# Patient Record
Sex: Female | Born: 1955
Health system: Southern US, Community
[De-identification: ages and names within clinical notes are randomized; demographics above are authoritative.]

## PROBLEM LIST (undated history)

## (undated) DIAGNOSIS — M51369 Other intervertebral disc degeneration, lumbar region without mention of lumbar back pain or lower extremity pain: Secondary | ICD-10-CM

## (undated) DIAGNOSIS — E119 Type 2 diabetes mellitus without complications: Secondary | ICD-10-CM

## (undated) DIAGNOSIS — K649 Unspecified hemorrhoids: Secondary | ICD-10-CM

## (undated) DIAGNOSIS — M5136 Other intervertebral disc degeneration, lumbar region: Secondary | ICD-10-CM

## (undated) DIAGNOSIS — R112 Nausea with vomiting, unspecified: Secondary | ICD-10-CM

## (undated) DIAGNOSIS — G8929 Other chronic pain: Secondary | ICD-10-CM

## (undated) DIAGNOSIS — Z860100 Personal history of colon polyps, unspecified: Secondary | ICD-10-CM

## (undated) DIAGNOSIS — Z9889 Other specified postprocedural states: Secondary | ICD-10-CM

## (undated) DIAGNOSIS — J45909 Unspecified asthma, uncomplicated: Secondary | ICD-10-CM

## (undated) DIAGNOSIS — E785 Hyperlipidemia, unspecified: Secondary | ICD-10-CM

## (undated) DIAGNOSIS — S83206A Unspecified tear of unspecified meniscus, current injury, right knee, initial encounter: Secondary | ICD-10-CM

## (undated) DIAGNOSIS — M1990 Unspecified osteoarthritis, unspecified site: Secondary | ICD-10-CM

## (undated) DIAGNOSIS — M549 Dorsalgia, unspecified: Secondary | ICD-10-CM

## (undated) DIAGNOSIS — K219 Gastro-esophageal reflux disease without esophagitis: Secondary | ICD-10-CM

## (undated) DIAGNOSIS — I10 Essential (primary) hypertension: Secondary | ICD-10-CM

## (undated) DIAGNOSIS — M797 Fibromyalgia: Secondary | ICD-10-CM

## (undated) HISTORY — PX: OTHER SURGICAL HISTORY: SHX169

---

## 1977-09-22 HISTORY — PX: TOTAL ABDOMINAL HYSTERECTOMY W/ BILATERAL SALPINGOOPHORECTOMY: SHX83

## 1999-11-12 ENCOUNTER — Other Ambulatory Visit: Admission: RE | Admit: 1999-11-12 | Discharge: 1999-11-12 | Payer: Self-pay | Admitting: *Deleted

## 2000-09-08 ENCOUNTER — Encounter: Admission: RE | Admit: 2000-09-08 | Discharge: 2000-09-08 | Payer: Self-pay | Admitting: *Deleted

## 2000-09-08 ENCOUNTER — Encounter: Payer: Self-pay | Admitting: *Deleted

## 2000-12-03 ENCOUNTER — Other Ambulatory Visit: Admission: RE | Admit: 2000-12-03 | Discharge: 2000-12-03 | Payer: Self-pay | Admitting: *Deleted

## 2001-03-22 ENCOUNTER — Encounter: Payer: Self-pay | Admitting: Cardiology

## 2001-03-22 ENCOUNTER — Encounter: Admission: RE | Admit: 2001-03-22 | Discharge: 2001-03-22 | Payer: Self-pay | Admitting: Cardiology

## 2001-10-05 ENCOUNTER — Encounter: Payer: Self-pay | Admitting: *Deleted

## 2001-10-05 ENCOUNTER — Encounter: Admission: RE | Admit: 2001-10-05 | Discharge: 2001-10-05 | Payer: Self-pay | Admitting: *Deleted

## 2001-10-11 ENCOUNTER — Ambulatory Visit (HOSPITAL_COMMUNITY): Admission: RE | Admit: 2001-10-11 | Discharge: 2001-10-11 | Payer: Self-pay | Admitting: Urology

## 2001-10-11 ENCOUNTER — Encounter: Payer: Self-pay | Admitting: Urology

## 2001-12-22 ENCOUNTER — Other Ambulatory Visit: Admission: RE | Admit: 2001-12-22 | Discharge: 2001-12-22 | Payer: Self-pay | Admitting: *Deleted

## 2002-10-19 ENCOUNTER — Ambulatory Visit (HOSPITAL_COMMUNITY): Admission: RE | Admit: 2002-10-19 | Discharge: 2002-10-19 | Payer: Self-pay | Admitting: Gastroenterology

## 2002-11-08 ENCOUNTER — Encounter: Admission: RE | Admit: 2002-11-08 | Discharge: 2002-11-08 | Payer: Self-pay | Admitting: *Deleted

## 2002-11-08 ENCOUNTER — Encounter: Payer: Self-pay | Admitting: *Deleted

## 2003-02-21 ENCOUNTER — Other Ambulatory Visit: Admission: RE | Admit: 2003-02-21 | Discharge: 2003-02-21 | Payer: Self-pay | Admitting: *Deleted

## 2004-01-15 ENCOUNTER — Encounter: Admission: RE | Admit: 2004-01-15 | Discharge: 2004-01-15 | Payer: Self-pay | Admitting: *Deleted

## 2004-03-28 ENCOUNTER — Other Ambulatory Visit: Admission: RE | Admit: 2004-03-28 | Discharge: 2004-03-28 | Payer: Self-pay | Admitting: *Deleted

## 2005-03-27 ENCOUNTER — Other Ambulatory Visit: Admission: RE | Admit: 2005-03-27 | Discharge: 2005-03-27 | Payer: Self-pay | Admitting: *Deleted

## 2005-06-03 ENCOUNTER — Ambulatory Visit (HOSPITAL_COMMUNITY): Admission: RE | Admit: 2005-06-03 | Discharge: 2005-06-03 | Payer: Self-pay | Admitting: Orthopedic Surgery

## 2005-06-03 ENCOUNTER — Ambulatory Visit (HOSPITAL_BASED_OUTPATIENT_CLINIC_OR_DEPARTMENT_OTHER): Admission: RE | Admit: 2005-06-03 | Discharge: 2005-06-03 | Payer: Self-pay | Admitting: Orthopedic Surgery

## 2006-01-16 ENCOUNTER — Encounter: Admission: RE | Admit: 2006-01-16 | Discharge: 2006-01-16 | Payer: Self-pay | Admitting: Cardiology

## 2006-01-30 ENCOUNTER — Ambulatory Visit (HOSPITAL_BASED_OUTPATIENT_CLINIC_OR_DEPARTMENT_OTHER): Admission: RE | Admit: 2006-01-30 | Discharge: 2006-01-30 | Payer: Self-pay | Admitting: Orthopedic Surgery

## 2006-01-30 HISTORY — PX: OTHER SURGICAL HISTORY: SHX169

## 2006-03-30 ENCOUNTER — Other Ambulatory Visit: Admission: RE | Admit: 2006-03-30 | Discharge: 2006-03-30 | Payer: Self-pay | Admitting: *Deleted

## 2006-10-14 ENCOUNTER — Ambulatory Visit (HOSPITAL_BASED_OUTPATIENT_CLINIC_OR_DEPARTMENT_OTHER): Admission: RE | Admit: 2006-10-14 | Discharge: 2006-10-14 | Payer: Self-pay | Admitting: Orthopedic Surgery

## 2006-10-14 HISTORY — PX: KNEE ARTHROSCOPY: SUR90

## 2006-12-18 ENCOUNTER — Observation Stay (HOSPITAL_COMMUNITY): Admission: EM | Admit: 2006-12-18 | Discharge: 2006-12-19 | Payer: Self-pay | Admitting: Emergency Medicine

## 2006-12-27 ENCOUNTER — Encounter: Admission: RE | Admit: 2006-12-27 | Discharge: 2006-12-27 | Payer: Self-pay | Admitting: Cardiology

## 2007-04-06 ENCOUNTER — Other Ambulatory Visit: Admission: RE | Admit: 2007-04-06 | Discharge: 2007-04-06 | Payer: Self-pay | Admitting: *Deleted

## 2007-07-07 ENCOUNTER — Ambulatory Visit (HOSPITAL_BASED_OUTPATIENT_CLINIC_OR_DEPARTMENT_OTHER): Admission: RE | Admit: 2007-07-07 | Discharge: 2007-07-07 | Payer: Self-pay | Admitting: Orthopedic Surgery

## 2007-07-07 HISTORY — PX: SHOULDER ARTHROSCOPY W/ SUBACROMIAL DECOMPRESSION AND DISTAL CLAVICLE EXCISION: SHX2401

## 2007-09-10 ENCOUNTER — Encounter: Admission: RE | Admit: 2007-09-10 | Discharge: 2007-09-10 | Payer: Self-pay | Admitting: Cardiology

## 2007-12-12 ENCOUNTER — Encounter: Admission: RE | Admit: 2007-12-12 | Discharge: 2007-12-12 | Payer: Self-pay | Admitting: Orthopedic Surgery

## 2008-01-10 HISTORY — PX: COLONOSCOPY W/ POLYPECTOMY: SHX1380

## 2008-04-24 ENCOUNTER — Other Ambulatory Visit: Admission: RE | Admit: 2008-04-24 | Discharge: 2008-04-24 | Payer: Self-pay | Admitting: Gynecology

## 2008-07-23 ENCOUNTER — Encounter: Admission: RE | Admit: 2008-07-23 | Discharge: 2008-07-23 | Payer: Self-pay | Admitting: Orthopedic Surgery

## 2008-07-26 ENCOUNTER — Encounter: Admission: RE | Admit: 2008-07-26 | Discharge: 2008-07-26 | Payer: Self-pay | Admitting: Orthopedic Surgery

## 2008-10-04 ENCOUNTER — Ambulatory Visit (HOSPITAL_COMMUNITY): Admission: RE | Admit: 2008-10-04 | Discharge: 2008-10-04 | Payer: Self-pay | Admitting: Urology

## 2008-10-16 ENCOUNTER — Encounter (INDEPENDENT_AMBULATORY_CARE_PROVIDER_SITE_OTHER): Payer: Self-pay | Admitting: Urology

## 2008-10-16 ENCOUNTER — Inpatient Hospital Stay (HOSPITAL_COMMUNITY): Admission: RE | Admit: 2008-10-16 | Discharge: 2008-10-19 | Payer: Self-pay | Admitting: Urology

## 2008-10-16 HISTORY — PX: ROBOTIC ASSITED PARTIAL NEPHRECTOMY: SHX6087

## 2010-08-07 ENCOUNTER — Encounter: Admission: RE | Admit: 2010-08-07 | Discharge: 2010-08-07 | Payer: Self-pay | Admitting: Cardiology

## 2011-01-06 LAB — BASIC METABOLIC PANEL
BUN: 10 mg/dL (ref 6–23)
CO2: 27 mEq/L (ref 19–32)
CO2: 30 mEq/L (ref 19–32)
Calcium: 8.4 mg/dL (ref 8.4–10.5)
Calcium: 8.8 mg/dL (ref 8.4–10.5)
Chloride: 103 mEq/L (ref 96–112)
Chloride: 104 mEq/L (ref 96–112)
Creatinine, Ser: 0.71 mg/dL (ref 0.4–1.2)
Creatinine, Ser: 0.81 mg/dL (ref 0.4–1.2)
Creatinine, Ser: 0.82 mg/dL (ref 0.4–1.2)
Creatinine, Ser: 0.85 mg/dL (ref 0.4–1.2)
GFR calc Af Amer: >60 mL/min (ref >60)
GFR calc Af Amer: >60 mL/min (ref >60)
GFR calc Af Amer: >60 mL/min (ref >60)
GFR calc non Af Amer: >60 mL/min (ref >60)
Glucose, Bld: 104 mg/dL — ABNORMAL HIGH (ref 70–99)
Glucose, Bld: 138 mg/dL — ABNORMAL HIGH (ref 70–99)
Glucose, Bld: 140 mg/dL — ABNORMAL HIGH (ref 70–99)
Potassium: 3.7 mEq/L (ref 3.5–5.1)
Sodium: 141 mEq/L (ref 135–145)

## 2011-01-06 LAB — GLUCOSE, CAPILLARY
Glucose-Capillary: 114 mg/dL — ABNORMAL HIGH (ref 70–99)
Glucose-Capillary: 131 mg/dL — ABNORMAL HIGH (ref 70–99)
Glucose-Capillary: 143 mg/dL — ABNORMAL HIGH (ref 70–99)
Glucose-Capillary: 149 mg/dL — ABNORMAL HIGH (ref 70–99)
Glucose-Capillary: 155 mg/dL — ABNORMAL HIGH (ref 70–99)
Glucose-Capillary: 166 mg/dL — ABNORMAL HIGH (ref 70–99)
Glucose-Capillary: 97 mg/dL (ref 70–99)

## 2011-01-06 LAB — CBC
Hemoglobin: 12.7 g/dL (ref 12.0–15.0)
MCV: 94.2 fL (ref 78.0–100.0)
RDW: 13.1 % (ref 11.5–15.5)
WBC: 6.3 10*3/uL (ref 4.0–10.5)

## 2011-01-06 LAB — ABO/RH: ABO/RH(D): O POS

## 2011-01-06 LAB — HEMOGLOBIN AND HEMATOCRIT, BLOOD
HCT: 29.7 % — ABNORMAL LOW (ref 36.0–46.0)
HCT: 30.2 % — ABNORMAL LOW (ref 36.0–46.0)
HCT: 34 % — ABNORMAL LOW (ref 36.0–46.0)
Hemoglobin: 10.1 g/dL — ABNORMAL LOW (ref 12.0–15.0)

## 2011-01-06 LAB — TYPE AND SCREEN

## 2011-01-06 LAB — CREATININE, FLUID (PLEURAL, PERITONEAL, JP DRAINAGE): Creat, Fluid: 1 mg/dL

## 2011-02-04 NOTE — Op Note (Signed)
[name], [name]               ACCOUNT NO.:  [id number]   MEDICAL RECORD NO.:  [id number]          PATIENT TYPE:  AMB   LOCATION:  DSC                          FACILITY:  [facility]   PHYSICIAN:  Mila Homer. Sherlean Foot, M.D. DATE OF BIRTH:  01/17/1956   DATE OF PROCEDURE:  07/07/2007  DATE OF DISCHARGE:                               OPERATIVE REPORT   SURGEON:  Mila Homer. Sherlean Foot, M.D.   ASSISTANT:  None.   ANESTHESIA:  General.   PREOPERATIVE DIAGNOSIS:  Left shoulder impingement syndrome.   POSTOPERATIVE DIAGNOSIS:  Right rotator cuff tear and labral tear, plus  acromioclavicular joint arthritis and impingement syndrome.   PROCEDURE:  Left shoulder scope, glenohumeral debridement, subacromial  decompression, distal clavicle resection and rotator cuff repair.   INDICATIONS FOR PROCEDURE:  The patient is a 55 year old black female  with failure of conservative measures, MRI evidence of partial-thickness  tearing of the rotator cuff and chronic impingement syndrome.  Informed  consent was obtained.   DESCRIPTION OF PROCEDURE:  The patient was laid supine and administered  general anesthesia and placed in beach-chair position.  Left shoulder  prepped and draped in usual sterile fashion.  Anterior-posterior direct  lateral portals were created with a #11 blade blunt trocar and cannula.  Diagnostic arthroscopy revealed no chondromalacia in the joint but a  degenerative labral tear.  This was debrided.  There was also tear of at  least one third of the biceps tendon with lots of fraying.  This was  debrided as well.  I then debrided the undersurface of the rotator cuff  and it was a very extensive tear concerning for a full-thickness tear  but I would further evaluate through the bursal side.  I then redirected  scope into the subacromial space from the posterior portal.  I performed  bursectomy and acromioplasty with a 4 mm cylindrical bur.  I removed  distal clavicle with 4 mm bur as  well.  I then debrided the rotator  cuff.  It was obvious there was actually quite large rotator cuff tear  at the interval.  There was wear on the biceps.  It was really an L-  shaped type tear.  I then converted to mini open technique by extending  the direct lateral portal up 3 cm.  Placed Arthrex shoulder retractor in  place.  Then placed two side-to-side sutures with #2 Vicryl sutures.  These were figure-of-eight sutures.  I then placed a 5.5 mm biocorkscrew  from Arthrex and placed a modified ONEOK bringing the leading edge  of the supraspinatus back down.  I then placed one additional stitch  from that edge to the anterior edge closing  the rotator cuff interval.  This afforded excellent repair, I then irrigated and closed the deltoid  interval with two figure-of-eight 0 Vicryl sutures, subcuticular with 2-  0 Vicryl sutures and then Steri-Strips and that incision, 4-0 nylon in  the portals.  I dressed with Xeroform dressing sponges, 2-inch silk tape  and a sling and swath.   COMPLICATIONS:  None.   DRAINS:  None.  ______________________________  Mila Homer Sherlean Foot, M.D.     SDL/MEDQ  D:  07/07/2007  T:  07/08/2007  Job:  [id number]

## 2011-02-04 NOTE — Discharge Summary (Signed)
[name], [name] NO.:  [id number]   MEDICAL RECORD NO.:  [id number]          PATIENT TYPE:  INP   LOCATION:  1436                         FACILITY:  [facility]   PHYSICIAN:  Heloise Purpura, MD      DATE OF BIRTH:  [birth year]   DATE OF ADMISSION:  10/16/2008  DATE OF DISCHARGE:  10/19/2008                               DISCHARGE SUMMARY   ADMISSION DIAGNOSIS:  Left renal mass.   DISCHARGE DIAGNOSIS:  Left renal oncocytoma.   PROCEDURE:  Left robotic-assisted laparoscopic partial nephrectomy.   HISTORY AND PHYSICAL EXAM:  For full details please see admission  history and physical.  Briefly, [name] is a 55 year old female who  has developed back pain and was evaluated for this with an MRI of the  spine.  She was incidentally found to have a small solid left renal mass  concerning  for possible renal malignancy.  She underwent further  evaluation with a definitive renal MRI which did demonstrate enhancement  of this lesion that was concerning  for renal cell carcinoma.  After  discussion regarding management options for treatment of her left renal  mass, she elected to proceed with a minimally invasive, nephron-sparing  surgical approach.   HOSPITAL COURSE:  On October 16, 2008 [name] was taken to the  operating room where she underwent a robotic-assisted laparoscopic left  partial nephrectomy.  This procedure was tolerated well and without  complication.  Postoperatively she was able to be transferred to a  regular hospital room, where she was monitored overnight and remained  hemodynamically stable.  Her hematocrit was 34.  She was maintained on  strict bedrest for 24 hours.  Her kidney function remained normal  postoperatively as evidenced by her creatinine of 0.86.  she began  ambulating after bedrest for 24 hours which she did without difficulty.  She was also transitioned to her oral pain medications.  She maintained  excellent urine output for  the first 24 hours after surgery.  Therefore,  the Foley catheter was removed.  She had minimal output from the  perinephric drain.  Fluid from this drain was sent on postoperative day  #2 to check for creatinine level and found to be consistent with serum  at 1.  Fluid load did increase on postoperative day #2 from the  perinephric drain.  Therefore, another sample as sent to check for  creatinine level and again came back to be consistent with serum at 0.9.  She was able to begin a clear liquid diet on postoperative day #1 which  she tolerated well in the a.m. Afterwards for the next 2 postoperative  days she did have somewhat of a postoperative ileus.  She was treated  for  this with suppositories, Reglan, Milk of Magnesia, and enemas.  She  did eventually have a small emesis and then began passing flatus. At  this time she was able to advance her diet and was able to tolerate the  diet without any further complications.   Postoperatively she was found to be slightly hypokalemic.  Therefore,  potassium replacement was  begun,  and this was ultimately resolved.  She  was placed on sliding scale insulin for her diabetes mellitus, which is  currently diet controlled.   On postoperative day #3 she was tolerating her diet, ambulating without  difficulty, and her pain was well controlled.  Therefore, she was able  to be discharged home in excellent condition.   DISPOSITION:  Home.   DISCHARGE MEDICATIONS:  She was given a prescription of Vicodin to use  as needed for pain and told to use Colace as a stool softener.  She was  instructed to hold all herbal supplements, multivitamins, and aspirin  for 10 days postoperatively.   DISCHARGE INSTRUCTIONS:  She was instructed to be ambulatory but  specifically told to refrain from any heavy lifting, strenuous activity,  or driving.  She was instructed to gradually resume her diet.   FOLLOW UP:  She will return in 10-14 days for further  postoperative  evaluation in the office.   PATHOLOGY:  Her pathology returned as a benign renal oncocytoma.  The  pathology results were discuss with the patient that this was a benign  lesion.      Delia Chimes, NP      Heloise Purpura, MD  Electronically Signed    MA/MEDQ  D:  10/19/2008  T:  10/19/2008  Job:  [id number]

## 2011-02-04 NOTE — Op Note (Signed)
[name], KOFOED NO.:  [id number]   MEDICAL RECORD NO.:  [id number]          PATIENT TYPE:  INP   LOCATION:  0010                         FACILITY:  [facility]   PHYSICIAN:  Heloise Purpura, MD      DATE OF BIRTH:  May 26, 1956   DATE OF PROCEDURE:  10/16/2008  DATE OF DISCHARGE:                               OPERATIVE REPORT   PREOPERATIVE DIAGNOSIS:  Left renal mass.   POSTOPERATIVE DIAGNOSIS:  Left renal mass.   PROCEDURE:  Left robotic-assisted laparoscopic partial nephrectomy.   FIRST ASSISTANT:  Bertram Millard. Dahlstedt, M.D.   SECOND ASSISTANT:  Delia Chimes, nurse practitioner.   ANESTHESIA:  General.   COMPLICATIONS:  None.   ESTIMATED BLOOD LOSS:  450 mL.   INTRAVENOUS FLUIDS:  2600 mL of lactated Ringer's.   WARM ISCHEMIA TIME:  26 minutes.   SPECIMENS:  1. Perinephric fat.  2. Left renal mass.  3. Tumor margin.   INTRAOPERATIVE FINDINGS:  Tumor margin was negative for malignancy.   DISPOSITION OF SPECIMENS:  To pathology.   DRAINS:  1. 16-French Foley catheter.  2. A #15 Blake perinephric drain.   INDICATION:  [name] is a 55 year old female who developed back pain  and was evaluated for this with an MRI of the spine.  She was  incidentally found to have a small solid left renal mass concerning for  possible renal malignancy.  She underwent further evaluation with a  definitive renal MRI which did demonstrate enhancement of this lesion  that was concerning for a renal cell carcinoma.  Her metastatic  evaluation was negative.  After discussion regarding management options  for treatment of her renal mass, she elected to proceed with a minimally  invasive nephron-sparing surgical approach.  The potential risks,  complications, and alternative options associated with the above  procedure were discussed in detail with the patient and informed consent  was obtained.   DESCRIPTION OF PROCEDURE:  The patient was taken to the operating  room  and a general anesthetic was administered.  She was given preoperative  antibiotics, placed in the left modified flank position, and prepped and  draped in the usual sterile fashion.  Care was taken to pad all  potential pressure points.  A preoperative time-out was performed.  A  site was then selected to the left of the umbilicus for placement of the  camera port.  This was placed using a standard open Hassan technique  which allowed entry into the peritoneal cavity under direct vision and  without difficulty.  A 12-mm port was then placed and a pneumoperitoneum  established.  The 0-degree lens was first used to inspect the abdomen  and there was no evidence for any intra-abdominal injuries or other  abnormalities.  The remaining ports were then placed.  Two 8-mm robotic  ports were placed in the left lower quadrant.  An 8-mm robotic port was  placed in the left upper quadrant.  A 12-mm port was placed in the upper  midline for laparoscopic assistance.  All ports were placed under direct  vision and without difficulty.  The surgical cart was then docked.  With  a combination of sharp and cautery dissection, the white line of Toldt  was incised along the length of the descending colon, allowing the colon  to be mobilized medially and the space between the mesocolon and the  anterior layer of Gerota's fascia to be developed.  The ureter was  exposed as dissection proceeded medially and the ureter was able to be  lifted off of the psoas muscle along with the kidney and perinephric  fat.  Dissection then proceeded superiorly and the lower pole renal  artery was encountered.  In addition, 2 large renal veins were  identified.  Between these veins, a single renal artery was identified  consistent with the patient's solitary renal artery on his MRI.  It  therefore appeared on intraoperative dissection that there were 2 renal  arteries with this lower pole branch also being identified.   The kidney  was then mobilized as the perinephric fat was separated off the renal  capsule.  The patient's lower pole lesion was readily identified on the  lateral aspect of the kidney and the kidney was rotated medially.  Once  the kidney had been adequately freed from the surrounding perinephric  attachments, preparations were made for resection.  Intravenous mannitol  12.5 g was administered.  The renal parenchyma was scored with cautery  around the lesion and the kidney was further mobilized so that the  lesion was felt to be in an ideal location for resection.  Once the  mannitol had been administered for over 10 minutes, and appropriate  bolsters and sutures placed into the abdomen, bulldog clamps were used  to clamp each of the aforementioned renal arteries.  Using cold scissor  dissection, the renal tumor was then excised with resection deep into  the renal parenchyma.  A margin from the deep base of the resection was  sent for frozen section analysis and returned negative for malignancy  and was consistent with normal medullary tissue.  There was noted to be  some excessive bleeding during the resection, raising the possibility of  poor vascular control.  This was controlled with 2-0 Vicryl running  sutures which were secured with Lapra-Ty.  This required two 2-0 Vicryl  running sutures to obtain vascular control.  At this point 5 mL of  FloSeal was placed into the renal defect with a Surgicel bolster over  the defect.  A double-armed 2-0 Monocryl suture was then brought through  the renal capsule and used to secure the Surgicel bolster in place with  adequate compression of the renal parenchyma and defect.  These  Monocryls were then secured with Hem-o-lok clips, allowing the Monocryl  to be cinched down in further compression of the renal parenchyma.  Lapra-Tys were then placed over the Hem-o-lok clips to secure the  Monocryl in place.  All needles were removed from the abdomen.   At this  point, vascular control appeared to be adequate.  The laparoscopic  bulldog clamps were then removed.  Total warm ischemia time was 26  minutes.  An additional 12.5 g of intravenous mannitol was then  administered.  The kidney and perinephric space was then reexamined and  hemostasis continued to appear excellent at this point.  A #15 Blake  drain was brought through the far left lateral lower quadrant port and  placed into the perinephric space and secured to the skin with a nylon  suture.  The perinephric fat specimen had been removed  earlier and was  removed off the top of the renal tumor.  The actual renal tumor was now  placed into an Endopouch retrieval bag for removal from the abdomen.  The pneumoperitoneum was let down and hemostasis continued to appear  excellent.  The surgical cart was then undocked.  The 12-mm laparoscopic  assistance port in the upper midline was closed with a 0 Vicryl suture  placed with the suture passer device.  All remaining ports were removed  under direct vision.  The specimen was then removed intact in the  Endopouch retrieval bag via the original camera port site.  This fascial  opening was then closed with a running 0 Vicryl suture.  All port sites  were injected with 0.25% Marcaine and  reapproximated at the skin level with staples.  Sterile dressings were  applied.  The patient appeared to tolerate the procedure well and  without complications.  She was able to be extubated and transferred to  recovery unit in satisfactory condition.      Heloise Purpura, MD  Electronically Signed     LB/MEDQ  D:  10/16/2008  T:  10/16/2008  Job:  [id number]

## 2011-02-07 NOTE — Discharge Summary (Signed)
[name], [name]               ACCOUNT NO.:  [id number]   MEDICAL RECORD NO.:  [id number]          PATIENT TYPE:  INP   LOCATION:  4735                         FACILITY:  [facility]   PHYSICIAN:  Mohan N. Sharyn Lull, M.D. DATE OF BIRTH:  10/22/1955   DATE OF ADMISSION:  12/18/2006  DATE OF DISCHARGE:  12/19/2006                               DISCHARGE SUMMARY   ADMITTING DIAGNOSES:  1. Atypical chest pain, palpitation, rule out coronary insufficiency,      rule out cardiac arrhythmias.  2. Hypertension.  3. Hypercholesterolemia.  4. History of tobacco abuse.  5. Positive family history of coronary artery disease.  6. Gastroesophageal reflux disease.  7. Hiatus hernia.  8. History of mitral valve prolapse.  9. History of irritable bowel syndrome.   DISCHARGE DIAGNOSES:  1. Status post atypical chest pain, negative Persantine Myoview.  2. Hypertension.  3. Hypercholesterolemia.  4. History of tobacco abuse.  5. Positive family history of coronary artery disease.  6. Gastroesophageal reflux disease.  7. Hiatus hernia.  8. History of mitral valve prolapse.  9. History of irritable bowel syndrome.   DISCHARGE MEDICATIONS:  1. Enteric-coated aspirin 81 mg one tablet daily.  2. Norvasc  5 mg tablet daily.  3. Os-Cal 1 tablet daily, as before.  4. Multivitamin 1 tablet daily as before.  5. Estrogen 1 tablet daily as before.  6. Pravachol 40 mg 1 tablet daily as before.  7. Nitrostat 0.4 mg use as directed.   DIET:  Low-salt, low-cholesterol.   FOLLOWUP:  Followup with Dr. Shana Chute in 1 week.   ACTIVITY:  As tolerated.   CONDITION ON DISCHARGE:  Stable.   BRIEF HISTORY AND HOSPITAL COURSE:  [name] is a 55 year old black  female with past medical history significant for hypertension,  hypercholesterolemia, a remote history of tobacco abuse, history of  hiatus hernia, GERD, IBS, history of mitral valve prolapse, positive  family history of coronary artery disease, history  of thyroid goiter.  She came to the ER complaining of retrosternal and left-sided chest pain  lasting a few minutes.  They are 5/10, sharp, without associated  symptoms.  Two days ago and last night, she felt palpitation associated  with dizziness and numbness in the left thumb and left leg pain.  The  patient denies chest pain today.  Denies history of neck trauma or  accident.  Denies external chest pain, but complains of exertional  dyspnea.  The patient states the patient had a stress test many years  ago and was negative.  Denies any cough, fever, or chills.  Denies  abdominal pain.   PAST MEDICAL HISTORY:  As above.   PAST SURGICAL HISTORY:  She had right knee arthroscopic surgery in  January 2008.  Had right elbow surgery in the past.   ALLERGIES:  SHE IS ALLERGIC TO PENICILLIN, FLEXERIL, AND PERCOCET.   MEDICATIONS:  At home she takes:  1. Hyzaar 50/12.5 p.o. daily.  2. Pravachol 20 p.o. daily.  3. Ibuprofen p.r.n.  4. Estrogen.   SOCIAL HISTORY:  She is married and has one child.  Works for  a [employer].  Smoked one pack per day for 6 years, quit 15 years  ago.  Drinks wine socially on the weekend.   FAMILY HISTORY:  Mother died of myocardial infarction at the age of 29.  Father died of pneumonia at the age of 7.  One brother has hypertension  and sister has muscular problems.   PHYSICAL EXAMINATION:  GENERAL:  She was awake, alert, and oriented x3.  In no acute distress.  VITAL SIGNS:  Blood pressure was 143/86, pulse 62 and regular.  HEENT:  Conjunctivae pink.  NECK:  Supple.  No JVD, no bruits.  LUNGS:  Clear to auscultation without rhonchi or rubs.  CARDIAC:  S1, S2 was normal.  There was a soft systolic murmur.  ABDOMEN:  Soft.  Bowel sounds present, nontender.  No mass or  organomegaly.  EXTREMITIES:  No cyanosis, clubbing, or edema.   IMAGING:  EKG showed normal sinus rhythm with poor R-wave progression in  V1 to V3.   LABORATORY DATA:   Cholesterol was 145, LDL 103, HDL 35, triglyceride 37,  sodium 140, potassium 3.6, chloride 105, bicarb 27, glucose 102, BUN 14,  creatinine 0.78.  Repeat fasting blood sugar on December 19, 2006, was 99.  Two sets of cardiac enzymes and troponin I were negative.  Hemoglobin  was 13.1, hematocrit 38.8, white count 7.5.   Persantine Myoview done on March 29, showed no evidence of ischemia or  infarct with an EF of 64%.   BRIEF HISTORY AND HOSPITAL COURSE:  The patient was admitted to  telemetry unit and was ruled out by serial enzymes and EKG.  The patient  subsequently underwent Persantine Myoview which showed no evidence of  ischemia with an EF of 64%.  The patient did not have any episode of  chest pain, but had left arm numbness during  the hospital stay.  The patient was discharged home and the patient will  be scheduled for an MRI of the neck as outpatient by Dr. Shana Chute.  If  she develops further numbness or weakness in the arm, the patient was  advised to go to the ER.           ______________________________  Eduardo Osier. Sharyn Lull, M.D.     MNH/MEDQ  D:  01/29/2007  T:  01/30/2007  Job:  [id number]   cc:   Osvaldo Shipper. Spruill, M.D.

## 2011-02-07 NOTE — Op Note (Signed)
NAMETEODORA, BAUMGARTEN               ACCOUNT NO.:  [id number]   MEDICAL RECORD NO.:  [id number]          PATIENT TYPE:  AMB   LOCATION:  DSC                          FACILITY:  [facility]   PHYSICIAN:  Katy Fitch. Sypher, M.D. DATE OF BIRTH:  05-Aug-1956   DATE OF PROCEDURE:  06/03/2005  DATE OF DISCHARGE:                                 OPERATIVE REPORT   PREOPERATIVE DIAGNOSES:  Chronic right lateral epicondylitis, unresponsive  to 18 months of nonoperative management.  With plain film evidence of  significant reactive bone formation at the right humeral lateral epicondyle,  and MRI evidence of severe chronic tendinopathy of common extensor origin.   POSTOPERATIVE DIAGNOSES:  Chronic right lateral epicondylitis, unresponsive  to 18 months of nonoperative management.  With plain film evidence of  significant reactive bone formation at the right humeral lateral epicondyle,  and MRI evidence of severe chronic tendinopathy of common extensor origin.   OPERATION:  1.  Reconstruction of right elbow common extensor origin, with excision of      reactive bone ridge.  2.  Multiple drilling of lateral epicondyle.  3.  Reattachment of extensor carpi radialis longus and extensor carpi      radialis brevis tendon origins; with through-bone suture technique.   OPERATING SURGEON:  Katy Fitch. Sypher, M.D.   ASSISTANT:  Molly Maduro Dasnoit PA-C.   ANESTHESIA:  General by LMA.   SUPERVISED ANESTHESIOLOGIST:  Bedelia Person, M.D.   INDICATIONS:  Lynden Ang Wiebelhaus is a 55 year old right-hand dominant woman, with  a history of an on-the-job injury in February 2005. She has been evaluated  by the [facility], as well as  Dr. Chaney Malling.  She was diagnosed with chronic right lateral epicondylitis  and radial tunnel syndrome.   She has had a series of steroid injections into the region of the right  lateral epicondyle, with subsequent development of a chronic pain  predicament unresponsive to  nonoperative measures.   She was advised to consider release of her common extensor origin and radial  tunnel decompression.   She was brought for a second opinion regarding her elbow predicament to  orthopedic and hand specialists. On clinical examination, she was noted have  a chronic epicondylitis. I could not distinguish features of classic radial  tunnel syndrome. An MRI of the elbow and radial tunnel region was obtained,  which documented significant tendinopathy of the common extensor origin and  a normal-appearing radial nerve -- with no mass lesions near the superficial  radial sensory branches nor the posterior interosseous nerve branches.   We recommended an alternative treatment scheme consisting of:  debridement  of her reactive bone, debridement of her areas of chronic tendinopathy, and  reconstruction of her common extensor origin.   Ms. Ishii has elected to proceed with tendon origin reconstruction at this  time.   PROCEDURE:  Vicky Fooks was brought to the operating room and placed in the  supine position on the operating room table.   Following an informed consent with myself and Dr. Gypsy Balsam in the holding area,  general anesthesia was induced under the direct  supervision of Dr. Gypsy Balsam.  The right arm was prepped with Betadine soapy solution and sterilely draped.  A pneumatic tourniquet was applied to the proximal brachium. Ancef 1 gram  was administered as an IV prophylactic antibiotic.   Following exsanguination of the right arm with an Esmarch bandage, the  arterial tourniquet was inflated to 220 mmHg on the proximal right brachium.   Procedure commenced with a curvilinear incision posterior to the epicondyle,  paralleling the path of the anterior anconeus. Subcutaneous tissues were  carefully divided, releasing the fascia overlying the common extensor  origin. The extensor carpi radialis longus and brevis muscles were elevated  off the epicondyle, revealing a  3 mm-tall reactive bone formation at the  distal epicondyle. There was considerable tendinosis noted in the extensor  carpi radialis brevis, with a grayish discoloration and loss of the normal  integrity of the tendon, with a mop-like appearance of the tendon origin.   Areas of necrotic tendon were debrided, followed by drilling of the  epicondyle with a 0.025-inch Kirschner wire approximately 30x, followed by  contouring of the epicondyle with removal of the reactive bone spur.   The tendon origin was then reconstructed with mattress sutures of 3-0  FiberWire with through-bone McLaughlin suture technique, reapproximating the  common extensor origin anatomically to the multiple-drilled bed of bone.  An  anatomic repair of the tendon was achieved.   Thereafter, the fascia was repaired with interrupted suture of 4-0 Vicryl  for cosmetic closure, followed by repair of the skin with subdermal sutures  of 4-0 Vicryl and intradermal 3-0 Prolene with Steri-Strips.   Lidocaine 2% was infiltrated for postoperative analgesia, followed by  dressing of the elbow with Tegaderm and a sterile gauze.  A volar plaster  splint was applied at the wrist, maintaining the wrist at 40 degrees  dorsiflexion. This was completed with an Ace wrap compression and a 4-inch  Ace wrap was applied to the elbow.   There were no apparent complications.  Ms. Popson tolerated the surgery and  anesthesia well. She was transferred to the recovery with stable vital  signs.   She will be discharged to the care of her husband, with prescriptions for  Dilaudid 2 mg (30 tablets) 1-2 p.o. q. 4-6 hours p.r.n. pain;  also Motrin  600 mg 1 p.o. q.6 h. p.r.n. pain, and Keflex 500 mg 1 p.o. q.8 h x4 days as  a prophylactic antibiotic.      Katy Fitch Sypher, M.D.  Electronically Signed     RVS/MEDQ  D:  06/03/2005  T:  06/03/2005  Job:  [id number]  cc:   Osvaldo Shipper. Spruill, M.D.  Fax: [phone]

## 2011-02-07 NOTE — Op Note (Signed)
[name], [name]               ACCOUNT NO.:  [id number]   MEDICAL RECORD NO.:  [id number]          PATIENT TYPE:  AMB   LOCATION:  DSC                          FACILITY:  [facility]   PHYSICIAN:  Mila Homer. Sherlean Foot, M.D. DATE OF BIRTH:  08-Nov-1955   DATE OF PROCEDURE:  10/14/2006  DATE OF DISCHARGE:                               OPERATIVE REPORT   SURGEON:  Mila Homer. Sherlean Foot, M.D.   ASSISTANT:  None.   ANESTHESIA:  MAC.   PREOPERATIVE DIAGNOSIS:  Right knee medial meniscus tear, lateral  meniscus tear, plica.   POSTOPERATIVE DIAGNOSIS:  Right knee medial meniscus tear, lateral  meniscus tear, plica.   PROCEDURE:  Right knee arthroscopy with partial medial meniscectomy,  partial lateral meniscectomy, and plicectomy.   INDICATIONS FOR PROCEDURE:  The patient is a 55 year old black female  with failure of conservative measures and mechanical symptoms consistent  with meniscal tearing and MRI evidence of a meniscal tearing.  Informed  consent obtained.   DESCRIPTION OF PROCEDURE:  The patient was taken to the operating room  and administered MAC anesthesia.  The right lower extremity was prepped  and draped in the usual sterile fashion.  Inferolateral and inferomedial  portals were created with a #11 blade, blunt trocar, and cannula.  Diagnostic arthroscopy revealed no chondromalacia in the knee really at  all, a little bit of grade 1 in the medial compartment.  ACL and PCL  were normal.  There was a large synovitic plica in the medial  parapatellar joint impinging in the patellofemoral joint as it tracked.  I performed a plicectomy with the [employer] shaver and used the  [employer] TurboVac to obtain hemostasis of bleeding vessels in the  plica.  I then went into the medial compartment.  There was a complex  tear of the posterior horn of the medial meniscus.  This was debrided  with a [place] shaver and straight basket forceps.  I then went into  the lateral compartment and  there was also several small radial tears.  This was cleaned up with a straight basket forceps and a Conservation officer, nature.  I then lavaged the knee, closed with 4-0 nylon suture, dressed  with Xeroform dressing, sponge, and sterile Webril, and Ace wrap.   COMPLICATIONS:  None.   DRAINS:  None.           ______________________________  Mila Homer. Sherlean Foot, M.D.    SDL/MEDQ  D:  10/14/2006  T:  10/14/2006  Job:  [id number]

## 2011-02-07 NOTE — Op Note (Signed)
NAMEASSATA, JUNCAJ               ACCOUNT NO.:  [id number]   MEDICAL RECORD NO.:  [id number]          PATIENT TYPE:  AMB   LOCATION:  DSC                          FACILITY:  [facility]   PHYSICIAN:  Katy Fitch. Sypher, M.D. DATE OF BIRTH:  [birth year]   DATE OF PROCEDURE:  01/30/2006  DATE OF DISCHARGE:                                 OPERATIVE REPORT   PREOPERATIVE DIAGNOSIS:  Chronic stenosing tenosynovitis, right long and  small fingers; unresponsive to nonoperative measures including steroid  injection.   POSTOPERATIVE DIAGNOSIS:  Chronic stenosing tenosynovitis, right long and  small fingers; unresponsive to nonoperative measures including steroid  injection.   OPERATION:  1.  Release of right long finger A1 pulley.  2.  Release of right small finger A1 pulley.   OPERATING SURGEON:  Josephine Igo, M.D.   ASSISTANT:  Annye Rusk, P.A.-C.   ANESTHESIA:  Is general sedation/0.25% Marcaine and 2% lidocaine metacarpal  head level block of right long and small fingers. Supervising  anesthesiologist is Dr. Jacklynn Bue.   INDICATIONS:  [name] is a 55 year old woman who has been followed,  following reconstruction of her right elbow common extensor origin.  She has  recently developed triggering of her right long and small fingers.  This  have been treated with steroid injection and splinting without relief.   She requests release of her A1 pulleys for relief of her chronic discomfort.   After informed consent, she is brought to the operating room at this time.   PROCEDURE:  Vicky Blackwell is brought to the operating room and placed in  supine position on the operating table.   Following light sedation, the right arm was prepped with Betadine soap  solution and sterilely draped.  A pneumatic tourniquet was applied in the  proximal right brachium.   Quarter percent Marcaine and 2% lidocaine were infiltrated to the path of  the intended incisions and into the flexor sheath of  the right long and  right small fingers.   When anesthesia was satisfactory, the right hand and arm were exsanguinated  with an Esmarch bandage and an arterial tourniquet on the proximal brachium  inflated to 220 mmHg.  Procedure commenced with an oblique incision in the  distal palmar crease, exposing A1 pulley of the long finger.  The  neurovascular bundles were retracted.  The pulley was split longitudinally  and the tendons delivered.  No pathologic synovitis was noted.   A similar oblique incision was fashioned following Bruner incision lines,  exposing the A1 pulley of the small finger.   Subcutaneous tissue was carefully divided revealing the A1 pulley.   There was synovitis noted proximal to the A1 pulley.  The A1 pulley was  split longitudinally and the tendons delivered.   There was an anatomic variant with a normal-appearing superficialis and a  third accessory slip of the superficialis.   The accessory slip was resected.  A synovectomy was performed of  superficialis tendons.  The wounds were then irrigated and repaired with  interrupted suture of 5-0 nylon.   There was dressed with Xeroflo sterile gauze and  an Ace wrap.  There no  apparent complications.   Ms. Coen was awakened from her sedation and transferred to the recovery  room with stable vital signs.      Katy Fitch Sypher, M.D.  Electronically Signed     RVS/MEDQ  D:  01/30/2006  T:  01/31/2006  Job:  [id number]

## 2011-02-07 NOTE — Op Note (Signed)
[name], [name]               ACCOUNT NO.:  [id number]   MEDICAL RECORD NO.:  [id number]          PATIENT TYPE:  AMB   LOCATION:  DSC                          FACILITY:  [facility]   PHYSICIAN:  Mila Homer. Sherlean Foot, M.D. DATE OF BIRTH:  [birth year]   DATE OF PROCEDURE:  10/14/2006  DATE OF DISCHARGE:  10/14/2006                               OPERATIVE REPORT   SURGEON:  Mila Homer. Sherlean Foot, M.D.   ASSISTANT:  None.   ANESTHESIA:  MAC.   PREOPERATIVE DIAGNOSIS:  Right knee medial meniscus tear and  osteoarthritis.   POSTOPERATIVE DIAGNOSIS:  Right knee medial meniscus tear and  osteoarthritis.   PROCEDURE:  Right knee arthroscopy with partial medial meniscectomy and  chondroplasty.   INDICATIONS FOR PROCEDURE:  Patient is a 55 year old with MRI of  interpreting meniscus tear and radiograph evidence of some  osteoarthritis.  Informed consent was obtained.   DESCRIPTION OF PROCEDURE:  The patient was laid supine, administered MAC  anesthesia.  The right leg was prepped and draped in the usual sterile  fashion.  Inferolateral and inferomedial portals were created with #11  blade, blunt trocar and cannula.  Diagnostic arthroscopy revealed some  arthritis in the patellofemoral joint and the medial compartment.  A  posterolateral medial meniscus tear was identified with the probe.  Straight basket forceps and the [employer] shaver were used to perform  partial posterolateral medial meniscectomy and a chondroplasty in that  compartment.  I then went into the lateral compartment which looked  good.  I then went into extension and performed a chondroplasty in the  patellofemoral compartment as well, as well as removing some of the  large peripatellar plica.  I then lavaged and closed with 4-0 nylon  sutures, dressed with a Xeroform dressing, sponge, sterile Webril and an  Ace wrap.   COMPLICATIONS:  None.   DRAINS:  None.           ______________________________  Mila Homer. Sherlean Foot,  M.D.     SDL/MEDQ  D:  11/30/2006  T:  11/30/2006  Job:  [id number]

## 2011-02-07 NOTE — Op Note (Signed)
[name], [name]                         ACCOUNT NO.:  [id number]   MEDICAL RECORD NO.:  [id number]                   PATIENT TYPE:  AMB   LOCATION:  ENDO                                 FACILITY:  [facility]   PHYSICIAN:  Bernette Redbird, M.D.                DATE OF BIRTH:  25-Dec-1955   DATE OF PROCEDURE:  10/19/2002  DATE OF DISCHARGE:                                 OPERATIVE REPORT   PROCEDURE:  Colonoscopy.   INDICATIONS:  This is a 55 year old female with a history of abdominal  cramps and rectal bleeding transiently, with a family history of colon  cancer in a paternal aunt and heme-negative stool when checked in the  office.   FINDINGS:  Moderate sigmoid muscular thickening.   DESCRIPTION OF PROCEDURE:  The nature, purpose, and risks of the procedure  had been discussed with the patient, who provided written consent.  Sedation  was fentanyl 125 mcg and Versed 13 mg IV without arrhythmias or  desaturation.  The Olympus adjustable-tension pediatric video colonoscope  was advanced with moderate difficulty through a very stiff sigmoid region  with a lot of muscular thickening and some degree of angulation, but then  quite easily the remainder of the way around the colon to the cecum as  identified by visualization of the appendiceal orifice.  Pullback was then  performed.  The quality of the prep was excellent, and it is felt that all  areas were well-seen.   This was a normal examination except for some significant muscular  thickening in the sigmoid region, perhaps with some early diverticular  change in that location.  There was no evidence of extensive diverticulosis.  No polyps, cancer, colitis, vascular malformations, or frank diverticular  disease were observed.  Retroflexion could not be accomplished in the rectum  due to a small ampulla, but careful antegrade viewing disclosed no distal  rectal lesions.   Although the patient had had passage of blood and mucus  and abdominal  cramps, there was no evidence of any loss of vascularity or other markers of  chronic colitis.   No biopsies were obtained.   Pull-out through the anal canal showed mild internal hemorrhoids.   The patient tolerated the procedure quite well, and there were no apparent  complications.    IMPRESSION:  Essentially normal colonoscopy.  No source of recent cramps,  diarrhea, and passage of blood and mucus evident.  Perhaps the patient had  an infectious process which has resolved, since her symptoms had resolved  and she was Hemoccult-negative at the time of her office visit two weeks  ago.   PLAN:  Screening colonoscopy in five years in view of the family history of  colon cancer in her aunt.  Bernette Redbird, M.D.    RB/MEDQ  D:  10/19/2002  T:  10/19/2002  Job:  [id number]   cc:   Osvaldo Shipper. Spruill, M.D.  [address] [id number]  [place]  [place] [address]  Fax: [phone]   Almedia Balls. Fore, M.D.  [phone] [address] [place]  [place] [address]  Fax: [phone]

## 2011-03-06 ENCOUNTER — Other Ambulatory Visit: Payer: Self-pay | Admitting: Orthopedic Surgery

## 2011-03-06 DIAGNOSIS — M25562 Pain in left knee: Secondary | ICD-10-CM

## 2011-03-11 ENCOUNTER — Ambulatory Visit
Admission: RE | Admit: 2011-03-11 | Discharge: 2011-03-11 | Disposition: A | Discharge status: Home / Self Care | Payer: BC Managed Care – PPO | Source: Ambulatory Visit | Attending: Orthopedic Surgery | Admitting: Orthopedic Surgery

## 2011-03-11 DIAGNOSIS — M25562 Pain in left knee: Secondary | ICD-10-CM

## 2011-06-09 ENCOUNTER — Encounter (HOSPITAL_BASED_OUTPATIENT_CLINIC_OR_DEPARTMENT_OTHER)
Admission: RE | Admit: 2011-06-09 | Discharge: 2011-06-09 | Disposition: A | Discharge status: Home / Self Care | Payer: BC Managed Care – PPO | Source: Ambulatory Visit | Attending: Orthopedic Surgery | Admitting: Orthopedic Surgery

## 2011-06-09 LAB — BASIC METABOLIC PANEL
CO2: 28 mEq/L (ref 19–32)
Calcium: 9.2 mg/dL (ref 8.4–10.5)
Chloride: 103 mEq/L (ref 96–112)
Sodium: 138 mEq/L (ref 135–145)

## 2011-06-12 ENCOUNTER — Ambulatory Visit (HOSPITAL_BASED_OUTPATIENT_CLINIC_OR_DEPARTMENT_OTHER)
Admission: RE | Admit: 2011-06-12 | Discharge: 2011-06-12 | Disposition: A | Discharge status: Home / Self Care | Payer: BC Managed Care – PPO | Source: Ambulatory Visit | Attending: Orthopedic Surgery | Admitting: Orthopedic Surgery

## 2011-06-12 DIAGNOSIS — M7710 Lateral epicondylitis, unspecified elbow: Secondary | ICD-10-CM | POA: Insufficient documentation

## 2011-06-12 DIAGNOSIS — Z01812 Encounter for preprocedural laboratory examination: Secondary | ICD-10-CM | POA: Insufficient documentation

## 2011-06-12 DIAGNOSIS — M65849 Other synovitis and tenosynovitis, unspecified hand: Secondary | ICD-10-CM | POA: Insufficient documentation

## 2011-06-12 DIAGNOSIS — J4489 Other specified chronic obstructive pulmonary disease: Secondary | ICD-10-CM | POA: Insufficient documentation

## 2011-06-12 DIAGNOSIS — Z87891 Personal history of nicotine dependence: Secondary | ICD-10-CM | POA: Insufficient documentation

## 2011-06-12 DIAGNOSIS — M65839 Other synovitis and tenosynovitis, unspecified forearm: Secondary | ICD-10-CM | POA: Insufficient documentation

## 2011-06-12 DIAGNOSIS — E669 Obesity, unspecified: Secondary | ICD-10-CM | POA: Insufficient documentation

## 2011-06-12 DIAGNOSIS — J449 Chronic obstructive pulmonary disease, unspecified: Secondary | ICD-10-CM | POA: Insufficient documentation

## 2011-06-12 DIAGNOSIS — K219 Gastro-esophageal reflux disease without esophagitis: Secondary | ICD-10-CM | POA: Insufficient documentation

## 2011-06-12 DIAGNOSIS — Z01810 Encounter for preprocedural cardiovascular examination: Secondary | ICD-10-CM | POA: Insufficient documentation

## 2011-06-12 DIAGNOSIS — I10 Essential (primary) hypertension: Secondary | ICD-10-CM | POA: Insufficient documentation

## 2011-06-12 DIAGNOSIS — M6530 Trigger finger, unspecified finger: Secondary | ICD-10-CM | POA: Insufficient documentation

## 2011-06-12 LAB — POCT HEMOGLOBIN-HEMACUE: Hemoglobin: 13.5 g/dL (ref 12.0–15.0)

## 2011-06-13 NOTE — Op Note (Signed)
NAMEMARCAYLA, [name]                ACCOUNT NO.:  [id number]  MEDICAL RECORD NO.:  [id number]  LOCATION:                                 FACILITY:  PHYSICIAN:  Katy Fitch. Vika Buske, M.D.      DATE OF BIRTH:  DATE OF PROCEDURE:  06/12/2011 DATE OF DISCHARGE:                              OPERATIVE REPORT   PREOPERATIVE DIAGNOSES: 1. Right ring finger stenosing tenosynovitis. 2. Chronic left lateral elbow pain consistent with chronic     epicondylitis with a palpable osteophyte at the left humeral     epicondyle.  POSTOPERATIVE DIAGNOSES: 1. Right ring finger stenosing tenosynovitis. 2. Chronic left lateral elbow pain consistent with chronic     epicondylitis with a palpable osteophyte at the left humeral     epicondyle.  OPERATION: 1. Injection of right ring finger flexor sheath with 20 mg of Depo-     Medrol and 1 mL of 2% plain lidocaine. 2. Reconstruction of extensor carpi radialis longus and extensor carpi     radialis brevis origin, left lateral epicondyle with drilling of     lateral epicondyle and debridement of necrotic tendon.  OPERATIONS:  Katy Fitch. Julionna Marczak, MD  ASSISTANT:  Marveen Reeks Dasnoit, PA-C  ANESTHESIA:  General by LMA.  SUPERVISING ANESTHESIOLOGIST:  Germaine Pomfret, MD  INDICATIONS:  [name] is a 55 year old woman well acquainted with our practice.  More than 6 years ago, we reconstructed the right common extensor origin due to chronic right lateral epicondylitis, because she has had a durable long-term result with excellent pain relief.  She presented for evaluation and management of chronic left elbow pain. She stated that her left elbow had lateral pain identical to the right elbow.  She had a palpable osteophyte at the epicondyle and all the positive findings for epicondylitis.  She tried stretching and all the nonoperative treatment measures for more than 6 months without relief. She returned this time requesting reconstruction of the left  common extensor origin.  Clinical examination confirmed marked tenderness at the lateral epicondyle.  She had a bony ridge measuring more than 3 mm in height at the epicondyle evidencing prior tendon rupture and had significant grip impairment.  We recommended proceeding with an identical procedure that she had on the right side, which is debridement of necrotic tendon and true bone suture reconstruction of the common extensor origin to the lateral epicondyle after drilling.  Questions were invited and answered in detail.  Ms. [name] also had a locking right ring trigger finger that she would request that we inject while under anesthesia.  Questions regarding both procedures were invited and answered in detail.  PROCEDURE:  [name] is brought to room 2 of the [facility] and placed in supine position on the operating table.  Following the induction of general anesthesia by LMA technique under Dr. Edison Pace direct supervision, the left arm was prepped with Betadine soap and solution, sterilely draped.  A pneumatic tourniquet was applied to the proximal brachium.  1 gram of Ancef was administered as an IV prophylactic antibiotic.  The right palm was prepped with Betadine and after routine surgical time- out, a mixture of  20 mg Depo-Medrol and 1 mL of 2% plain lidocaine was then directed into the flexor sheath with the finger flexed and then extended during injection to prevent intertendinous injection. Distention of the sheath was achieved.  This was dressed with a gauze dressing.  The left elbow was then addressed with a curvilinear incision posterior to the lateral epicondyle.  Subcutaneous tissues were carefully divided releasing the fascia exposing the anconeus and extensor carpi radialis longus origin.  The extensor carpi radialis longus and brevis were elevated sharply off of the epicondyle with an intact tendon superficially and a cavitary tendinopathy  with release of the tendons from the bone noted distally in deep.  There was about a 3 mL cavity.  The epicondyle osteophyte was removed with a rongeur followed by drilling the lateral epicondyle of the left humerus approximately 30 times with a 0.035-inch Kirschner wire.  A pair of mattress sutures of 3-0 FiberWire was used to gather the extensor carpi radialis longus and brevis through drill holes in anatomic footprint followed by using the suture tails to inset the margin anatomically.  The tendon was repaired with the elbow in full extension and the wrist fully extended.  The fascia was then repaired with a running 3-0 Vicryl suture over the repair of the common extensor origin.  The wound was thoroughly irrigated followed by repair of the skin with subcutaneous 0 Vicryl, intradermal 3-0 Prolene with Steri- Strips.  Ms. Strough was placed in compressive dressing with a volar wrist splint, maintaining the wrist in 40 degrees of dorsiflexion and her elbow was dressed with Tegaderm and gauze.  An Ace wrap was applied for comfort.  The elbow wound was infiltrated with 0.25% Marcaine with epinephrine for postoperative analgesia.  She was provided Toradol 30 mg IV for an additional perioperative analgesic.     Katy Fitch Laylanie Kruczek, M.D.     RVS/MEDQ  D:  06/12/2011  T:  06/12/2011  Job:  [id number]  Electronically Signed by Josephine Igo M.D. on 06/13/2011 11:43:50 AM

## 2011-07-02 LAB — POCT HEMOGLOBIN-HEMACUE: Operator id: 123881

## 2011-07-03 LAB — BASIC METABOLIC PANEL
Calcium: 9.4
Creatinine, Ser: 0.71
GFR calc Af Amer: >60
GFR calc non Af Amer: >60
Glucose, Bld: 102 — ABNORMAL HIGH
Sodium: 137

## 2011-09-04 ENCOUNTER — Ambulatory Visit
Admission: RE | Admit: 2011-09-04 | Discharge: 2011-09-04 | Disposition: A | Discharge status: Home / Self Care | Payer: BC Managed Care – PPO | Source: Ambulatory Visit | Attending: Cardiology | Admitting: Cardiology

## 2011-09-04 ENCOUNTER — Other Ambulatory Visit: Payer: Self-pay | Admitting: Cardiology

## 2011-09-04 DIAGNOSIS — N200 Calculus of kidney: Secondary | ICD-10-CM

## 2011-11-11 ENCOUNTER — Other Ambulatory Visit: Payer: Self-pay | Admitting: Allergy and Immunology

## 2011-11-11 ENCOUNTER — Ambulatory Visit
Admission: RE | Admit: 2011-11-11 | Discharge: 2011-11-11 | Disposition: A | Discharge status: Home / Self Care | Payer: BC Managed Care – PPO | Source: Ambulatory Visit | Attending: Allergy and Immunology | Admitting: Allergy and Immunology

## 2011-11-11 DIAGNOSIS — R059 Cough, unspecified: Secondary | ICD-10-CM

## 2011-11-11 DIAGNOSIS — R05 Cough: Secondary | ICD-10-CM

## 2012-05-17 ENCOUNTER — Ambulatory Visit (HOSPITAL_COMMUNITY)
Admission: RE | Admit: 2012-05-17 | Discharge: 2012-05-17 | Disposition: A | Discharge status: Home / Self Care | Payer: BC Managed Care – PPO | Source: Ambulatory Visit | Attending: Emergency Medicine | Admitting: Emergency Medicine

## 2012-05-17 ENCOUNTER — Encounter (HOSPITAL_COMMUNITY): Payer: Self-pay

## 2012-05-17 ENCOUNTER — Emergency Department (HOSPITAL_COMMUNITY)
Admission: EM | Admit: 2012-05-17 | Discharge: 2012-05-17 | Disposition: A | Discharge status: Home / Self Care | Payer: BC Managed Care – PPO | Source: Home / Self Care | Attending: Emergency Medicine | Admitting: Emergency Medicine

## 2012-05-17 DIAGNOSIS — M79609 Pain in unspecified limb: Secondary | ICD-10-CM | POA: Insufficient documentation

## 2012-05-17 DIAGNOSIS — M7989 Other specified soft tissue disorders: Secondary | ICD-10-CM | POA: Insufficient documentation

## 2012-05-17 DIAGNOSIS — M79606 Pain in leg, unspecified: Secondary | ICD-10-CM

## 2012-05-17 HISTORY — DX: Essential (primary) hypertension: I10

## 2012-05-17 HISTORY — DX: Unspecified osteoarthritis, unspecified site: M19.90

## 2012-05-17 MED ORDER — MELOXICAM 7.5 MG PO TABS: 7.5000 mg | ORAL_TABLET | Freq: Every day | ORAL | Status: DC

## 2012-05-17 NOTE — ED Notes (Signed)
Leg pain since July, seen by Dr Shana Chute; NAD at present

## 2012-05-17 NOTE — ED Provider Notes (Addendum)
History     CSN: [id number]  Arrival date & time 05/17/12  1200   First MD Initiated Contact with Patient 05/17/12 1244      Chief Complaint  Patient presents with  . Leg Pain    (Consider location/radiation/quality/duration/timing/severity/associated sxs/prior treatment) HPI Comments: Patient presents urgent care this afternoon complaining of a recurrent and ongoing right lower extremity pain she describes starts in her mid calf area and shoots up all way to the back of her knee to the mid thigh area. She's been having this discomfort since mid July after they return from a trip to [place]. She denies any obvious swelling of her leg in comparison with the opposite and denies any trauma or injury to it. Patient also denies any respiratory symptoms such as cough shortness of breath or chest pains. No redness or skin changes that she had been able to noticed. She got a bit concerned in the last few days says she's had this pain shooting up to her leg (she points to posterior aspect of her thigh)  Patient is a 56 y.o. female presenting with leg pain. The history is provided by the patient and the spouse.  Leg Pain  The incident occurred more than 1 week ago. The pain is at a severity of 6/10. The pain is moderate. The pain has been constant since onset. Associated symptoms include loss of motion. Pertinent negatives include no numbness, no inability to bear weight, no muscle weakness, no loss of sensation and no tingling.    Past Medical History  Diagnosis Date  . Arthritis   . Hypertension     History reviewed. No pertinent past surgical history.  History reviewed. No pertinent family history.  History  Substance Use Topics  . Smoking status: Never Smoker   . Smokeless tobacco: Not on file  . Alcohol Use: No    OB History    Grav Para Term Preterm Abortions TAB SAB Ect Mult Living                  Review of Systems  Constitutional: Negative for fever, chills, diaphoresis,  activity change and appetite change.  Respiratory: Negative for cough and shortness of breath.   Cardiovascular: Negative for chest pain, palpitations and leg swelling.  Musculoskeletal: Negative for back pain, joint swelling, arthralgias and gait problem.  Skin: Negative for color change, pallor and wound.  Neurological: Negative for tingling, weakness and numbness.    Allergies  Flexeril; Penicillins; Percocet; and Zanaflex  Home Medications   Current Outpatient Rx  Name Route Sig Dispense Refill  . CALCIUM CARBONATE 600 MG PO TABS Oral Take 600 mg by mouth 2 (two) times daily with a meal.    . GABAPENTIN 300 MG PO CAPS Oral Take 300 mg by mouth 3 (three) times daily.    Marland Kitchen HYDROCODONE-ACETAMINOPHEN 2.5-500 MG PO TABS Oral Take 1 tablet by mouth every 6 (six) hours as needed.    . METHOCARBAMOL 750 MG PO TABS Oral Take 750 mg by mouth 4 (four) times daily.    Marland Kitchen OMEPRAZOLE 20 MG PO CPDR Oral Take 20 mg by mouth daily.    Marland Kitchen VALSARTAN-HYDROCHLOROTHIAZIDE 160-12.5 MG PO TABS Oral Take 1 tablet by mouth daily.    Marland Kitchen VITAMIN E 400 UNITS PO CAPS Oral Take 400 Units by mouth daily.    . MELOXICAM 7.5 MG PO TABS Oral Take 1 tablet (7.5 mg total) by mouth daily. 14 tablet 0    BP 148/75  Pulse  67  Temp 98.1 F (36.7 C) (Oral)  Resp 18  Wt 187 lb (84.823 kg)  SpO2 99%  Physical Exam  Nursing note and vitals reviewed. Constitutional: Vital signs are normal. She appears well-developed and well-nourished.  Non-toxic appearance. She does not have a sickly appearance. She does not appear ill. No distress.  Neck: Neck supple.  Cardiovascular: Normal rate.   Pulmonary/Chest: Effort normal.  Skin: No rash (social a with a who was in a is a with a with a 1 cystoscopy and an in a) noted. No erythema.       ED Course  Procedures (including critical care time)  Negative venous Doppler of right lower extremity  1. Leg pain       MDM  Patient with right lower extremity recurrent pain  since mid July. Patient with + risk factors file thromboembolic events. contraceptive hormonal device, and a recent long trip with partial immobility . Patient did have posterior calf pain and trace edema, a Venous Doppler . is being performed to rule out a deep vein thrombosis to her right lower extremity.      The outer  Jimmie Molly, MD 05/17/12 1447  Jimmie Molly, MD 05/17/12 1520

## 2012-05-17 NOTE — Progress Notes (Signed)
VASCULAR LAB PRELIMINARY  PRELIMINARY  PRELIMINARY  PRELIMINARY  Right lower extremity venous Doppler completed.    Preliminary report:  No DVT or SVT noted in the right lower extremity.  Matthew Cina, 05/17/2012, 3:07 PM

## 2012-10-13 ENCOUNTER — Encounter (HOSPITAL_COMMUNITY): Payer: Self-pay

## 2012-10-13 ENCOUNTER — Emergency Department (HOSPITAL_COMMUNITY)
Admission: EM | Admit: 2012-10-13 | Discharge: 2012-10-13 | Disposition: A | Discharge status: Home / Self Care | Payer: BC Managed Care – PPO | Attending: Emergency Medicine | Admitting: Emergency Medicine

## 2012-10-13 ENCOUNTER — Emergency Department (HOSPITAL_COMMUNITY): Payer: BC Managed Care – PPO

## 2012-10-13 DIAGNOSIS — Z87891 Personal history of nicotine dependence: Secondary | ICD-10-CM | POA: Insufficient documentation

## 2012-10-13 DIAGNOSIS — W010XXA Fall on same level from slipping, tripping and stumbling without subsequent striking against object, initial encounter: Secondary | ICD-10-CM | POA: Insufficient documentation

## 2012-10-13 DIAGNOSIS — J45909 Unspecified asthma, uncomplicated: Secondary | ICD-10-CM | POA: Insufficient documentation

## 2012-10-13 DIAGNOSIS — Y939 Activity, unspecified: Secondary | ICD-10-CM | POA: Insufficient documentation

## 2012-10-13 DIAGNOSIS — I10 Essential (primary) hypertension: Secondary | ICD-10-CM | POA: Insufficient documentation

## 2012-10-13 DIAGNOSIS — S79929A Unspecified injury of unspecified thigh, initial encounter: Secondary | ICD-10-CM | POA: Insufficient documentation

## 2012-10-13 DIAGNOSIS — S79919A Unspecified injury of unspecified hip, initial encounter: Secondary | ICD-10-CM | POA: Insufficient documentation

## 2012-10-13 DIAGNOSIS — M129 Arthropathy, unspecified: Secondary | ICD-10-CM | POA: Insufficient documentation

## 2012-10-13 DIAGNOSIS — Z79899 Other long term (current) drug therapy: Secondary | ICD-10-CM | POA: Insufficient documentation

## 2012-10-13 DIAGNOSIS — IMO0002 Reserved for concepts with insufficient information to code with codable children: Secondary | ICD-10-CM | POA: Insufficient documentation

## 2012-10-13 DIAGNOSIS — E7800 Pure hypercholesterolemia, unspecified: Secondary | ICD-10-CM | POA: Insufficient documentation

## 2012-10-13 DIAGNOSIS — S39012A Strain of muscle, fascia and tendon of lower back, initial encounter: Secondary | ICD-10-CM

## 2012-10-13 DIAGNOSIS — Z8739 Personal history of other diseases of the musculoskeletal system and connective tissue: Secondary | ICD-10-CM | POA: Insufficient documentation

## 2012-10-13 DIAGNOSIS — Y929 Unspecified place or not applicable: Secondary | ICD-10-CM | POA: Insufficient documentation

## 2012-10-13 HISTORY — DX: Dorsalgia, unspecified: M54.9

## 2012-10-13 HISTORY — DX: Unspecified asthma, uncomplicated: J45.909

## 2012-10-13 HISTORY — DX: Other chronic pain: G89.29

## 2012-10-13 MED ORDER — HYDROCODONE-ACETAMINOPHEN 5-325 MG PO TABS: 1.0000 | ORAL_TABLET | ORAL | Status: DC | PRN

## 2012-10-13 MED ORDER — METAXALONE 800 MG PO TABS: 800.0000 mg | ORAL_TABLET | Freq: Three times a day (TID) | ORAL | Status: DC

## 2012-10-13 MED ORDER — IBUPROFEN 600 MG PO TABS: 600.0000 mg | ORAL_TABLET | Freq: Four times a day (QID) | ORAL | Status: DC | PRN

## 2012-10-13 MED ORDER — HYDROCODONE-ACETAMINOPHEN 5-325 MG PO TABS
1.0000 | ORAL_TABLET | Freq: Once | ORAL | Status: AC
  Administered 2012-10-13: 1 via ORAL
  Filled 2012-10-13: qty 1

## 2012-10-13 MED ORDER — HYDROMORPHONE HCL PF 1 MG/ML IJ SOLN
1.0000 mg | Freq: Once | INTRAMUSCULAR | Status: AC
  Administered 2012-10-13: 1 mg via INTRAMUSCULAR
  Filled 2012-10-13: qty 1

## 2012-10-13 MED ORDER — IBUPROFEN 200 MG PO TABS: 600.0000 mg | ORAL_TABLET | Freq: Once | ORAL | Status: DC

## 2012-10-13 NOTE — ED Notes (Signed)
Per EMS, Pt slipped on ice and fell down steps.  Pt c/o low back and R hip pain.  Pain score 10/10.  Denies hitting head and LOC.  Vitals are stable.  No deformity noted.  Denies numbness and tingling.  Hx of chronic back pain, bulging discs and spinal stenosis.

## 2012-10-13 NOTE — ED Notes (Signed)
Bed:WA22<BR> Expected date:<BR> Expected time:<BR> Means of arrival:<BR> Comments:<BR> EMS

## 2012-10-13 NOTE — ED Notes (Signed)
Pt escorted to discharge window. Verbalized understanding discharge instructions. In no acute distress.   

## 2012-10-13 NOTE — ED Notes (Signed)
Pt taken off LSB, c-collar remains intact. Pt c/o low back pain with palpation of low back. Denied complaints of pain with palpation of upper back.

## 2012-10-13 NOTE — ED Provider Notes (Signed)
History     CSN: [id number]  Arrival date & time 10/13/12  1359   First MD Initiated Contact with Patient 10/13/12 1515      Chief Complaint  Patient presents with  . Fall  . Back Pain  . Hip Pain    (Consider location/radiation/quality/duration/timing/severity/associated sxs/prior treatment) HPI Pt with history of chronic back pain presents after slipping on ice and falling backward down several steps. Was able to ambulate with assistance when EMS arrived. Placed in C-collar. No head or neck injury. No LOC. Pt c/o R pelvis and low back pain. No focal weakness or numbness. No radiation down leg.  Past Medical History  Diagnosis Date  . Arthritis   . Hypertension   . Asthma   . High cholesterol   . Spinal stenosis   . Chronic back pain     Past Surgical History  Procedure Date  . Knee surgery   . Kidney surgery   . Elbow surgery   . Rotator cuff repair     History reviewed. No pertinent family history.  History  Substance Use Topics  . Smoking status: Former Games developer  . Smokeless tobacco: Not on file  . Alcohol Use: No    OB History    Grav Para Term Preterm Abortions TAB SAB Ect Mult Living                  Review of Systems  Constitutional: Negative for fever and chills.  HENT: Negative for neck pain and neck stiffness.   Eyes: Negative for visual disturbance.  Respiratory: Negative for cough, chest tightness and shortness of breath.   Cardiovascular: Negative for chest pain.  Gastrointestinal: Negative for nausea, vomiting and abdominal pain.  Musculoskeletal: Positive for back pain. Negative for myalgias, joint swelling and arthralgias.  Skin: Negative for rash and wound.  Neurological: Negative for dizziness, syncope, weakness, light-headedness, numbness and headaches.  All other systems reviewed and are negative.    Allergies  Flexeril; Penicillins; Percocet; and Zanaflex  Home Medications   Current Outpatient Rx  Name  Route  Sig  Dispense   Refill  . BECLOMETHASONE DIPROPIONATE 80 MCG/ACT IN AERS   Inhalation   Inhale 1 puff into the lungs as needed. Wheezing         . CALCIUM CARBONATE 600 MG PO TABS   Oral   Take 600 mg by mouth 2 (two) times daily with a meal.         . GABAPENTIN 300 MG PO CAPS   Oral   Take 300 mg by mouth at bedtime.          Marland Kitchen GINGER PO   Oral   Take 1 tablet by mouth daily.         Marland Kitchen HYDROCODONE-ACETAMINOPHEN 5-325 MG PO TABS   Oral   Take 1 tablet by mouth every 6 (six) hours as needed. Pain         . TART CHERRY ADVANCED PO   Oral   Take 1 tablet by mouth daily.         Marland Kitchen OMEPRAZOLE 20 MG PO CPDR   Oral   Take 20 mg by mouth daily.         Marland Kitchen ROSUVASTATIN CALCIUM 5 MG PO TABS   Oral   Take 5 mg by mouth daily.         Marland Kitchen VALSARTAN-HYDROCHLOROTHIAZIDE 160-12.5 MG PO TABS   Oral   Take 1 tablet by mouth daily.         Marland Kitchen  VITAMIN E 400 UNITS PO CAPS   Oral   Take 400 Units by mouth daily.         Marland Kitchen HYDROCODONE-ACETAMINOPHEN 5-325 MG PO TABS   Oral   Take 1 tablet by mouth every 4 (four) hours as needed for pain.   15 tablet   0     BP 144/85  Pulse 74  Temp 98.3 F (36.8 C) (Oral)  Resp 20  SpO2 100%  Physical Exam  Nursing note and vitals reviewed. Constitutional: She is oriented to person, place, and time. She appears well-developed and well-nourished. No distress.  HENT:  Head: Normocephalic and atraumatic.  Mouth/Throat: Oropharynx is clear and moist.  Eyes: EOM are normal. Pupils are equal, round, and reactive to light.  Neck: Normal range of motion. Neck supple.       No posterior midline cervical TTP. FROM. C-collar removed in ED.   Cardiovascular: Normal rate and regular rhythm.   Pulmonary/Chest: Effort normal and breath sounds normal. No respiratory distress. She has no wheezes. She has no rales. She exhibits no tenderness.  Abdominal: Soft. Bowel sounds are normal. She exhibits no distension and no mass. There is no tenderness. There  is no rebound and no guarding.  Musculoskeletal: Normal range of motion. She exhibits tenderness. She exhibits no edema.       Mild low lumbar and R paraspinal tenderness. No deformity or step offs. Full ROM in all joints. No deformity. 2+ distal pulses.   Neurological: She is alert and oriented to person, place, and time.       5/5 motor in all joints. Sensation intact  Skin: Skin is warm and dry. No rash noted. No erythema.  Psychiatric: She has a normal mood and affect. Her behavior is normal.    ED Course  Procedures (including critical care time)  Labs Reviewed - No data to display Dg Lumbar Spine Complete  10/13/2012  *RADIOLOGY REPORT*  Clinical Data: Low back and right hip pain secondary to a fall.  LUMBAR SPINE - COMPLETE 4+ VIEW  Comparison: CT scan abdomen and pelvis dated 03/16/2012  Findings: There is no fracture or other acute abnormality.  There is moderate facet arthritis at L4-5 particularly on the right with a 2 mm spondylolisthesis with minimal narrowing of the disc space. The findings are unchanged since the prior CT scan of 03/16/2012. No other abnormalities.  IMPRESSION: No acute abnormality.  Chronic degenerative disc and joint disease at L4-5.   Original Report Authenticated By: Francene Boyers, M.D.    Dg Pelvis 1-2 Views  10/13/2012  *RADIOLOGY REPORT*  Clinical Data: Low back pain and right hip pain secondary to a fall.  PELVIS - 1-2 VIEW  Comparison: Radiographs dated 09/04/2011  Findings: There is no fracture, joint space narrowing, effusion, or other significant abnormality.  IMPRESSION: Normal exam.   Original Report Authenticated By: Francene Boyers, M.D.      1. Low back strain       MDM  Will shoot plain xray of effected area and treat with pain meds. Low suspicion for fracture or significant injury        Loren Racer, MD 10/13/12 1742

## 2013-01-07 ENCOUNTER — Other Ambulatory Visit: Payer: Self-pay | Admitting: Orthopaedic Surgery

## 2013-01-07 DIAGNOSIS — M5136 Other intervertebral disc degeneration, lumbar region: Secondary | ICD-10-CM

## 2013-01-12 ENCOUNTER — Other Ambulatory Visit: Payer: BC Managed Care – PPO

## 2013-01-12 ENCOUNTER — Ambulatory Visit
Admission: RE | Admit: 2013-01-12 | Discharge: 2013-01-12 | Disposition: A | Discharge status: Home / Self Care | Payer: BC Managed Care – PPO | Source: Ambulatory Visit | Attending: Orthopaedic Surgery | Admitting: Orthopaedic Surgery

## 2013-01-12 ENCOUNTER — Inpatient Hospital Stay
Admission: RE | Admit: 2013-01-12 | Discharge: 2013-01-12 | Disposition: A | Discharge status: Home / Self Care | Payer: Self-pay | Source: Ambulatory Visit | Attending: Orthopaedic Surgery | Admitting: Orthopaedic Surgery

## 2013-01-12 ENCOUNTER — Other Ambulatory Visit: Payer: Self-pay | Admitting: Orthopaedic Surgery

## 2013-01-12 VITALS — BP 142/76 | HR 59

## 2013-01-12 DIAGNOSIS — R52 Pain, unspecified: Secondary | ICD-10-CM

## 2013-01-12 DIAGNOSIS — M5136 Other intervertebral disc degeneration, lumbar region: Secondary | ICD-10-CM

## 2013-01-12 MED ORDER — IOHEXOL 180 MG/ML  SOLN
15.0000 mL | Freq: Once | INTRAMUSCULAR | Status: AC | PRN
  Administered 2013-01-12: 15 mL via INTRATHECAL

## 2013-01-12 MED ORDER — DIAZEPAM 5 MG PO TABS
10.0000 mg | ORAL_TABLET | Freq: Once | ORAL | Status: AC
  Administered 2013-01-12: 10 mg via ORAL

## 2013-01-14 ENCOUNTER — Other Ambulatory Visit: Payer: BC Managed Care – PPO

## 2013-06-28 ENCOUNTER — Emergency Department (HOSPITAL_COMMUNITY): Payer: BC Managed Care – PPO

## 2013-06-28 ENCOUNTER — Emergency Department (HOSPITAL_COMMUNITY)
Admission: EM | Admit: 2013-06-28 | Discharge: 2013-06-28 | Discharge status: Against Medical Advice | Payer: BC Managed Care – PPO | Attending: Emergency Medicine | Admitting: Emergency Medicine

## 2013-06-28 ENCOUNTER — Encounter (HOSPITAL_COMMUNITY): Payer: Self-pay | Admitting: Emergency Medicine

## 2013-06-28 DIAGNOSIS — E669 Obesity, unspecified: Secondary | ICD-10-CM | POA: Insufficient documentation

## 2013-06-28 DIAGNOSIS — R6884 Jaw pain: Secondary | ICD-10-CM | POA: Insufficient documentation

## 2013-06-28 DIAGNOSIS — Z79899 Other long term (current) drug therapy: Secondary | ICD-10-CM | POA: Insufficient documentation

## 2013-06-28 DIAGNOSIS — Z8739 Personal history of other diseases of the musculoskeletal system and connective tissue: Secondary | ICD-10-CM | POA: Insufficient documentation

## 2013-06-28 DIAGNOSIS — J45909 Unspecified asthma, uncomplicated: Secondary | ICD-10-CM | POA: Insufficient documentation

## 2013-06-28 DIAGNOSIS — R079 Chest pain, unspecified: Secondary | ICD-10-CM

## 2013-06-28 DIAGNOSIS — I10 Essential (primary) hypertension: Secondary | ICD-10-CM | POA: Insufficient documentation

## 2013-06-28 DIAGNOSIS — R110 Nausea: Secondary | ICD-10-CM | POA: Insufficient documentation

## 2013-06-28 DIAGNOSIS — Z87891 Personal history of nicotine dependence: Secondary | ICD-10-CM | POA: Insufficient documentation

## 2013-06-28 DIAGNOSIS — R071 Chest pain on breathing: Secondary | ICD-10-CM | POA: Insufficient documentation

## 2013-06-28 DIAGNOSIS — E7800 Pure hypercholesterolemia, unspecified: Secondary | ICD-10-CM | POA: Insufficient documentation

## 2013-06-28 DIAGNOSIS — R209 Unspecified disturbances of skin sensation: Secondary | ICD-10-CM | POA: Insufficient documentation

## 2013-06-28 LAB — COMPREHENSIVE METABOLIC PANEL
AST: 14 U/L (ref 0–37)
BUN: 10 mg/dL (ref 6–23)
CO2: 29 mEq/L (ref 19–32)
Calcium: 9.5 mg/dL (ref 8.4–10.5)
Chloride: 101 mEq/L (ref 96–112)
Creatinine, Ser: 0.89 mg/dL (ref 0.50–1.10)
GFR calc Af Amer: 81 mL/min — ABNORMAL LOW (ref >90)
GFR calc non Af Amer: 70 mL/min — ABNORMAL LOW (ref >90)
Glucose, Bld: 110 mg/dL — ABNORMAL HIGH (ref 70–99)
Total Bilirubin: 0.3 mg/dL (ref 0.3–1.2)

## 2013-06-28 LAB — POCT I-STAT TROPONIN I: Troponin i, poc: 0 ng/mL (ref 0.00–0.08)

## 2013-06-28 LAB — CBC WITH DIFFERENTIAL/PLATELET
Eosinophils Absolute: 0.1 10*3/uL (ref 0.0–0.7)
Eosinophils Relative: 1 % (ref 0–5)
HCT: 38.1 % (ref 36.0–46.0)
Hemoglobin: 13 g/dL (ref 12.0–15.0)
Lymphocytes Relative: 20 % (ref 12–46)
Lymphs Abs: 1.5 10*3/uL (ref 0.7–4.0)
MCH: 30.5 pg (ref 26.0–34.0)
Monocytes Absolute: 0.6 10*3/uL (ref 0.1–1.0)
Monocytes Relative: 8 % (ref 3–12)
Neutro Abs: 5.3 10*3/uL (ref 1.7–7.7)
WBC: 7.5 10*3/uL (ref 4.0–10.5)

## 2013-06-28 LAB — LIPASE, BLOOD: Lipase: 24 U/L (ref 11–59)

## 2013-06-28 MED ORDER — ASPIRIN 81 MG PO CHEW
324.0000 mg | CHEWABLE_TABLET | Freq: Once | ORAL | Status: AC
  Administered 2013-06-28: 324 mg via ORAL
  Filled 2013-06-28: qty 4

## 2013-06-28 MED ORDER — NITROGLYCERIN IN D5W 200-5 MCG/ML-% IV SOLN: 10.0000 ug/min | Freq: Once | INTRAVENOUS | Status: DC

## 2013-06-28 MED ORDER — ONDANSETRON HCL 4 MG/2ML IJ SOLN
INTRAMUSCULAR | Status: AC
  Filled 2013-06-28: qty 2

## 2013-06-28 MED ORDER — ONDANSETRON HCL 4 MG/2ML IJ SOLN: 4.0000 mg | Freq: Once | INTRAMUSCULAR | Status: DC

## 2013-06-28 MED ORDER — LORAZEPAM 2 MG/ML IJ SOLN: 1.0000 mg | Freq: Once | INTRAMUSCULAR | Status: DC

## 2013-06-28 MED ORDER — HEPARIN SODIUM (PORCINE) 5000 UNIT/ML IJ SOLN: 4000.0000 [IU] | Freq: Once | INTRAMUSCULAR | Status: DC

## 2013-06-28 MED ORDER — ONDANSETRON 4 MG PO TBDP
4.0000 mg | ORAL_TABLET | Freq: Once | ORAL | Status: AC
  Administered 2013-06-28: 4 mg via ORAL
  Filled 2013-06-28: qty 1

## 2013-06-28 MED ORDER — HEPARIN (PORCINE) IN NACL 100-0.45 UNIT/ML-% IJ SOLN
1100.0000 [IU]/h | INTRAMUSCULAR | Status: DC
  Filled 2013-06-28: qty 250

## 2013-06-28 NOTE — Consult Note (Signed)
Reason for Consult: Left shoulder arm and jaw pain Referring Physician: Dr. Barbie Banner [place] is an 57 y.o. female.  HPI: Patient is 57 year old female with past medical history significant for hypertension history of bronchial asthma, hypercholesteremia, GERD, fibromyalgia, strong family history of coronary artery disease mother died of massive MI at age of 59, remote tobacco abuse came to the ER complaining of nausea associated with left arm shoulder and jaw pain off and on since last night EKG done in the ER showed no acute ischemic changes patient received 4 baby aspirin with partial relief of arm and jaw pain. Patient denies any chest pain diaphoresis or shortness of breath. Denies any history of exertional angina. Denies any palpitation lightheadedness or syncope. Denies any recent cardiac workup patient states she had stress test approximately 6-7 years ago which was negative. Patient also complains of significant back pain and wishes to go home as she lays down most of the time and recliner due to recent back surgery and refusing to be admitted and wanted to be further worked up as outpatient. Patient denies any neck trauma or surgery in the past but states gets swelling in the left shoulder due to fibromyalgia.  Past Medical History  Diagnosis Date  . Arthritis   . Hypertension   . Asthma   . High cholesterol   . Spinal stenosis   . Chronic back pain     Past Surgical History  Procedure Laterality Date  . Knee surgery    . Kidney surgery    . Elbow surgery    . Rotator cuff repair      History reviewed. No pertinent family history.  Social History:  reports that she has quit smoking. She does not have any smokeless tobacco history on file. She reports that she does not drink alcohol or use illicit drugs.  Allergies:  Allergies  Allergen Reactions  . Flexeril [Cyclobenzaprine] Other (See Comments)    "out of it "  . Penicillins Diarrhea  . Percocet  [Oxycodone-Acetaminophen] Nausea And Vomiting  . Zanaflex [Tizanidine Hcl] Other (See Comments)    hallucinations     Medications: I have reviewed the patient's current medications.  Results for orders placed during the hospital encounter of 06/28/13 (from the past 48 hour(s))  CBC WITH DIFFERENTIAL     Status: None   Collection Time    06/28/13  3:57 PM      Result Value Range   WBC 7.5  4.0 - 10.5 K/uL   RBC 4.26  3.87 - 5.11 MIL/uL   Hemoglobin 13.0  12.0 - 15.0 g/dL   HCT 16.1  09.6 - 04.5 %   MCV 89.4  78.0 - 100.0 fL   MCH 30.5  26.0 - 34.0 pg   MCHC 34.1  30.0 - 36.0 g/dL   RDW 40.9  81.1 - 91.4 %   Platelets 150  150 - 400 K/uL   Neutrophils Relative % 71  43 - 77 %   Neutro Abs 5.3  1.7 - 7.7 K/uL   Lymphocytes Relative 20  12 - 46 %   Lymphs Abs 1.5  0.7 - 4.0 K/uL   Monocytes Relative 8  3 - 12 %   Monocytes Absolute 0.6  0.1 - 1.0 K/uL   Eosinophils Relative 1  0 - 5 %   Eosinophils Absolute 0.1  0.0 - 0.7 K/uL   Basophils Relative 0  0 - 1 %   Basophils Absolute 0.0  0.0 -  0.1 K/uL  COMPREHENSIVE METABOLIC PANEL     Status: Abnormal   Collection Time    06/28/13  3:57 PM      Result Value Range   Sodium 139  135 - 145 mEq/L   Potassium 3.5  3.5 - 5.1 mEq/L   Chloride 101  96 - 112 mEq/L   CO2 29  19 - 32 mEq/L   Glucose, Bld 110 (*) 70 - 99 mg/dL   BUN 10  6 - 23 mg/dL   Creatinine, Ser 4.09  0.50 - 1.10 mg/dL   Calcium 9.5  8.4 - 81.1 mg/dL   Total Protein 7.6  6.0 - 8.3 g/dL   Albumin 3.8  3.5 - 5.2 g/dL   AST 14  0 - 37 U/L   ALT 11  0 - 35 U/L   Alkaline Phosphatase 68  39 - 117 U/L   Total Bilirubin 0.3  0.3 - 1.2 mg/dL   GFR calc non Af Amer 70 (*) >90 mL/min   GFR calc Af Amer 81 (*) >90 mL/min   Comment: (NOTE)     The eGFR has been calculated using the CKD EPI equation.     This calculation has not been validated in all clinical situations.     eGFR's persistently <90 mL/min signify possible Chronic Kidney     Disease.  LIPASE, BLOOD      Status: None   Collection Time    06/28/13  3:57 PM      Result Value Range   Lipase 24  11 - 59 U/L  POCT I-STAT TROPONIN I     Status: None   Collection Time    06/28/13  4:19 PM      Result Value Range   Troponin i, poc 0.00  0.00 - 0.08 ng/mL   Comment 3            Comment: Due to the release kinetics of cTnI,     a negative result within the first hours     of the onset of symptoms does not rule out     myocardial infarction with certainty.     If myocardial infarction is still suspected,     repeat the test at appropriate intervals.    Dg Chest 2 View  06/28/2013   *RADIOLOGY REPORT*  Clinical Data: Chest pressure, radiating into left arm and jaw  CHEST - 2 VIEW  Comparison: 11/11/2011; 10/04/2008  Findings: Grossly unchanged cardiac silhouette and mediastinal contours.  No focal parenchymal opacities.  No pleural effusion or pneumothorax.  No evidence of edema.  There is mild elevation of the right hemidiaphragm. Post lower lumbar paraspinal fusion, incompletely evaluated.  IMPRESSION: No acute cardiopulmonary disease.   Original Report Authenticated By: Tacey Ruiz, MD    Review of Systems  Constitutional: Negative for fever, chills and weight loss.  Eyes: Negative for blurred vision and double vision.  Cardiovascular: Negative for chest pain, palpitations, orthopnea, claudication and leg swelling.  Gastrointestinal: Positive for nausea. Negative for vomiting, abdominal pain and diarrhea.  Genitourinary: Negative for dysuria.  Musculoskeletal: Positive for back pain.  Skin: Negative for rash.  Neurological: Negative for dizziness, tingling and headaches.   Blood pressure 146/78, pulse 66, temperature 98.3 F (36.8 C), temperature source Oral, resp. rate 15, weight 90.674 kg (199 lb 14.4 oz), SpO2 100.00%. Physical Exam  Constitutional: She is oriented to person, place, and time.  HENT:  Head: Normocephalic and atraumatic.  Eyes: Conjunctivae are normal. Pupils  are  equal, round, and reactive to light. Left eye exhibits no discharge. No scleral icterus.  Neck: Normal range of motion. Neck supple. No JVD present. No tracheal deviation present. No thyromegaly present.  Cardiovascular: Normal rate, regular rhythm and intact distal pulses.   No murmur heard. Respiratory: Effort normal and breath sounds normal. No respiratory distress. She has no wheezes. She has no rales.  GI: Soft. Bowel sounds are normal. She exhibits no distension. There is no tenderness. There is no rebound.  Musculoskeletal: She exhibits no edema.  Lymphadenopathy:    She has no cervical adenopathy.  Neurological: She is alert and oriented to person, place, and time. No cranial nerve deficit.    Assessment/Plan: Left shoulder arm and jaw pain worrisome for angina Hypertension Hypercholesteremia History of bronchial asthma GERD Remote tobacco abuse Strong family history of coronary artery disease Plan Discussed with patient regarding admission and rule out MI protocol and nuclear stress test in a.m. but wanted to be worked up as outpatient only. Patient advised to take sublingual nitroglycerin if she gets recurrent chest pain every 5 minutes and chew regular  aspirin and call 911. Will recheck second set of troponin I. if it is negative and willing to sign out AMA and will followup as outpatient  Lamaya Hyneman N 06/28/2013, 9:04 PM

## 2013-06-28 NOTE — Progress Notes (Addendum)
ANTICOAGULATION CONSULT NOTE - Initial Consult  Pharmacy Consult for heparin Indication: chest pain/ACS  Allergies  Allergen Reactions  . Flexeril [Cyclobenzaprine] Other (See Comments)    "out of it "  . Penicillins Diarrhea  . Percocet [Oxycodone-Acetaminophen] Nausea And Vomiting  . Zanaflex [Tizanidine Hcl] Other (See Comments)    hallucinations     Patient Measurements: Weight: 199 lb 14.4 oz (90.674 kg) Heparin Dosing Weight: 90kg  Vital Signs: Temp: 98.3 F (36.8 C) (10/07 1520) Temp src: Oral (10/07 1520) BP: 146/78 mmHg (10/07 1930) Pulse Rate: 66 (10/07 1930)  Labs:  Recent Labs  06/28/13 1557  HGB 13.0  HCT 38.1  PLT 150  CREATININE 0.89    CrCl is unknown because there is no height on file for the current visit.   Medical History: Past Medical History  Diagnosis Date  . Arthritis   . Hypertension   . Asthma   . High cholesterol   . Spinal stenosis   . Chronic back pain   Assessment: 57 year old female presents to [facility] with chest pain. Orders received to start IV heparin infusion. Baseline cbc within normal limits, patient not on anticoagulants pta.  Goal of Therapy:  Heparin level 0.3-0.7 units/ml Monitor platelets by anticoagulation protocol: Yes   Plan:  Give 4000 units bolus x 1 Start heparin infusion at 1100 units/hr Check anti-Xa level in 6 hours and daily while on heparin Continue to monitor H&H and platelets  Sheppard Coil PharmD., BCPS Clinical Pharmacist Pager [phone] 06/28/2013 8:38 PM  Heparin discontinued 06/28/2013 8:56 PM

## 2013-06-28 NOTE — ED Notes (Signed)
MD at bedside. 

## 2013-06-28 NOTE — ED Notes (Signed)
Patient asked that she get her discharge papers soon as she had not had any of her daily or pain medications. I explained to her that we had just gotten a critical call in and it would be a few minutes before the doctor could finish up her discharge papers. Both patient and husband just shook their heads and said OK.

## 2013-06-28 NOTE — ED Provider Notes (Signed)
CSN: [id number]     Arrival date & time 06/28/13  1514 History   First MD Initiated Contact with Patient 06/28/13 1855     Chief Complaint  Patient presents with  . Nausea  . Arm Pain  . Jaw Pain   (Consider location/radiation/quality/duration/timing/severity/associated sxs/prior Treatment) Patient is a 57 y.o. female presenting with chest pain. The history is provided by the patient.  Chest Pain Pain location:  L chest (and shoulder) Pain quality: pressure   Pain quality comment:  And heaviness Pain radiates to:  L jaw and L arm Pain severity:  Moderate Onset quality:  Sudden Duration:  3 hours Timing:  Constant Progression:  Partially resolved Chronicity:  New Context: at rest   Relieved by:  None tried Worsened by:  Nothing tried Ineffective treatments:  Aspirin Associated symptoms: nausea and numbness   Associated symptoms: no abdominal pain, no anorexia, no back pain, no cough, no dysphagia, no fever, no lower extremity edema, no palpitations, no shortness of breath, not vomiting and no weakness   Risk factors: high cholesterol, hypertension and obesity   Risk factors: no smoking   Risk factors comment:  Mom had MI at 24   Past Medical History  Diagnosis Date  . Arthritis   . Hypertension   . Asthma   . High cholesterol   . Spinal stenosis   . Chronic back pain    Past Surgical History  Procedure Laterality Date  . Knee surgery    . Kidney surgery    . Elbow surgery    . Rotator cuff repair     History reviewed. No pertinent family history. History  Substance Use Topics  . Smoking status: Former Games developer  . Smokeless tobacco: Not on file  . Alcohol Use: No   OB History   Grav Para Term Preterm Abortions TAB SAB Ect Mult Living                 Review of Systems  Constitutional: Negative for fever.  HENT: Negative for congestion, rhinorrhea and trouble swallowing.   Respiratory: Negative for cough, chest tightness and shortness of breath.    Cardiovascular: Positive for chest pain. Negative for palpitations.  Gastrointestinal: Positive for nausea. Negative for vomiting, abdominal pain, diarrhea, constipation and anorexia.  Genitourinary: Negative for dysuria and difficulty urinating.  Musculoskeletal: Negative for back pain.  Skin: Negative for color change, pallor, rash and wound.  Neurological: Positive for numbness. Negative for weakness.  All other systems reviewed and are negative.    Allergies  Flexeril; Penicillins; Percocet; and Zanaflex  Home Medications   Current Outpatient Rx  Name  Route  Sig  Dispense  Refill  . albuterol (PROAIR HFA) 108 (90 BASE) MCG/ACT inhaler   Inhalation   Inhale 2 puffs into the lungs 3 (three) times daily as needed for wheezing or shortness of breath.         . beclomethasone (QVAR) 80 MCG/ACT inhaler   Inhalation   Inhale 1 puff into the lungs daily. Wheezing         . dexlansoprazole (DEXILANT) 60 MG capsule   Oral   Take 60 mg by mouth daily.         . Estradiol Acetate (FEMRING) 0.05 MG/24HR RING   Vaginal   Place 1 each vaginally See admin instructions. Insert once every 90 days, last used 1st week of August 2014         . gabapentin (NEURONTIN) 300 MG capsule   Oral  Take 300 mg by mouth 2 (two) times daily. With lunch and at bedtime         . HYDROcodone-acetaminophen (NORCO) 10-325 MG per tablet   Oral   Take 1 tablet by mouth 2 (two) times daily.         Marland Kitchen omeprazole-sodium bicarbonate (ZEGERID) 40-1100 MG per capsule   Oral   Take 1 capsule by mouth at bedtime.         . simvastatin (ZOCOR) 20 MG tablet   Oral   Take 20 mg by mouth daily with lunch.         . valsartan-hydrochlorothiazide (DIOVAN-HCT) 160-12.5 MG per tablet   Oral   Take 1 tablet by mouth daily with lunch.           BP 146/78  Pulse 66  Temp(Src) 98.3 F (36.8 C) (Oral)  Resp 15  Wt 199 lb 14.4 oz (90.674 kg)  SpO2 100% Physical Exam  Nursing note and vitals  reviewed. Constitutional: She is oriented to person, place, and time. She appears well-developed and well-nourished. No distress.  Pleasant female in NAD  HENT:  Head: Normocephalic and atraumatic.  Mouth/Throat: Oropharynx is clear and moist. No oropharyngeal exudate.  Eyes: Conjunctivae and EOM are normal. Pupils are equal, round, and reactive to light.  Neck: Normal range of motion. Neck supple.  Cardiovascular: Normal rate, regular rhythm and normal heart sounds.  Exam reveals no gallop and no friction rub.   No murmur heard. Pulmonary/Chest: Effort normal and breath sounds normal. No respiratory distress. She has no wheezes. She has no rales. She exhibits no tenderness.  Abdominal: Soft. She exhibits no distension. There is no tenderness.  Musculoskeletal: Normal range of motion. She exhibits no edema and no tenderness.  Lymphadenopathy:    She has no cervical adenopathy.  Neurological: She is alert and oriented to person, place, and time.  Skin: Skin is warm and dry. No rash noted. She is not diaphoretic.  Psychiatric: She has a normal mood and affect. Her behavior is normal. Judgment and thought content normal.    ED Course  Procedures (including critical care time) Labs Review Labs Reviewed  COMPREHENSIVE METABOLIC PANEL - Abnormal; Notable for the following:    Glucose, Bld 110 (*)    GFR calc non Af Amer 70 (*)    GFR calc Af Amer 81 (*)    All other components within normal limits  CBC WITH DIFFERENTIAL  LIPASE, BLOOD  POCT I-STAT TROPONIN I  POCT I-STAT TROPONIN I   Imaging Review Dg Chest 2 View  06/28/2013   *RADIOLOGY REPORT*  Clinical Data: Chest pressure, radiating into left arm and jaw  CHEST - 2 VIEW  Comparison: 11/11/2011; 10/04/2008  Findings: Grossly unchanged cardiac silhouette and mediastinal contours.  No focal parenchymal opacities.  No pleural effusion or pneumothorax.  No evidence of edema.  There is mild elevation of the right hemidiaphragm. Post  lower lumbar paraspinal fusion, incompletely evaluated.  IMPRESSION: No acute cardiopulmonary disease.   Original Report Authenticated By: Tacey Ruiz, MD    Date: 06/28/2013  Rate: 72  Rhythm: normal sinus rhythm  QRS Axis: normal  Intervals: normal  ST/T Wave abnormalities: normal  Conduction Disutrbances:none  Narrative Interpretation:   Old EKG Reviewed: unchanged   MDM   1. Chest pain   2. Hypertension   3. Nausea     Patient is a 57 year old female with a history of hypertension, hyperlipidemia who presents with a left upper chest,  jaw, arm pain. She also endorses nausea. The symptoms started today approximately noon when she was getting out of the shower. They were nonexertional and not associated with shortness of breath. Chest shoulder and neck pain was described as heaviness and pressure. She also had associated numbness of the left arm with tingling. She 1 heaviness and pressure resolved after approximately 3 hours, however arm numbness and tingling it has gradually dwindled until it is barely noticeable now.   Risk factor include hypertension, hyperlipidemia, early MI with her mother having an MI at 46. Concern for ACS. EKG shows no signs of ischemia. Chest x-ray without consolidation, effusion, pneumothorax, mediastinal widening. Initial troponin negative and CBC and BMP unremarkable. Presentation not consistent with pneumonia, dissection, PE. Based on risk factors, will consult medicine for ACS rule out. Aspirin 324 given in the ED.  Cardiologist consulted for admission for ACS rule out and requested heparin as well as nitroglycerin. After further discussion with the patient, she refused heparin due to recent back surgery. Delta troponin was sent and was also negative. Based on the discussion I had with the patient, I informed her that her symptoms were typical for ACS and it is entirely possible that her symptoms could have represented a myocardial infarction. Voiced strong  emphasis that discharge would not be in her best interest and I believe that she needs provocative testing. She stated that despite that, she would prefer to go home and have this worked up as an outpatient. I asked her to discuss her reservations with the admitting cardiologist, and she agreed to do so. Following her discussion with Dr. Sharyn Lull, the patient still insisted on leaving. Anticipated repeat discussion with the patient for being discharged AMA, however prior to that discussion, the patient eloped. Patient is to follow up with her cardiologist for further evaluation.  Patient was discussed with my attending, Dr. Hyacinth Meeker.   Dorna Leitz, MD 06/28/13 [phone]

## 2013-06-28 NOTE — ED Notes (Signed)
Pt left room AMA. Pt was informed of the risk and dangers of leaving AMA. Pt refused signing elopement form. Pt and family member both verbalized understanding of the risk. Family member quoted saying "Just send me the bill", and continued to walk away. Pt was alert, steady gait using cane, respirations equal and unlabored.

## 2013-06-28 NOTE — ED Notes (Signed)
Patient left AMA.

## 2013-06-28 NOTE — ED Notes (Signed)
Pt c/o left arm pain and numbness x 2 days with some nausea and pain in left side of jaw; pt sent from [facility] for further eval

## 2013-06-29 NOTE — ED Provider Notes (Signed)
57 year old female with a history of hypertension, hyperlipidemia and whose mother died in her mid 11s because of a large heart attack. She presents with a complaint of left-sided chest pressure radiating to her left jaw and her left arm. This occurred several hours prior to arrival and has improved steadily and consistently though she is still mildly symptomatic. Her EKG was overall unremarkable without significant ST elevation or depression, her clinical exam was unremarkable with normal heart and lung sounds, lab work without any elevation in her troponin. Because of her baseline risk for acute coronary syndrome her primary doctor was consultation for admission to the hospital for further evaluation and provocative testing. Her physician also recommended the use of heparin and nitroglycerin drips as he also agreed that this could be acute coronary syndrome. The patient decided that she did not want to stay in the hospital, even after she was seen by her family doctor she still insisted on leaving, she left AGAINST MEDICAL ADVICE.  I saw and evaluated the patient, reviewed the resident's note and I agree with the findings and plan.  Diagnosis  #1 unstable angina  I personally interpreted the EKG as well as the resident and agree with the interpretation on the resident's chart.   Vida Roller, MD 06/29/13 [phone]

## 2013-07-01 ENCOUNTER — Other Ambulatory Visit (HOSPITAL_COMMUNITY): Payer: Self-pay | Admitting: Cardiology

## 2013-07-01 DIAGNOSIS — R079 Chest pain, unspecified: Secondary | ICD-10-CM

## 2013-07-06 ENCOUNTER — Other Ambulatory Visit: Payer: Self-pay

## 2013-07-06 ENCOUNTER — Encounter (HOSPITAL_COMMUNITY)
Admission: RE | Admit: 2013-07-06 | Discharge: 2013-07-06 | Disposition: A | Discharge status: Home / Self Care | Payer: BC Managed Care – PPO | Source: Ambulatory Visit | Attending: Cardiology | Admitting: Cardiology

## 2013-07-06 DIAGNOSIS — F17200 Nicotine dependence, unspecified, uncomplicated: Secondary | ICD-10-CM | POA: Insufficient documentation

## 2013-07-06 DIAGNOSIS — R079 Chest pain, unspecified: Secondary | ICD-10-CM | POA: Insufficient documentation

## 2013-07-06 DIAGNOSIS — I10 Essential (primary) hypertension: Secondary | ICD-10-CM | POA: Insufficient documentation

## 2013-07-06 HISTORY — PX: CARDIOVASCULAR STRESS TEST: SHX262

## 2013-07-06 MED ORDER — REGADENOSON 0.4 MG/5ML IV SOLN
INTRAVENOUS | Status: AC
  Administered 2013-07-06: 0.4 mg via INTRAVENOUS
  Filled 2013-07-06: qty 5

## 2013-07-06 MED ORDER — REGADENOSON 0.4 MG/5ML IV SOLN
0.4000 mg | Freq: Once | INTRAVENOUS | Status: AC
  Administered 2013-07-06: 0.4 mg via INTRAVENOUS

## 2013-07-06 MED ORDER — TECHNETIUM TC 99M SESTAMIBI GENERIC - CARDIOLITE
10.0000 | Freq: Once | INTRAVENOUS | Status: AC | PRN
  Administered 2013-07-06: 10 via INTRAVENOUS

## 2013-07-06 MED ORDER — TECHNETIUM TC 99M SESTAMIBI GENERIC - CARDIOLITE
30.0000 | Freq: Once | INTRAVENOUS | Status: AC | PRN
  Administered 2013-07-06: 30 via INTRAVENOUS

## 2013-07-28 ENCOUNTER — Other Ambulatory Visit: Payer: Self-pay

## 2014-01-31 ENCOUNTER — Encounter (INDEPENDENT_AMBULATORY_CARE_PROVIDER_SITE_OTHER): Payer: Self-pay | Admitting: General Surgery

## 2014-01-31 ENCOUNTER — Ambulatory Visit (INDEPENDENT_AMBULATORY_CARE_PROVIDER_SITE_OTHER): Payer: BC Managed Care – PPO | Admitting: General Surgery

## 2014-01-31 VITALS — BP 124/80 | HR 76 | Temp 97.6°F | Ht 66.0 in | Wt 191.0 lb

## 2014-01-31 DIAGNOSIS — K625 Hemorrhage of anus and rectum: Secondary | ICD-10-CM | POA: Insufficient documentation

## 2014-01-31 DIAGNOSIS — K648 Other hemorrhoids: Secondary | ICD-10-CM

## 2014-01-31 DIAGNOSIS — K641 Second degree hemorrhoids: Secondary | ICD-10-CM

## 2014-01-31 MED ORDER — HYDROCORTISONE ACETATE 25 MG RE SUPP: 25.0000 mg | Freq: Every day | RECTAL | Status: DC

## 2014-01-31 MED ORDER — HYDROCORTISONE ACETATE 25 MG RE SUPP: 25.0000 mg | Freq: Two times a day (BID) | RECTAL | Status: DC

## 2014-01-31 NOTE — Progress Notes (Signed)
Chief Complaint  Patient presents with  . eval hems    HISTORY: [name] is a 58 y.o. female who presents to the office with rectal bleeding.  Other symptoms include occ swelling, pain, itching.  This had been occurring for years.  She has tried prep H, A&D ointment in the past with some success.  Constipation, straining makes the symptoms worse.   It is intermittent in nature.  Her bowel habits are irregular and her bowel movements are usually soft.  Her fiber intake is minimal.  Her last colonoscopy was 6 years and neg except for hyperplastic polyp.  She is scheduled for sigmoidoscopy on Thu.  She does have prolapsing tissue.  She did have anal rectal surgery in her teens for what sounds like a abscess. This was operated on once again approximately 4 years later for something similar.   Past Medical History  Diagnosis Date  . Arthritis   . Hypertension   . Asthma   . High cholesterol   . Spinal stenosis   . Chronic back pain       Past Surgical History  Procedure Laterality Date  . Knee surgery    . Kidney surgery    . Elbow surgery    . Rotator cuff repair          Current Outpatient Prescriptions  Medication Sig Dispense Refill  . albuterol (PROAIR HFA) 108 (90 BASE) MCG/ACT inhaler Inhale 2 puffs into the lungs 3 (three) times daily as needed for wheezing or shortness of breath.      Marland Kitchen. aspirin 81 MG tablet Take 81 mg by mouth daily.      . beclomethasone (QVAR) 80 MCG/ACT inhaler Inhale 1 puff into the lungs daily. Wheezing      . dexlansoprazole (DEXILANT) 60 MG capsule Take 60 mg by mouth daily.      . Estradiol Acetate (FEMRING) 0.05 MG/24HR RING Place 1 each vaginally See admin instructions. Insert once every 90 days, last used 1st week of August 2014      . gabapentin (NEURONTIN) 300 MG capsule Take 300 mg by mouth 2 (two) times daily. With lunch and at bedtime      . HYDROcodone-acetaminophen (NORCO) 10-325 MG per tablet Take 1 tablet by mouth 2 (two) times daily.       Marland Kitchen. ibuprofen (ADVIL,MOTRIN) 100 MG tablet Take 100 mg by mouth every 6 (six) hours as needed for fever.      . Multiple Vitamin (MULTIVITAMIN) tablet Take 1 tablet by mouth daily.      Marland Kitchen. omeprazole-sodium bicarbonate (ZEGERID) 40-1100 MG per capsule Take 1 capsule by mouth at bedtime.      . pantoprazole (PROTONIX) 40 MG tablet       . simvastatin (ZOCOR) 20 MG tablet Take 20 mg by mouth daily with lunch.      . valsartan-hydrochlorothiazide (DIOVAN-HCT) 160-12.5 MG per tablet Take 1 tablet by mouth daily with lunch.       . hydrocortisone (ANUSOL-HC) 25 MG suppository Place 1 suppository (25 mg total) rectally 2 (two) times daily.  12 suppository  2   No current facility-administered medications for this visit.      Allergies  Allergen Reactions  . Flexeril [Cyclobenzaprine] Other (See Comments)    "out of it "  . Penicillins Diarrhea  . Percocet [Oxycodone-Acetaminophen] Nausea And Vomiting  . Zanaflex [Tizanidine Hcl] Other (See Comments)    hallucinations       Family History  Problem Relation Age  of Onset  . Heart disease Mother     History   Social History  . Marital Status: Married    Spouse Name: N/A    Number of Children: N/A  . Years of Education: N/A   Social History Main Topics  . Smoking status: Former Games developermoker  . Smokeless tobacco: None  . Alcohol Use: No  . Drug Use: No  . Sexual Activity: None   Other Topics Concern  . None   Social History Narrative  . None      REVIEW OF SYSTEMS - PERTINENT POSITIVES ONLY: Review of Systems - General ROS: negative for - chills, fever or weight loss Hematological and Lymphatic ROS: negative for - bleeding problems, blood clots or bruising Respiratory ROS: no cough, shortness of breath, or wheezing Cardiovascular ROS: no chest pain or dyspnea on exertion Gastrointestinal ROS: no abdominal pain, change in bowel habits, or black or bloody stools Genito-Urinary ROS: no dysuria, trouble voiding, or  hematuria  EXAM: Filed Vitals:   01/31/14 0906  BP: 124/80  Pulse: 76  Temp: 97.6 F (36.4 C)   Gen: NAD CV: RRR Lungs: CTA ABD: soft, non-tender to palpation  Procedure: Anoscopy Surgeon: Maisie Fushomas Diagnosis: rectal bleeding  Assistant: Vanessa DurhamGlenda Kendricks After the risks and benefits were explained, verbal consent was obtained for above procedure  Anesthesia: none Findings: grade 2-3 internal hemorrhoids with inflammation, L ant anal wall defect noted from previous surgeries, Minimal external tags, hard stool in rectal vault    ASSESSMENT AND PLAN:  [name] Is a 58 year old female who presents to the office with prolapsing grade 2-3 internal hemorrhoids.  I have recommended that she increase the fiber in her diet to approximately 25-30 g a day. I have also recommended that she continue with the flexible sigmoidoscopy Dr. Clent RidgesBuccinni plans to do on Thursday. I have given her a prescription for Anusol suppositories to help with inflammation and bleeding.  I will see her back in the office in 2 months for reevaluation. If she is still having bleeding, I think she would be a good candidate for hemorrhoid banding.   Virginia PandaAlicia C Jaevin Medearis, MD Colon and Rectal Surgery / General Surgery [facility]      Visit Diagnoses: 1. Rectal bleeding   2. Prolapsed internal hemorrhoids, grade 2     Primary Care Physician: Pola CornSPRUILL,JEROME O, MD

## 2014-01-31 NOTE — Patient Instructions (Signed)
HEMORRHOIDS    Did you know... Hemorrhoids are one of the most common ailments known.  More than half the population will develop hemorrhoids, usually after age 60.  Millions of Americans currently suffer from hemorrhoids.  The average person suffers in silence for a long period before seeking medical care.  Today's treatment methods make some types of hemorrhoid removal much less painful.  What are hemorrhoids? Often described as "varicose veins of the anus and rectum", hemorrhoids are enlarged, bulging blood vessels in and about the anus and lower rectum. There are two types of hemorrhoids: external and internal, which refer to their location.  External (outside) hemorrhoids develop near the anus and are covered by very sensitive skin. These are usually painless. However, if a blood clot (thrombosis) develops in an external hemorrhoid, it becomes a painful, hard lump. The external hemorrhoid may bleed if it ruptures. Internal (inside) hemorrhoids develop within the anus beneath the lining. Painless bleeding and protrusion during bowel movements are the most common symptom. However, an internal hemorrhoid can cause severe pain if it is completely "prolapsed" - protrudes from the anal opening and cannot be pushed back inside.   What causes hemorrhoids? An exact cause is unknown; however, the upright posture of humans alone forces a great deal of pressure on the rectal veins, which sometimes causes them to bulge. Other contributing factors include:  . Aging  . Chronic constipation or diarrhea  . Pregnancy  . Heredity  . Straining during bowel movements  . Faulty bowel function due to overuse of laxatives or enemas . Spending long periods of time (e.g., reading) on the toilet  Whatever the cause, the tissues supporting the vessels stretch. As a result, the vessels dilate; their walls become thin and bleed. If the stretching and pressure continue, the weakened vessels protrude.  What are the  symptoms? If you notice any of the following, you could have hemorrhoids:  . Bleeding during bowel movements  . Protrusion during bowel movements . Itching in the anal area  . Pain  . Sensitive lump(s)  How are hemorrhoids treated? Mild symptoms can be relieved frequently by increasing the amount of fiber (e.g., fruits, vegetables, breads and cereals) and fluids in the diet. Eliminating excessive straining reduces the pressure on hemorrhoids and helps prevent them from protruding. A sitz bath - sitting in plain warm water for about 10 minutes - can also provide some relief . With these measures, the pain and swelling of most symptomatic hemorrhoids will decrease in two to seven days, and the firm lump should recede within four to six weeks. In cases of severe or persistent pain from a thrombosed hemorrhoid, your physician may elect to remove the hemorrhoid containing the clot with a small incision. Performed under local anesthesia as an outpatient, this procedure generally provides relief. Severe hemorrhoids may require special treatment, much of which can be performed on an outpatient basis.  . Ligation - the rubber band treatment - works effectively on internal hemorrhoids that protrude with bowel movements. A small rubber band is placed over the hemorrhoid, cutting off its blood supply. The hemorrhoid and the band fall off in a few days and the wound usually heals in a week or two. This procedure sometimes produces mild discomfort and bleeding and may need to be repeated for a full effect.  There is a more intense version of this procedure that is done in the OR as outpatient surgery called THD.  It involves identifying blood vessels leading to the   hemorrhoids and then tying them off with sutures.  This method is a little more painful than rubber band ligation but less painful than traditional hemorrhoidectomy and usually does not have to be repeated.  It is best for internal hemorrhoids that  bleed.  Rubber Band Ligation of Internal Hemorrhoids:  A.  Bulging, bleeding, internal hemorrhoid B.  Rubber band applied at the base of the hemorrhoid C.  About 7 days later, the banded hemorrhoid has fallen off leaving a small scar (arrow)  . Injection and Coagulation can also be used on bleeding hemorrhoids that do not protrude. Both methods are relatively painless and cause the hemorrhoid to shrivel up. . Hemorrhoidectomy - surgery to remove the hemorrhoids - is the most complete method for removal of internal and external hemorrhoids. It is necessary when (1) clots repeatedly form in external hemorrhoids; (2) ligation fails to treat internal hemorrhoids; (3) the protruding hemorrhoid cannot be reduced; or (4) there is persistent bleeding. A hemorrhoidectomy removes excessive tissue that causes the bleeding and protrusion. It is done under anesthesia using sutures, and may, depending upon circumstances, require hospitalization and a period of inactivity. Laser hemorrhoidectomies do not offer any advantage over standard operative techniques. They are also quite expensive, and contrary to popular belief, are no less painful.  Do hemorrhoids lead to cancer? No. There is no relationship between hemorrhoids and cancer. However, the symptoms of hemorrhoids, particularly bleeding, are similar to those of colorectal cancer and other diseases of the digestive system. Therefore, it is important that all symptoms are investigated by a physician specially trained in treating diseases of the colon and rectum and that everyone 50 years or older undergo screening tests for colorectal cancer. Do not rely on over-the-counter medications or other self-treatments. See a colorectal surgeon first so your symptoms can be properly evaluated and effective treatment prescribed.  2012 American Society of Colon & Rectal Surgeons    Fiber Chart  You should 25-30g of fiber per day and drinking 8 glasses of water to help  your bowels move regularly.  In the chart below you can look up how much fiber you are getting in an average day.  If you are not getting enough fiber, you should add a fiber supplement to your diet.  Examples of this include Metamucil, FiberCon and Citrucel.  These can be purchased at your local grocery store or pharmacy.      http://www.[employer].edu/offices/health/nutritioncoach/AtoZ/handouts/Fiber.pdf    GETTING TO GOOD BOWEL HEALTH. Irregular bowel habits such as constipation can lead to many problems over time.  Having one soft bowel movement a day is the most important way to prevent further problems.  The anorectal canal is designed to handle stretching and feces to safely manage our ability to get rid of solid waste (feces, poop, stool) out of our body.  BUT, hard constipated stools can act like ripping concrete bricks causing inflamed hemorrhoids, anal fissures, abdominal pain and bloating.     The goal: ONE SOFT BOWEL MOVEMENT A DAY!  To have soft, regular bowel movements:    Drink at least 8 tall glasses of water a day.     Take plenty of fiber.  Fiber is the undigested part of plant food that passes into the colon, acting s "natures broom" to encourage bowel motility and movement.  Fiber can absorb and hold large amounts of water. This results in a larger, bulkier stool, which is soft and easier to pass. Work gradually over several weeks up to 6 servings a   day of fiber (25g a day even more if needed) in the form of: o Vegetables -- Root (potatoes, carrots, turnips), leafy green (lettuce, salad greens, celery, spinach), or cooked high residue (cabbage, broccoli, etc) o Fruit -- Fresh (unpeeled skin & pulp), Dried (prunes, apricots, cherries, etc ),  or stewed ( applesauce)  o Whole grain breads, pasta, etc (whole wheat)  o Bran cereals    Bulking Agents -- This type of water-retaining fiber generally is easily obtained each day by one of the following:  o Psyllium bran -- The psyllium  plant is remarkable because its ground seeds can retain so much water. This product is available as Metamucil, Konsyl, Effersyllium, Per Diem Fiber, or the less expensive generic preparation in drug and health food stores. Although labeled a laxative, it really is not a laxative.  o Methylcellulose -- This is another fiber derived from wood which also retains water. It is available as Citrucel. o Polyethylene Glycol - and "artificial" fiber commonly called Miralax or Glycolax.  It is helpful for people with gassy or bloated feelings with regular fiber o Flax Seed - a less gassy fiber than psyllium   No reading or other relaxing activity while on the toilet. If bowel movements take longer than 5 minutes, you are too constipated.   AVOID CONSTIPATION.  High fiber and water intake usually takes care of this.  Sometimes a laxative is needed to stimulate more frequent bowel movements, but    Laxatives are not a good long-term solution as it can wear the colon out. o Osmotics (Milk of Magnesia, Fleets phosphosoda, Magnesium citrate, MiraLax, GoLytely) are safer than  o Stimulants (Senokot, Castor Oil, Dulcolax, Ex Lax)    o Do not take laxatives for more than 7days in a row.    IF SEVERELY CONSTIPATED, try a Bowel Retraining Program: o Do not use laxatives.  o Eat a diet high in roughage, such as bran cereals and leafy vegetables.  o Drink six (6) ounces of prune or apricot juice each morning.  o Eat two (2) large servings of stewed fruit each day.  o Take one (1) heaping tablespoon of a psyllium-based bulking agent twice a day. Use sugar-free sweetener when possible to avoid excessive calories.  o Eat a normal breakfast.  o Set aside 15 minutes after breakfast to sit on the toilet, but do not strain to have a bowel movement.  o If you do not have a bowel movement by the third day, use an enema and repeat the above steps.    

## 2014-02-02 HISTORY — PX: SIGMOIDOSCOPY: SUR1295

## 2014-04-04 ENCOUNTER — Encounter (INDEPENDENT_AMBULATORY_CARE_PROVIDER_SITE_OTHER): Payer: BC Managed Care – PPO | Admitting: General Surgery

## 2014-06-28 ENCOUNTER — Other Ambulatory Visit: Payer: Self-pay | Admitting: Gastroenterology

## 2014-06-28 DIAGNOSIS — R1011 Right upper quadrant pain: Secondary | ICD-10-CM

## 2014-06-28 DIAGNOSIS — R110 Nausea: Secondary | ICD-10-CM

## 2014-07-06 ENCOUNTER — Ambulatory Visit
Admission: RE | Admit: 2014-07-06 | Discharge: 2014-07-06 | Disposition: A | Discharge status: Home / Self Care | Payer: BC Managed Care – PPO | Source: Ambulatory Visit | Attending: Gastroenterology | Admitting: Gastroenterology

## 2014-07-06 DIAGNOSIS — R110 Nausea: Secondary | ICD-10-CM

## 2014-07-06 DIAGNOSIS — R1011 Right upper quadrant pain: Secondary | ICD-10-CM

## 2014-07-07 ENCOUNTER — Other Ambulatory Visit: Payer: Self-pay

## 2014-07-19 ENCOUNTER — Other Ambulatory Visit: Payer: Self-pay | Admitting: Gastroenterology

## 2014-08-03 ENCOUNTER — Other Ambulatory Visit: Payer: Self-pay | Admitting: Gynecology

## 2014-08-04 LAB — CYTOLOGY - PAP

## 2015-05-08 ENCOUNTER — Encounter (HOSPITAL_COMMUNITY): Payer: Self-pay

## 2015-05-08 ENCOUNTER — Other Ambulatory Visit: Payer: Self-pay

## 2015-05-08 ENCOUNTER — Encounter (HOSPITAL_COMMUNITY)
Admission: RE | Admit: 2015-05-08 | Discharge: 2015-05-08 | Disposition: A | Discharge status: Home / Self Care | Payer: 59 | Source: Ambulatory Visit | Attending: Orthopedic Surgery | Admitting: Orthopedic Surgery

## 2015-05-08 DIAGNOSIS — I10 Essential (primary) hypertension: Secondary | ICD-10-CM | POA: Diagnosis not present

## 2015-05-08 DIAGNOSIS — M179 Osteoarthritis of knee, unspecified: Secondary | ICD-10-CM | POA: Diagnosis not present

## 2015-05-08 DIAGNOSIS — Z01810 Encounter for preprocedural cardiovascular examination: Secondary | ICD-10-CM | POA: Diagnosis not present

## 2015-05-08 DIAGNOSIS — Z01812 Encounter for preprocedural laboratory examination: Secondary | ICD-10-CM | POA: Insufficient documentation

## 2015-05-08 HISTORY — DX: Gastro-esophageal reflux disease without esophagitis: K21.9

## 2015-05-08 HISTORY — DX: Nausea with vomiting, unspecified: R11.2

## 2015-05-08 HISTORY — DX: Fibromyalgia: M79.7

## 2015-05-08 HISTORY — DX: Nausea with vomiting, unspecified: Z98.890

## 2015-05-08 LAB — CBC
HCT: 39.7 % (ref 36.0–46.0)
Hemoglobin: 13.2 g/dL (ref 12.0–15.0)
MCH: 30.2 pg (ref 26.0–34.0)
MCHC: 33.2 g/dL (ref 30.0–36.0)
MCV: 90.8 fL (ref 78.0–100.0)
Platelets: 164 10*3/uL (ref 150–400)
RBC: 4.37 MIL/uL (ref 3.87–5.11)
RDW: 12.2 % (ref 11.5–15.5)
WBC: 8.4 10*3/uL (ref 4.0–10.5)

## 2015-05-08 LAB — BASIC METABOLIC PANEL
Anion gap: 9 (ref 5–15)
BUN: 13 mg/dL (ref 6–20)
CALCIUM: 9.5 mg/dL (ref 8.9–10.3)
CO2: 28 mmol/L (ref 22–32)
CREATININE: 1.07 mg/dL — AB (ref 0.44–1.00)
Chloride: 101 mmol/L (ref 101–111)
GFR calc non Af Amer: 55 mL/min — ABNORMAL LOW (ref >60)
Glucose, Bld: 135 mg/dL — ABNORMAL HIGH (ref 65–99)
Potassium: 3.3 mmol/L — ABNORMAL LOW (ref 3.5–5.1)
SODIUM: 138 mmol/L (ref 135–145)

## 2015-05-08 LAB — GLUCOSE, CAPILLARY: Glucose-Capillary: 152 mg/dL — ABNORMAL HIGH (ref 65–99)

## 2015-05-08 NOTE — Pre-Procedure Instructions (Signed)
    Akeylah Hendel Moes  05/08/2015      [facility] [facility] [address] - [place], [place] - [address] [facility] [address] [place] [address] Phone: [phone] Fax: [phone]    Your procedure is scheduled on 05/18/15.  Report to [facility] Admitting at 2 pm  Call this number if you have problems the morning of surgery:  [phone]   Remember:  Do not eat food or drink liquids after midnight.  Take these medicines the morning of surgery with A SIP OF WATER --all inhalers,neurontin,hydrocodone,protonix   Do not wear jewelry, make-up or nail polish.  Do not wear lotions, powders, or perfumes.  You may wear deodorant.  Do not shave 48 hours prior to surgery.  Men may shave face and neck.  Do not bring valuables to the hospital.  [facility] is not responsible for any belongings or valuables.  Contacts, dentures or bridgework may not be worn into surgery.  Leave your suitcase in the car.  After surgery it may be brought to your room.  For patients admitted to the hospital, discharge time will be determined by your treatment team.  Patients discharged the day of surgery will not be allowed to drive home.   Name and phone number of your driver:    Special instructions:   Please read over the following fact sheets that you were given. Pain Booklet, Coughing and Deep Breathing and Surgical Site Infection Prevention

## 2015-05-15 ENCOUNTER — Encounter (HOSPITAL_COMMUNITY): Payer: Self-pay | Admitting: *Deleted

## 2015-05-16 ENCOUNTER — Encounter (HOSPITAL_BASED_OUTPATIENT_CLINIC_OR_DEPARTMENT_OTHER): Payer: Self-pay | Admitting: *Deleted

## 2015-05-16 NOTE — Progress Notes (Signed)
NPO AFTER MN.  ARRIVE AT 1045.  CURRENT LAB RESULTS AND EKG IN CHART AND EPIC.  WILL TAKE PROTONIX AND DO ALVESCO INHALER AM DOS W/ SIPS OF WATER.

## 2015-05-18 ENCOUNTER — Ambulatory Visit (HOSPITAL_COMMUNITY)
Admission: RE | Admit: 2015-05-18 | Discharge: 2015-05-18 | Disposition: A | Discharge status: Home / Self Care | Payer: 59 | Source: Ambulatory Visit | Attending: Orthopedic Surgery | Admitting: Orthopedic Surgery

## 2015-05-18 ENCOUNTER — Encounter (HOSPITAL_BASED_OUTPATIENT_CLINIC_OR_DEPARTMENT_OTHER)
Admission: RE | Disposition: A | Discharge status: Home / Self Care | Payer: Self-pay | Source: Ambulatory Visit | Attending: Orthopedic Surgery

## 2015-05-18 ENCOUNTER — Ambulatory Visit (HOSPITAL_BASED_OUTPATIENT_CLINIC_OR_DEPARTMENT_OTHER): Payer: 59 | Admitting: Anesthesiology

## 2015-05-18 ENCOUNTER — Encounter (HOSPITAL_BASED_OUTPATIENT_CLINIC_OR_DEPARTMENT_OTHER): Payer: Self-pay | Admitting: *Deleted

## 2015-05-18 DIAGNOSIS — K219 Gastro-esophageal reflux disease without esophagitis: Secondary | ICD-10-CM | POA: Insufficient documentation

## 2015-05-18 DIAGNOSIS — J45909 Unspecified asthma, uncomplicated: Secondary | ICD-10-CM | POA: Insufficient documentation

## 2015-05-18 DIAGNOSIS — M23321 Other meniscus derangements, posterior horn of medial meniscus, right knee: Secondary | ICD-10-CM | POA: Diagnosis not present

## 2015-05-18 DIAGNOSIS — M23351 Other meniscus derangements, posterior horn of lateral meniscus, right knee: Secondary | ICD-10-CM | POA: Diagnosis not present

## 2015-05-18 DIAGNOSIS — M23303 Other meniscus derangements, unspecified medial meniscus, right knee: Secondary | ICD-10-CM | POA: Diagnosis present

## 2015-05-18 DIAGNOSIS — I10 Essential (primary) hypertension: Secondary | ICD-10-CM | POA: Insufficient documentation

## 2015-05-18 DIAGNOSIS — E785 Hyperlipidemia, unspecified: Secondary | ICD-10-CM | POA: Diagnosis not present

## 2015-05-18 DIAGNOSIS — E119 Type 2 diabetes mellitus without complications: Secondary | ICD-10-CM | POA: Insufficient documentation

## 2015-05-18 DIAGNOSIS — Z87891 Personal history of nicotine dependence: Secondary | ICD-10-CM | POA: Insufficient documentation

## 2015-05-18 DIAGNOSIS — M797 Fibromyalgia: Secondary | ICD-10-CM | POA: Diagnosis not present

## 2015-05-18 HISTORY — DX: Other intervertebral disc degeneration, lumbar region: M51.36

## 2015-05-18 HISTORY — DX: Type 2 diabetes mellitus without complications: E11.9

## 2015-05-18 HISTORY — DX: Other intervertebral disc degeneration, lumbar region without mention of lumbar back pain or lower extremity pain: M51.369

## 2015-05-18 HISTORY — PX: KNEE ARTHROSCOPY WITH LATERAL MENISECTOMY: SHX6193

## 2015-05-18 HISTORY — DX: Personal history of colonic polyps: Z86.010

## 2015-05-18 HISTORY — DX: Hyperlipidemia, unspecified: E78.5

## 2015-05-18 HISTORY — PX: KNEE ARTHROSCOPY WITH MEDIAL MENISECTOMY: SHX5651

## 2015-05-18 HISTORY — DX: Unspecified hemorrhoids: K64.9

## 2015-05-18 HISTORY — DX: Unspecified tear of unspecified meniscus, current injury, right knee, initial encounter: S83.206A

## 2015-05-18 HISTORY — DX: Personal history of colon polyps, unspecified: Z86.0100

## 2015-05-18 LAB — GLUCOSE, CAPILLARY
GLUCOSE-CAPILLARY: 212 mg/dL — AB (ref 65–99)
Glucose-Capillary: 94 mg/dL (ref 65–99)
Glucose-Capillary: 201 mg/dL — ABNORMAL HIGH (ref 65–99)

## 2015-05-18 SURGERY — ARTHROSCOPY, KNEE, WITH MEDIAL MENISCECTOMY
Anesthesia: General | Site: Knee | Laterality: Right

## 2015-05-18 MED ORDER — LACTATED RINGERS IV SOLN
INTRAVENOUS | Status: DC
  Administered 2015-05-18: 14:00:00 via INTRAVENOUS
  Filled 2015-05-18: qty 1000

## 2015-05-18 MED ORDER — FENTANYL CITRATE (PF) 100 MCG/2ML IJ SOLN
INTRAMUSCULAR | Status: DC | PRN
  Administered 2015-05-18 (×8): 25 ug via INTRAVENOUS

## 2015-05-18 MED ORDER — FENTANYL CITRATE (PF) 100 MCG/2ML IJ SOLN
25.0000 ug | INTRAMUSCULAR | Status: DC | PRN
  Administered 2015-05-18 (×2): 25 ug via INTRAVENOUS
  Filled 2015-05-18: qty 1

## 2015-05-18 MED ORDER — ACETAMINOPHEN 10 MG/ML IV SOLN
INTRAVENOUS | Status: DC | PRN
  Administered 2015-05-18: 1000 mg via INTRAVENOUS

## 2015-05-18 MED ORDER — ONDANSETRON HCL 4 MG PO TABS: 4.0000 mg | ORAL_TABLET | Freq: Three times a day (TID) | ORAL | Status: DC | PRN

## 2015-05-18 MED ORDER — SODIUM CHLORIDE 0.9 % IR SOLN
Status: DC | PRN
  Administered 2015-05-18 (×2): 3000 mL

## 2015-05-18 MED ORDER — LACTATED RINGERS IV SOLN
INTRAVENOUS | Status: DC
  Administered 2015-05-18: 16:00:00 via INTRAVENOUS
  Filled 2015-05-18: qty 1000

## 2015-05-18 MED ORDER — HYDROCODONE-ACETAMINOPHEN 5-325 MG PO TABS: 1.0000 | ORAL_TABLET | Freq: Four times a day (QID) | ORAL | Status: DC | PRN

## 2015-05-18 MED ORDER — LIDOCAINE-EPINEPHRINE (PF) 1 %-1:200000 IJ SOLN
INTRAMUSCULAR | Status: DC | PRN
Start: 2015-05-18 — End: 2015-05-18
  Administered 2015-05-18: 30 mL

## 2015-05-18 MED ORDER — CEFAZOLIN SODIUM-DEXTROSE 2-3 GM-% IV SOLR
INTRAVENOUS | Status: AC
  Filled 2015-05-18: qty 50

## 2015-05-18 MED ORDER — MIDAZOLAM HCL 5 MG/5ML IJ SOLN
INTRAMUSCULAR | Status: DC | PRN
Start: 2015-05-18 — End: 2015-05-18
  Administered 2015-05-18 (×2): 1 mg via INTRAVENOUS

## 2015-05-18 MED ORDER — ONDANSETRON HCL 4 MG/2ML IJ SOLN
INTRAMUSCULAR | Status: DC | PRN
  Administered 2015-05-18: 4 mg via INTRAVENOUS

## 2015-05-18 MED ORDER — HYDROCODONE-ACETAMINOPHEN 5-325 MG PO TABS
1.0000 | ORAL_TABLET | Freq: Four times a day (QID) | ORAL | Status: DC | PRN
  Administered 2015-05-18: 1 via ORAL
  Filled 2015-05-18: qty 1

## 2015-05-18 MED ORDER — CHLORHEXIDINE GLUCONATE 4 % EX LIQD
60.0000 mL | Freq: Once | CUTANEOUS | Status: DC
  Filled 2015-05-18: qty 60

## 2015-05-18 MED ORDER — KETOROLAC TROMETHAMINE 30 MG/ML IJ SOLN
INTRAMUSCULAR | Status: DC | PRN
  Administered 2015-05-18: 30 mg via INTRAVENOUS

## 2015-05-18 MED ORDER — CEFAZOLIN SODIUM-DEXTROSE 2-3 GM-% IV SOLR
2.0000 g | INTRAVENOUS | Status: DC
  Filled 2015-05-18: qty 50

## 2015-05-18 MED ORDER — CLINDAMYCIN PHOSPHATE 600 MG/50ML IV SOLN
INTRAVENOUS | Status: AC
  Filled 2015-05-18: qty 50

## 2015-05-18 MED ORDER — PROMETHAZINE HCL 25 MG/ML IJ SOLN
6.2500 mg | INTRAMUSCULAR | Status: DC | PRN
  Filled 2015-05-18: qty 1

## 2015-05-18 MED ORDER — MEPERIDINE HCL 25 MG/ML IJ SOLN
6.2500 mg | INTRAMUSCULAR | Status: DC | PRN
  Filled 2015-05-18: qty 1

## 2015-05-18 MED ORDER — HYDROCODONE-ACETAMINOPHEN 5-325 MG PO TABS
ORAL_TABLET | ORAL | Status: AC
Start: 2015-05-18 — End: 2015-05-18
  Filled 2015-05-18: qty 1

## 2015-05-18 MED ORDER — MELOXICAM 7.5 MG PO TABS: 15.0000 mg | ORAL_TABLET | Freq: Every day | ORAL | Status: DC

## 2015-05-18 MED ORDER — LIDOCAINE HCL (CARDIAC) 20 MG/ML IV SOLN
INTRAVENOUS | Status: DC | PRN
  Administered 2015-05-18: 100 mg via INTRAVENOUS

## 2015-05-18 MED ORDER — MIDAZOLAM HCL 2 MG/2ML IJ SOLN
INTRAMUSCULAR | Status: AC
  Filled 2015-05-18: qty 2

## 2015-05-18 MED ORDER — CLINDAMYCIN PHOSPHATE 600 MG/50ML IV SOLN
INTRAVENOUS | Status: DC | PRN
  Administered 2015-05-18: 600 mg via INTRAVENOUS

## 2015-05-18 MED ORDER — FENTANYL CITRATE (PF) 100 MCG/2ML IJ SOLN
INTRAMUSCULAR | Status: AC
  Filled 2015-05-18: qty 2

## 2015-05-18 MED ORDER — FENTANYL CITRATE (PF) 100 MCG/2ML IJ SOLN
INTRAMUSCULAR | Status: AC
  Filled 2015-05-18: qty 4

## 2015-05-18 MED ORDER — PROPOFOL 10 MG/ML IV BOLUS
INTRAVENOUS | Status: DC | PRN
  Administered 2015-05-18: 200 mg via INTRAVENOUS

## 2015-05-18 MED ORDER — DEXAMETHASONE SODIUM PHOSPHATE 10 MG/ML IJ SOLN
INTRAMUSCULAR | Status: DC | PRN
  Administered 2015-05-18: 10 mg via INTRAVENOUS

## 2015-05-18 MED ORDER — BUPIVACAINE-EPINEPHRINE 0.25% -1:200000 IJ SOLN
INTRAMUSCULAR | Status: DC | PRN
  Administered 2015-05-18: 30 mL

## 2015-05-18 SURGICAL SUPPLY — 34 items
BANDAGE ELASTIC 6 VELCRO ST LF (GAUZE/BANDAGES/DRESSINGS) ×2 IMPLANT
BLADE 4.2CUDA (BLADE) IMPLANT
BLADE CUDA SHAVER 3.5 (BLADE) ×2 IMPLANT
BNDG GAUZE ELAST 4 BULKY (GAUZE/BANDAGES/DRESSINGS) ×1 IMPLANT
CANISTER SUCT LVC 12 LTR MEDI- (MISCELLANEOUS) ×2 IMPLANT
CANISTER SUCTION 2500CC (MISCELLANEOUS) ×2 IMPLANT
CLOTH BEACON ORANGE TIMEOUT ST (SAFETY) ×2 IMPLANT
DRAPE ARTHROSCOPY W/POUCH 114 (DRAPES) ×2 IMPLANT
DRSG EMULSION OIL 3X3 NADH (GAUZE/BANDAGES/DRESSINGS) ×2 IMPLANT
DURAPREP 26ML APPLICATOR (WOUND CARE) ×2 IMPLANT
ELECT MENISCUS 165MM 90D (ELECTRODE) IMPLANT
ELECT REM PT RETURN 9FT ADLT (ELECTROSURGICAL)
ELECTRODE REM PT RTRN 9FT ADLT (ELECTROSURGICAL) IMPLANT
GLOVE BIO SURGEON STRL SZ7.5 (GLOVE) ×2 IMPLANT
GLOVE BIOGEL PI IND STRL 7.5 (GLOVE) IMPLANT
GLOVE BIOGEL PI INDICATOR 7.5 (GLOVE) ×2
GOWN STRL REUS W/ TWL LRG LVL3 (GOWN DISPOSABLE) ×1 IMPLANT
GOWN STRL REUS W/TWL LRG LVL3 (GOWN DISPOSABLE) ×2
GOWN STRL REUS W/TWL XL LVL3 (GOWN DISPOSABLE) ×2 IMPLANT
KNEE WRAP E Z 3 GEL PACK (MISCELLANEOUS) ×2 IMPLANT
MANIFOLD NEPTUNE II (INSTRUMENTS) IMPLANT
PACK ARTHROSCOPY DSU (CUSTOM PROCEDURE TRAY) ×2 IMPLANT
PACK BASIN DAY SURGERY FS (CUSTOM PROCEDURE TRAY) ×2 IMPLANT
PENCIL BUTTON HOLSTER BLD 10FT (ELECTRODE) IMPLANT
SET ARTHROSCOPY TUBING (MISCELLANEOUS) ×2
SET ARTHROSCOPY TUBING LN (MISCELLANEOUS) ×1 IMPLANT
SPONGE GAUZE 4X4 12PLY (GAUZE/BANDAGES/DRESSINGS) ×2 IMPLANT
SPONGE GAUZE 4X4 12PLY STER LF (GAUZE/BANDAGES/DRESSINGS) ×1 IMPLANT
SUT ETHILON 4 0 PS 2 18 (SUTURE) ×2 IMPLANT
SYR 30ML LL (SYRINGE) ×2 IMPLANT
SYR CONTROL 10ML LL (SYRINGE) ×2 IMPLANT
TOWEL OR 17X24 6PK STRL BLUE (TOWEL DISPOSABLE) ×2 IMPLANT
TUBE CONNECTING 12X1/4 (SUCTIONS) ×2 IMPLANT
WATER STERILE IRR 500ML POUR (IV SOLUTION) ×2 IMPLANT

## 2015-05-18 NOTE — Brief Op Note (Signed)
05/18/2015  2:39 PM  PATIENT:  [name]  58 y.o. female  PRE-OPERATIVE DIAGNOSIS:  RIGHT KNEE MEDIAL MENISCUS TEAR, degenerative joint disease  POST-OPERATIVE DIAGNOSIS:  Right knee medial and lateral meniscal tears  PROCEDURE:  Procedure(s): RIGHT ARTHROSCOPY KNEE WITH DEBRIDEMENT (Right) KNEE ARTHROSCOPY WITH MEDIAL MENISECTOMY (Right) KNEE ARTHROSCOPY WITH LATERAL MENISECTOMY (Right)  SURGEON:  Surgeon(s) and Role:    * Durene Romans, MD - Primary  PHYSICIAN ASSISTANT: None  ANESTHESIA:   general  EBL:  Total I/O In: 400 [I.V.:400] Out: -   BLOOD ADMINISTERED:none  DRAINS: none   LOCAL MEDICATIONS USED:  MARCAINE     SPECIMEN:  No Specimen  DISPOSITION OF SPECIMEN:  N/A  COUNTS:  YES  TOURNIQUET:  * No tourniquets in log *  DICTATION: .Other Dictation: Dictation Number [id number]  PLAN OF CARE: Discharge to home after PACU  PATIENT DISPOSITION:  PACU - hemodynamically stable.   Delay start of Pharmacological VTE agent (>24hrs) due to surgical blood loss or risk of bleeding: no

## 2015-05-18 NOTE — Discharge Instructions (Signed)
Keep surgical wounds dry until sutures out at first visit May shower whenever just keep wounds covered to keep as dry as possible   Post Anesthesia Home Care Instructions  Activity: Get plenty of rest for the remainder of the day. A responsible adult should stay with you for 24 hours following the procedure.  For the next 24 hours, DO NOT: -Drive a car -Advertising copywriter -Drink alcoholic beverages -Take any medication unless instructed by your physician -Make any legal decisions or sign important papers.  Meals: Start with liquid foods such as gelatin or soup. Progress to regular foods as tolerated. Avoid greasy, spicy, heavy foods. If nausea and/or vomiting occur, drink only clear liquids until the nausea and/or vomiting subsides. Call your physician if vomiting continues.  Special Instructions/Symptoms: Your throat may feel dry or sore from the anesthesia or the breathing tube placed in your throat during surgery. If this causes discomfort, gargle with warm salt water. The discomfort should disappear within 24 hours.  If you had a scopolamine patch placed behind your ear for the management of post- operative nausea and/or vomiting:  1. The medication in the patch is effective for 72 hours, after which it should be removed.  Wrap patch in a tissue and discard in the trash. Wash hands thoroughly with soap and water. 2. You may remove the patch earlier than 72 hours if you experience unpleasant side effects which may include dry mouth, dizziness or visual disturbances. 3. Avoid touching the patch. Wash your hands with soap and water after contact with the patch.   ARTHROSCOPIC KNEE SURGERY HOME CARE INSTRUCTIONS   PAIN You will be expected to have a moderate amount of pain in the affected knee for approximately two weeks.  However, the first two to four days will be the most severe in terms of the pain you will experience.  Prescriptions have been provided for you to take as needed for  the pain.  The pain can be markedly reduced by using the ice/compressive bandage given.  Exchange the ice packs whenever they thaw.  During the night, keep the bandage on because it will still provide some compression for the swelling.  Also, keep the leg elevated on pillows above your heart, and this will help alleviate the pain and swelling.  MEDICATION Prescriptions have been provided to take as needed for pain. To prevent blood clots, take Aspirin 325mg  daily with a meal if not on a blood thinner and if no history of stomach ulcers.  ACTIVITY It is preferred that you stay on bedrest for approximately 24 hours.  However, you may go to the bathroom with help.  After this, you can start to be up and about progressively more.  Remember that the swelling may still increase after three to four days if you are up and doing too much.  You may put as much weight on the affected leg as pain will allow.  Use your crutches for comfort and safety.  However, as soon as you are able, you may discard the crutches and go without them.   DRESSING Keep the current dressing as dry as possible.  Two days after your surgery, you may remove the ice/compressive wrap, and surgical dressing.  You may now take a shower, but do not scrub the sounds directly with soap.  Let water rinse over these and gently wipe with your hand.  Reapply band-aids over the puncture wounds and more gauze if needed.  A slight amount of thin drainage can  be normal at this time, and do not let it frighten you.  Reapply the ice/compressive wrap.  You may now repeat this every day each time you shower.  SYMPTOMS TO REPORT TO YOUR DOCTOR  -Extreme pain.  -Extreme swelling.  -Temperature above 101 degrees that does not come down with acetaminophen     (Tylenol).  -Any changes in the feeling, color or movement of your toes.  -Extreme redness, heat, swelling or drainage at your incision  EXERCISE It is preferred that you begin to exercise on the day  of your surgery.  Straight leg raises and short arc quads should be begun the afternoon or evening of surgery and continued until you come back for your follow-up appointment.   Attached is an instruction sheet on how to perform these two simple exercises.  Do these at least three times per day if not more.  You may bend your knee as much as is comfortable.  The puncture wounds may occasionally be slightly uncomfortable with bending of the knee.  Do not let this frighten you.  It is important to keep your knee motion, but do not overdo it.  If you have significant pain, simply do not bend the knee as far.   You will be given more exercises to perform at your first return visit.    RETURN APPOINTMENT Please make an appointment to be seen by your doctor in 10  days from your surgery.  Patient Signature:  ________________________________________________________  Nurse's Signature:  ________________________________________________________

## 2015-05-18 NOTE — H&P (Signed)
CC- [name] is a 59 y.o. female who presents with right knee pain.  HPI- . Knee Pain: Patient presents for follow up on a knee problem involving the  right knee. Onset of the symptoms was several months ago. Inciting event: this is a longstanding problem which has been getting worse. Current symptoms include giving out, pain located medially along knee, stiffness and swelling. Pain is aggravated by lateral movements, pivoting and walking.  Patient has had no prior knee problems. Evaluation to date: plain films: abnormal with some medial and patellofemoral joint space narrowing and MRI: abnormal with medial meniscal degenerative tearing. Treatment to date: avoidance of offending activity, corticosteroid injection which was not very effective, OTC analgesics which are ineffective, prescription NSAIDS which are not very effective and PT which was ineffective.  Past Medical History  Diagnosis Date  . Hypertension   . Asthma   . Chronic back pain   . PONV (postoperative nausea and vomiting)   . GERD (gastroesophageal reflux disease)   . Fibromyalgia   . Hyperlipidemia   . Diabetes mellitus type 2, diet-controlled   . History of colon polyps     2009-- BENIGN  . Hemorrhoids     INTERNAL AND EXTERNAL  . Right knee meniscal tear   . Arthritis     RIGHT KNEE  . DDD (degenerative disc disease), lumbar     Past Surgical History  Procedure Laterality Date  . Shoulder arthroscopy w/ subacromial decompression and distal clavicle excision Left 07-07-2007    and ROTATOR CUFF REPAIR  . Robotic assited partial nephrectomy Left 10-16-2008    oncocytoma ( negative neoplasm)  . Reconstruction of elbow Bilateral right 06-03-2005  &  left  06-12-2011  . Pulley release right long and small fingers  01-30-2006  . Knee arthroscopy Right 10-14-2006  . Total abdominal hysterectomy w/ bilateral salpingoophorectomy  1979  . Sigmoidoscopy  02-02-2014  . Colonoscopy w/ polypectomy  01-10-2008  .  Cardiovascular stress test  07-06-2013    no ischemia or infarct/  normal LV function and wall motion , ef 63%    Prior to Admission medications   Medication Sig Start Date End Date Taking? Authorizing Provider  albuterol (PROAIR HFA) 108 (90 BASE) MCG/ACT inhaler Inhale 2 puffs into the lungs 3 (three) times daily as needed for wheezing or shortness of breath.   Yes Historical Provider, MD  ALVESCO 80 MCG/ACT inhaler Inhale 1 puff into the lungs 2 (two) times daily.  05/03/15  Yes Historical Provider, MD  atorvastatin (LIPITOR) 40 MG tablet Take 40 mg by mouth every evening.    Yes Historical Provider, MD  baclofen (LIORESAL) 10 MG tablet Take 10 mg by mouth 3 (three) times daily as needed for muscle spasms.   Yes Historical Provider, MD  gabapentin (NEURONTIN) 300 MG capsule Take 300 mg by mouth 2 (two) times daily. With lunch and at bedtime   Yes Historical Provider, MD  HYDROcodone-acetaminophen (NORCO/VICODIN) 5-325 MG per tablet Take 1 tablet by mouth every 6 (six) hours as needed for moderate pain.   Yes Historical Provider, MD  ketotifen (ZADITOR) 0.025 % ophthalmic solution Place 1 drop into both eyes as needed (allergies).   Yes Historical Provider, MD  pantoprazole (PROTONIX) 40 MG tablet Take 40 mg by mouth 2 (two) times daily.  01/05/14  Yes Historical Provider, MD  valsartan-hydrochlorothiazide (DIOVAN-HCT) 320-25 MG per tablet Take 1 tablet by mouth every evening.    Yes Historical Provider, MD  Vitamin D, Ergocalciferol, (DRISDOL)  50000 UNITS CAPS capsule Take 50,000 Units by mouth every 7 (seven) days.   Yes Historical Provider, MD    antalgic gait, soft tissue tenderness over medial joint line, reduced range of motion, collateral ligaments intact, normal ipsilateral hip exam, normal contralateral knee exam  Physical Examination: General appearance - alert, well appearing, and in no distress Mental status - alert, oriented to person, place, and time Neck - supple, no significant  adenopathy Chest - clear to auscultation, no wheezes, rales or rhonchi, symmetric air entry Heart - normal rate and regular rhythm Abdomen - soft, nontender, nondistended, no masses or organomegaly Musculoskeletal -  See above Extremities - peripheral pulses normal, no pedal edema, no clubbing or cyanosis Skin - normal coloration and turgor, no rashes, no suspicious skin lesions noted   Asessment/Plan--- Right knee medial meniscal tear and degenerative joint changes, chondromalacia   Plan:  Right knee arthroscopy with meniscal debridement. Procedure risks and potential comps discussed with patient who elects to proceed. Goals are decreased pain and increased function withhigh likelihood of achieving both   Clindamycin due to PC allergy, Ancef sensitivity

## 2015-05-18 NOTE — Transfer of Care (Signed)
Immediate Anesthesia Transfer of Care Note  Patient: [name]  Procedure(s) Performed: Procedure(s) (LRB): KNEE ARTHROSCOPY WITH PARTIAL  MEDIAL MENISECTOMY (Right) KNEE ARTHROSCOPY WITH PARTIAL  LATERAL MENISECTOMY (Right)  Patient Location: PACU  Anesthesia Type: General  Level of Consciousness: awake, sedated, patient cooperative and responds to stimulation  Airway & Oxygen Therapy: Patient Spontanous Breathing and Patient connected to face mask oxygen  Post-op Assessment: Report given to PACU RN, Post -op Vital signs reviewed and stable and Patient moving all extremities  Post vital signs: Reviewed and stable  Complications: No apparent anesthesia complications

## 2015-05-18 NOTE — Anesthesia Preprocedure Evaluation (Addendum)
Anesthesia Evaluation  Patient identified by MRN, date of birth, ID band Patient awake    Reviewed: Allergy & Precautions, NPO status , Patient's Chart, lab work & pertinent test results  History of Anesthesia Complications (+) PONV  Airway Mallampati: II  TM Distance: >3 FB Neck ROM: Full    Dental no notable dental hx.    Pulmonary asthma , former smoker,  breath sounds clear to auscultation  Pulmonary exam normal       Cardiovascular hypertension, Pt. on medications Normal cardiovascular examRhythm:Regular Rate:Normal     Neuro/Psych negative neurological ROS  negative psych ROS   GI/Hepatic Neg liver ROS, GERD-  Medicated and Controlled,  Endo/Other  diabetes, Well Controlled, Type 2  Renal/GU negative Renal ROS  negative genitourinary   Musculoskeletal  (+) Fibromyalgia -  Abdominal   Peds negative pediatric ROS (+)  Hematology negative hematology ROS (+)   Anesthesia Other Findings   Reproductive/Obstetrics negative OB ROS                            Anesthesia Physical Anesthesia Plan  ASA: III  Anesthesia Plan: General   Post-op Pain Management:    Induction: Intravenous  Airway Management Planned: LMA  Additional Equipment:   Intra-op Plan:   Post-operative Plan: Extubation in OR  Informed Consent: I have reviewed the patients History and Physical, chart, labs and discussed the procedure including the risks, benefits and alternatives for the proposed anesthesia with the patient or authorized representative who has indicated his/her understanding and acceptance.   Dental advisory given  Plan Discussed with: CRNA  Anesthesia Plan Comments:         Anesthesia Quick Evaluation

## 2015-05-18 NOTE — Anesthesia Procedure Notes (Signed)
Procedure Name: LMA Insertion Date/Time: 05/18/2015 2:05 PM Performed by: Jessica Priest Pre-anesthesia Checklist: Patient identified, Emergency Drugs available, Suction available and Patient being monitored Patient Re-evaluated:Patient Re-evaluated prior to inductionOxygen Delivery Method: Circle System Utilized Preoxygenation: Pre-oxygenation with 100% oxygen Intubation Type: IV induction Ventilation: Mask ventilation without difficulty LMA: LMA inserted LMA Size: 4.0 Number of attempts: 1 Airway Equipment and Method: Bite block Placement Confirmation: positive ETCO2 Tube secured with: Tape Dental Injury: Teeth and Oropharynx as per pre-operative assessment

## 2015-05-19 NOTE — Op Note (Signed)
[name], [name] NO.:  [id number]  MEDICAL RECORD NO.:  [id number]  LOCATION:  PERIO                        FACILITY:  [facility]  PHYSICIAN:  Madlyn Frankel. Charlann Boxer, M.D.  DATE OF BIRTH:  08-14-1956  DATE OF PROCEDURE:  05/18/2015 DATE OF DISCHARGE:                              OPERATIVE REPORT   PREOPERATIVE DIAGNOSIS:  Right knee medial meniscal tear associated with degenerative joint changes.  POSTOPERATIVE DIAGNOSES: 1. Posterior horn midbody degenerative cleavage-type tearing to the     midportion of the medial meniscus. 2. Central tearing to the lateral meniscus midbody, posterior horn to     anterior horn extension.  PROCEDURE:  Right knee diagnostic and operative arthroscopy with medial and lateral partial meniscectomies.  FINDINGS:  Findings included the fact that the degenerative changes were diffuse, very mild, no significant chondral flaps or defects requiring any specific procedures.  SURGEON:  Madlyn Frankel. Charlann Boxer, M.D.  ASSISTANT:  Surgical team.  ANESTHESIA:  Preoperative local block at the portal sites with general LMA.  SPECIMEN:  None.  COMPLICATIONS:  None.  TOURNIQUET:  Not utilized.  BLOOD LOSS:  Minimal.  INDICATION FOR PROCEDURE:  [name] is a pleasant 59 year old female with recurring right knee symptoms that have failed to respond to conservative measures, including activity modification, medications, injections.  Given the persistence and recurrence of her symptoms, we discussed surgical intervention.  She wished at this point to proceed with arthroscopic surgery, was not prepared both clinically as well as personally to go through a knee replacement surgery.  Arthroscopic risks, limitations were discussed and reviewed as she had been through this before.  Standard risks of infection, DVT were discussed, risks of potential recurrent meniscal pathology or progressive arthritis were reviewed.  Consent was obtained.  PROCEDURE  IN DETAIL:  The patient was brought to operative theater. Once adequate anesthesia, preoperative antibiotics, clindamycin due to Ancef and penicillin allergies or sensitivities, she was positioned supine with the right leg in a leg holder.  The right lower extremity was then prepped and draped in a sterile fashion.  Time-out was performed identifying the patient, planned procedure, and extremity.  The standard inferior medial superior and medial inferior lateral portals were utilized.  Diagnostic evaluation of the knee revealed the above findings.  The inferior medial portal was utilized as a sole working portal. Initially, I used a combination of 3.5 Cuda shaver and biting basket to debride the meniscus at the midbody posterior horn junction and noted a superior leaflet tear.  There was no significant defects to the cartilage on the distal femoral or medial tibia on this surfaces.  Once I had completed the stabilization of the medial meniscus, we attended the lateral meniscus.  There was noted to be degenerative type fray tearing to the central portion of meniscus in the midbody portion, posterior to the anterior, for which, I used a straight-biting basket and debrided initially and then used a 3.5 Cuda shaver to remove meniscal fragments and contour and blend to the remaining stable level.  The knee was at this point re-examined.  I was satisfied with the overall cartilage appearance in all 3 compartments that relates to the articular cartilage surfaces.  The instrumentation was removed from the knee portal sites, reapproximated using a 4-0 nylon.  The knee was injected in this case with 0.25% Marcaine with epinephrine.  She was then brought to the recovery room in stable condition tolerating the procedure well.  Findings were reviewed with her family.     Madlyn Frankel Charlann Boxer, M.D.     MDO/MEDQ  D:  05/18/2015  T:  05/19/2015  Job:  [id number]

## 2015-05-21 ENCOUNTER — Encounter (HOSPITAL_BASED_OUTPATIENT_CLINIC_OR_DEPARTMENT_OTHER): Payer: Self-pay | Admitting: Orthopedic Surgery

## 2015-05-22 NOTE — Anesthesia Postprocedure Evaluation (Signed)
  Anesthesia Post-op Note  Patient: [name]  Procedure(s) Performed: Procedure(s): KNEE ARTHROSCOPY WITH PARTIAL  MEDIAL MENISECTOMY (Right) KNEE ARTHROSCOPY WITH PARTIAL  LATERAL MENISECTOMY (Right)  Patient Location: PACU  Anesthesia Type:General  Level of Consciousness: awake, alert  and oriented  Airway and Oxygen Therapy: Patient Spontanous Breathing  Post-op Pain: mild  Post-op Assessment: Post-op Vital signs reviewed and Patient's Cardiovascular Status Stable     RLE Motor Response: Purposeful movement, Responds to commands RLE Sensation: Full sensation      Post-op Vital Signs: Reviewed and stable  Last Vitals:  Filed Vitals:   05/18/15 1642  BP: 135/67  Pulse: 60  Temp: 36.6 C  Resp: 14    Complications: No apparent anesthesia complications

## 2015-05-25 ENCOUNTER — Encounter (HOSPITAL_BASED_OUTPATIENT_CLINIC_OR_DEPARTMENT_OTHER): Payer: Self-pay | Admitting: Orthopedic Surgery

## 2016-05-27 ENCOUNTER — Ambulatory Visit (INDEPENDENT_AMBULATORY_CARE_PROVIDER_SITE_OTHER): Payer: BLUE CROSS/BLUE SHIELD | Admitting: Podiatry

## 2016-05-27 ENCOUNTER — Encounter: Payer: Self-pay | Admitting: Podiatry

## 2016-05-27 ENCOUNTER — Ambulatory Visit (INDEPENDENT_AMBULATORY_CARE_PROVIDER_SITE_OTHER): Payer: BLUE CROSS/BLUE SHIELD

## 2016-05-27 VITALS — BP 121/76 | HR 65 | Temp 98.6°F | Resp 14 | Ht <= 58 in | Wt 188.0 lb

## 2016-05-27 DIAGNOSIS — R52 Pain, unspecified: Secondary | ICD-10-CM

## 2016-05-27 DIAGNOSIS — M722 Plantar fascial fibromatosis: Secondary | ICD-10-CM

## 2016-05-27 NOTE — Patient Instructions (Signed)
Leave bandages on right foot if possible 3-5 days and keep dry Only wear existing orthotics if you're comfortable If the heel pain/fasciitis is not improving in next 4-6 weeks return for follow-up  Plantar Fasciitis Plantar fasciitis is a painful foot condition that affects the heel. It occurs when the band of tissue that connects the toes to the heel bone (plantar fascia) becomes irritated. This can happen after exercising too much or doing other repetitive activities (overuse injury). The pain from plantar fasciitis can range from mild irritation to severe pain that makes it difficult for you to walk or move. The pain is usually worse in the morning or after you have been sitting or lying down for a while. CAUSES This condition may be caused by:  Standing for long periods of time.  Wearing shoes that do not fit.  Doing high-impact activities, including running, aerobics, and ballet.  Being overweight.  Having an abnormal way of walking (gait).  Having tight calf muscles.  Having high arches in your feet.  Starting a new athletic activity. SYMPTOMS The main symptom of this condition is heel pain. Other symptoms include:  Pain that gets worse after activity or exercise.  Pain that is worse in the morning or after resting.  Pain that goes away after you walk for a few minutes. DIAGNOSIS This condition may be diagnosed based on your signs and symptoms. Your health care provider will also do a physical exam to check for:  A tender area on the bottom of your foot.  A high arch in your foot.  Pain when you move your foot.  Difficulty moving your foot. You may also need to have imaging studies to confirm the diagnosis. These can include:  X-rays.  Ultrasound.  MRI. TREATMENT  Treatment for plantar fasciitis depends on the severity of the condition. Your treatment may include:  Rest, ice, and over-the-counter pain medicines to manage your pain.  Exercises to stretch  your calves and your plantar fascia.  A splint that holds your foot in a stretched, upward position while you sleep (night splint).  Physical therapy to relieve symptoms and prevent problems in the future.  Cortisone injections to relieve severe pain.  Extracorporeal shock wave therapy (ESWT) to stimulate damaged plantar fascia with electrical impulses. It is often used as a last resort before surgery.  Surgery, if other treatments have not worked after 12 months. HOME CARE INSTRUCTIONS  Take medicines only as directed by your health care provider.  Avoid activities that cause pain.  Roll the bottom of your foot over a bag of ice or a bottle of cold water. Do this for 20 minutes, 3-4 times a day.  Perform simple stretches as directed by your health care provider.  Try wearing athletic shoes with air-sole or gel-sole cushions or soft shoe inserts.  Wear a night splint while sleeping, if directed by your health care provider.  Keep all follow-up appointments with your health care provider. PREVENTION   Do not perform exercises or activities that cause heel pain.  Consider finding low-impact activities if you continue to have problems.  Lose weight if you need to. The best way to prevent plantar fasciitis is to avoid the activities that aggravate your plantar fascia. SEEK MEDICAL CARE IF:  Your symptoms do not go away after treatment with home care measures.  Your pain gets worse.  Your pain affects your ability to move or do your daily activities.   This information is not intended to replace  advice given to you by your health care provider. Make sure you discuss any questions you have with your health care provider.   Document Released: 06/03/2001 Document Revised: 05/30/2015 Document Reviewed: 07/19/2014 Elsevier Interactive Patient Education [employer].

## 2016-05-27 NOTE — Progress Notes (Signed)
   Subjective:    Patient ID: [name], female    DOB: 21-Apr-1956, 60 y.o.   MRN: [id number]  HPI .  This patient presents today complaining of a very painful right heel and arch for the past 3 months that has worsened gradually over time. The heel and arch pain is so intense that she is now walking and limping painful manner. The symptoms are most noticeable when she changed from a seated to a standing position. Patient has had some similar problems approximately 2006 and has a pair of existing rigid orthotics which she began wearing recently, however, the heel pain has persisted. She said the heel pain felt better with the orthotics and without. Also patient has used ice.  Review of Systems  Constitutional: Positive for fatigue.  HENT: Positive for sinus pressure.        Ringing in ears  Gastrointestinal: Positive for constipation.  Musculoskeletal: Positive for gait problem.       Joint pain  Allergic/Immunologic: Positive for environmental allergies.  All other systems reviewed and are negative.      Objective:   Physical Exam  Orientated 3  Vascular: No peripheral edema bilaterally No calf tenderness bilaterally DP and PT pulses 2/4 bilaterally Capillary reflex immediate bilaterally  Neurological: Sensation to 10 g monofilament wire intact 4/5 right 5/5 left Vibratory sensation reactive bilaterally Ankle reflex equal and reactive bilaterally  Dermatological: No open skin lesions bilaterally Texture and turgor within normal limits bilaterally  Musculoskeletal: Exquisite palpable tenderness medial central right heel in the fascial area without any palpable lesions. The fat pad is adequate Palpable tenderness mid section medial fascial band right without a palpable lesions Patient has a painful gait favoring the right foot  X-ray examination of the right foot dated 05/27/2016 weightbearing  Intact bony structure of fracture and/or dislocation Posterior and  inferior calcaneal spurs Bone density appears adequate in all views Joint spaces adequate in all views  Radiographic impression: No acute bony abnormality noted in the x-ray of the right foot dated 05/27/2016       Assessment & Plan:   Assessment: Satisfactory neurovascular status Plantar fasciitis right  Plan: I reviewed the results exam an x-ray with patient today and informed her that she is plantar fasciitis. I offered her Kenalog injection she verbally consents.  Skin is prepped with alcohol and Betadine and 10 mg of Kenalog mixed with 10 mg of plain Xylocaine and 2.5 mg of plain Sensorcaine were injected inferior heel right for Kenalog injection #1. Patient tolerated procedure without any difficulty.  Fascial taping applied right with instructions to leave on 3-5 days Shoeing and stretching discussed Will return to wearing existing orthotic if tolerated  Reappoint if symptoms are not improving 4-6 weeks

## 2016-07-16 ENCOUNTER — Ambulatory Visit (INDEPENDENT_AMBULATORY_CARE_PROVIDER_SITE_OTHER): Payer: BLUE CROSS/BLUE SHIELD | Admitting: Podiatry

## 2016-07-16 ENCOUNTER — Encounter: Payer: Self-pay | Admitting: Podiatry

## 2016-07-16 VITALS — BP 115/69 | HR 86 | Resp 18

## 2016-07-16 DIAGNOSIS — M722 Plantar fascial fibromatosis: Secondary | ICD-10-CM

## 2016-07-16 NOTE — Progress Notes (Signed)
Patient ID: Virginia BihariVickie W Sumida, female   DOB: February 23, 1956, 60 y.o.   MRN: [id number]  Subjective: This patient presents again complaining of a very painful inferior right heel especially FirstStep in the morning which change from seated to standing position and with prolonged walking and standing. On the visit of 05/27/2016 patient was given Kenalog injection which he said reduces symptoms significantly for approximately 2 days with gradual increasing in the painful right heel almost to the pretreatment level. Patient has existing heart orthotic which he is not able to tolerate at this time. When she had the angina which was applied on that visit. Also provide some additional relief  Objective: Orientated 3  Vascular: No peripheral edema bilaterally No calf tenderness bilaterally DP and PT pulses 2/4 bilaterally Capillary reflex immediate bilaterally  Neurological: Sensation to 10 g monofilament wire intact 4/5 right 5/5 left Vibratory sensation reactive bilaterally Ankle reflex equal and reactive bilaterally  Dermatological: No open skin lesions bilaterally Texture and turgor within normal limits bilaterally  Musculoskeletal: Exquisite palpable tenderness medial central right heel in the fascial area without any palpable lesions. The fat pad is adequate Palpable tenderness mid section medial fascial band right without a palpable lesions Patient has a painful gait favoring the right foot  X-ray examination of the right foot dated 05/27/2016 weightbearing  Intact bony structure of fracture and/or dislocation Posterior and inferior calcaneal spurs Bone density appears adequate in all views Joint spaces adequate in all views  Radiographic impression: No acute bony abnormality noted in the x-ray of the right foot dated 05/27/2016  Assessment: Satisfactory neurovascular status Plantar fasciitis right  Plan: I reviewed the results exam an x-ray with patient today and informed  her that she is plantar fasciitis. I offered her Kenalog injection she verbally consents.  Skin is prepped with alcohol and Betadine and 10 mg of Kenalog mixed with 10 mg of plain Xylocaine and 2.5 mg of plain Sensorcaine were injected inferior heel right for Kenalog injection #2. Patient tolerated procedure without any difficulty.  Dispensed fasciitis strap to wear and right foot and approximately week attempt to wear heart orthotic as well as fasciitis strap  Reappoint at patient's request   Discussed stretching in detail

## 2016-07-16 NOTE — Patient Instructions (Addendum)
Wear the fasciitis strap on the right foot an ongoing continuous daily basis In about a week try to resume wearing your original heart orthotic and wear the hard orthotic and wear if comfortable At the same time when wearing the heart orthotic continue wearing the fasciitis strap Remember before standing toes toward your nose Lean against the wall with your knee straight and shoes on stretch the right calf muscle daily Okay to try additional stretching exercise described below   Plantar Fasciitis With Rehab The plantar fascia is a fibrous, ligament-like, soft-tissue structure that spans the bottom of the foot. Plantar fasciitis, also called heel spur syndrome, is a condition that causes pain in the foot due to inflammation of the tissue. SYMPTOMS   Pain and tenderness on the underneath side of the foot.  Pain that worsens with standing or walking. CAUSES  Plantar fasciitis is caused by irritation and injury to the plantar fascia on the underneath side of the foot. Common mechanisms of injury include:  Direct trauma to bottom of the foot.  Damage to a small nerve that runs under the foot where the main fascia attaches to the heel bone.  Stress placed on the plantar fascia due to bone spurs. RISK INCREASES WITH:   Activities that place stress on the plantar fascia (running, jumping, pivoting, or cutting).  Poor strength and flexibility.  Improperly fitted shoes.  Tight calf muscles.  Flat feet.  Failure to warm-up properly before activity.  Obesity. PREVENTION  Warm up and stretch properly before activity.  Allow for adequate recovery between workouts.  Maintain physical fitness:  Strength, flexibility, and endurance.  Cardiovascular fitness.  Maintain a health body weight.  Avoid stress on the plantar fascia.  Wear properly fitted shoes, including arch supports for individuals who have flat feet. PROGNOSIS  If treated properly, then the symptoms of plantar  fasciitis usually resolve without surgery. However, occasionally surgery is necessary. RELATED COMPLICATIONS   Recurrent symptoms that may result in a chronic condition.  Problems of the lower back that are caused by compensating for the injury, such as limping.  Pain or weakness of the foot during push-off following surgery.  Chronic inflammation, scarring, and partial or complete fascia tear, occurring more often from repeated injections. TREATMENT  Treatment initially involves the use of ice and medication to help reduce pain and inflammation. The use of strengthening and stretching exercises may help reduce pain with activity, especially stretches of the Achilles tendon. These exercises may be performed at home or with a therapist. Your caregiver may recommend that you use heel cups of arch supports to help reduce stress on the plantar fascia. Occasionally, corticosteroid injections are given to reduce inflammation. If symptoms persist for greater than 6 months despite non-surgical (conservative), then surgery may be recommended.  MEDICATION   If pain medication is necessary, then nonsteroidal anti-inflammatory medications, such as aspirin and ibuprofen, or other minor pain relievers, such as acetaminophen, are often recommended.  Do not take pain medication within 7 days before surgery.  Prescription pain relievers may be given if deemed necessary by your caregiver. Use only as directed and only as much as you need.  Corticosteroid injections may be given by your caregiver. These injections should be reserved for the most serious cases, because they may only be given a certain number of times. HEAT AND COLD  Cold treatment (icing) relieves pain and reduces inflammation. Cold treatment should be applied for 10 to 15 minutes every 2 to 3 hours for inflammation  and pain and immediately after any activity that aggravates your symptoms. Use ice packs or massage the area with a piece of ice  (ice massage).  Heat treatment may be used prior to performing the stretching and strengthening activities prescribed by your caregiver, physical therapist, or athletic trainer. Use a heat pack or soak the injury in warm water. SEEK IMMEDIATE MEDICAL CARE IF:  Treatment seems to offer no benefit, or the condition worsens.  Any medications produce adverse side effects. EXERCISES RANGE OF MOTION (ROM) AND STRETCHING EXERCISES - Plantar Fasciitis (Heel Spur Syndrome) These exercises may help you when beginning to rehabilitate your injury. Your symptoms may resolve with or without further involvement from your physician, physical therapist or athletic trainer. While completing these exercises, remember:   Restoring tissue flexibility helps normal motion to return to the joints. This allows healthier, less painful movement and activity.  An effective stretch should be held for at least 30 seconds.  A stretch should never be painful. You should only feel a gentle lengthening or release in the stretched tissue. RANGE OF MOTION - Toe Extension, Flexion  Sit with your right / left leg crossed over your opposite knee.  Grasp your toes and gently pull them back toward the top of your foot. You should feel a stretch on the bottom of your toes and/or foot.  Hold this stretch for __________ seconds.  Now, gently pull your toes toward the bottom of your foot. You should feel a stretch on the top of your toes and or foot.  Hold this stretch for __________ seconds. Repeat __________ times. Complete this stretch __________ times per day.  RANGE OF MOTION - Ankle Dorsiflexion, Active Assisted  Remove shoes and sit on a chair that is preferably not on a carpeted surface.  Place right / left foot under knee. Extend your opposite leg for support.  Keeping your heel down, slide your right / left foot back toward the chair until you feel a stretch at your ankle or calf. If you do not feel a stretch, slide  your bottom forward to the edge of the chair, while still keeping your heel down.  Hold this stretch for __________ seconds. Repeat __________ times. Complete this stretch __________ times per day.  STRETCH - Gastroc, Standing  Place hands on wall.  Extend right / left leg, keeping the front knee somewhat bent.  Slightly point your toes inward on your back foot.  Keeping your right / left heel on the floor and your knee straight, shift your weight toward the wall, not allowing your back to arch.  You should feel a gentle stretch in the right / left calf. Hold this position for _with shoes on _________ seconds. Repeat __________ times. Complete this stretch __________ times per day. STRETCH - Soleus, Standing  Place hands on wall. With your shoes on  Extend right / left leg, keeping the other knee somewhat bent.  Slightly point your toes inward on your back foot.  Keep your right / left heel on the floor, bend your back knee, and slightly shift your weight over the back leg so that you feel a gentle stretch deep in your back calf.  Hold this position for __________ seconds. Repeat __________ times. Complete this stretch __________ times per day. STRETCH - Gastrocsoleus, Standing  Note: This exercise can place a lot of stress on your foot and ankle. Please complete this exercise only if specifically instructed by your caregiver.   Place the ball of your  right / left foot on a step, keeping your other foot firmly on the same step.  Hold on to the wall or a rail for balance.  Slowly lift your other foot, allowing your body weight to press your heel down over the edge of the step.  You should feel a stretch in your right / left calf.  Hold this position for __________ seconds.  Repeat this exercise with a slight bend in your right / left knee. Repeat __________ times. Complete this stretch __________ times per day.  STRENGTHENING EXERCISES - Plantar Fasciitis (Heel Spur  Syndrome)  These exercises may help you when beginning to rehabilitate your injury. They may resolve your symptoms with or without further involvement from your physician, physical therapist or athletic trainer. While completing these exercises, remember:   Muscles can gain both the endurance and the strength needed for everyday activities through controlled exercises.  Complete these exercises as instructed by your physician, physical therapist or athletic trainer. Progress the resistance and repetitions only as guided. STRENGTH - Towel Curls  Sit in a chair positioned on a non-carpeted surface.  Place your foot on a towel, keeping your heel on the floor.  Pull the towel toward your heel by only curling your toes. Keep your heel on the floor.  If instructed by your physician, physical therapist or athletic trainer, add ____________________ at the end of the towel. Repeat __________ times. Complete this exercise __________ times per day. STRENGTH - Ankle Inversion  Secure one end of a rubber exercise band/tubing to a fixed object (table, pole). Loop the other end around your foot just before your toes.  Place your fists between your knees. This will focus your strengthening at your ankle.  Slowly, pull your big toe up and in, making sure the band/tubing is positioned to resist the entire motion.  Hold this position for __________ seconds.  Have your muscles resist the band/tubing as it slowly pulls your foot back to the starting position. Repeat __________ times. Complete this exercises __________ times per day.    This information is not intended to replace advice given to you by your health care provider. Make sure you discuss any questions you have with your health care provider.   Document Released: 09/08/2005 Document Revised: 01/23/2015 Document Reviewed: 12/21/2008 Elsevier Interactive Patient Education [employer]2016 [employer] Inc.

## 2016-07-26 DIAGNOSIS — M7062 Trochanteric bursitis, left hip: Secondary | ICD-10-CM

## 2016-07-26 DIAGNOSIS — M7061 Trochanteric bursitis, right hip: Secondary | ICD-10-CM | POA: Insufficient documentation

## 2016-07-26 DIAGNOSIS — R5382 Chronic fatigue, unspecified: Secondary | ICD-10-CM | POA: Insufficient documentation

## 2016-07-26 DIAGNOSIS — M797 Fibromyalgia: Secondary | ICD-10-CM | POA: Insufficient documentation

## 2016-07-26 DIAGNOSIS — G4709 Other insomnia: Secondary | ICD-10-CM | POA: Insufficient documentation

## 2016-07-29 ENCOUNTER — Encounter: Payer: Self-pay | Admitting: Rheumatology

## 2016-07-29 ENCOUNTER — Ambulatory Visit (INDEPENDENT_AMBULATORY_CARE_PROVIDER_SITE_OTHER): Payer: BLUE CROSS/BLUE SHIELD | Admitting: Rheumatology

## 2016-07-29 ENCOUNTER — Ambulatory Visit: Payer: BLUE CROSS/BLUE SHIELD | Admitting: Rheumatology

## 2016-07-29 VITALS — BP 137/79 | HR 63 | Resp 13 | Ht 66.0 in | Wt 194.0 lb

## 2016-07-29 DIAGNOSIS — M170 Bilateral primary osteoarthritis of knee: Secondary | ICD-10-CM

## 2016-07-29 DIAGNOSIS — R5383 Other fatigue: Secondary | ICD-10-CM

## 2016-07-29 DIAGNOSIS — G4700 Insomnia, unspecified: Secondary | ICD-10-CM | POA: Diagnosis not present

## 2016-07-29 DIAGNOSIS — M797 Fibromyalgia: Secondary | ICD-10-CM

## 2016-07-29 DIAGNOSIS — M47816 Spondylosis without myelopathy or radiculopathy, lumbar region: Secondary | ICD-10-CM

## 2016-07-29 DIAGNOSIS — M25552 Pain in left hip: Secondary | ICD-10-CM | POA: Diagnosis not present

## 2016-07-29 DIAGNOSIS — M542 Cervicalgia: Secondary | ICD-10-CM | POA: Diagnosis not present

## 2016-07-29 DIAGNOSIS — M461 Sacroiliitis, not elsewhere classified: Secondary | ICD-10-CM

## 2016-07-29 DIAGNOSIS — M62838 Other muscle spasm: Secondary | ICD-10-CM

## 2016-07-29 MED ORDER — TRAZODONE HCL 50 MG PO TABS: 50.0000 mg | ORAL_TABLET | Freq: Every evening | ORAL | 1 refills | Status: DC | PRN

## 2016-07-29 NOTE — Progress Notes (Deleted)
*IMAGE* Office Visit Note  Patient: [name]             Date of Birth: 21-Apr-1956           MRN: [id number]             PCP: Maryelizabeth RowanEWEY,ELIZABETH, MD Referring: Lewis Moccasinewey, Elizabeth R, MD Visit Date: 07/29/2016 Occupation:@GUAROCC @    Subjective:  Pain of the Neck and Follow-up   History of Present Illness: [name] is a 60 y.o. female ***   Activities of Daily Living:  Patient reports morning stiffness for *** {minute/hour:19697}.   Patient {ACTIONS;DENIES/REPORTS:21021675::"Denies"} nocturnal pain.  Difficulty dressing/grooming: {ACTIONS;DENIES/REPORTS:21021675::"Denies"} Difficulty climbing stairs: {ACTIONS;DENIES/REPORTS:21021675::"Denies"} Difficulty getting out of chair: {ACTIONS;DENIES/REPORTS:21021675::"Denies"} Difficulty using hands for taps, buttons, cutlery, and/or writing: {ACTIONS;DENIES/REPORTS:21021675::"Denies"}   No Rheumatology ROS completed.   PMFS History:  Patient Active Problem List   Diagnosis Date Noted  . Fibromyalgia 07/26/2016  . Greater trochanteric bursitis of both hips 07/26/2016  . Chronic fatigue 07/26/2016  . Other insomnia 07/26/2016  . Rectal bleeding 01/31/2014    Past Medical History:  Diagnosis Date  . Arthritis    RIGHT KNEE  . Asthma   . Chronic back pain   . DDD (degenerative disc disease), lumbar   . Diabetes mellitus type 2, diet-controlled (HCC)   . Fibromyalgia   . GERD (gastroesophageal reflux disease)   . Hemorrhoids    INTERNAL AND EXTERNAL  . History of colon polyps    2009-- BENIGN  . Hyperlipidemia   . Hypertension   . PONV (postoperative nausea and vomiting)   . Right knee meniscal tear     Family History  Problem Relation Age of Onset  . Heart disease Mother    Past Surgical History:  Procedure Laterality Date  . CARDIOVASCULAR STRESS TEST  07-06-2013   no ischemia or infarct/  normal LV function and wall motion , ef 63%  . COLONOSCOPY W/ POLYPECTOMY  01-10-2008  . KNEE ARTHROSCOPY Right  10-14-2006  . KNEE ARTHROSCOPY WITH LATERAL MENISECTOMY Right 05/18/2015   Procedure: KNEE ARTHROSCOPY WITH PARTIAL  LATERAL MENISECTOMY;  Surgeon: Durene RomansMatthew Olin, MD;  [facility];  Service: Orthopedics;  Laterality: Right;  . KNEE ARTHROSCOPY WITH MEDIAL MENISECTOMY Right 05/18/2015   Procedure: KNEE ARTHROSCOPY WITH PARTIAL  MEDIAL MENISECTOMY;  Surgeon: Durene RomansMatthew Olin, MD;  [facility] [place];  Service: Orthopedics;  Laterality: Right;  . PULLEY RELEASE RIGHT LONG AND SMALL FINGERS  01-30-2006  . RECONSTRUCTION OF ELBOW Bilateral right 06-03-2005  &  left  06-12-2011  . ROBOTIC ASSITED PARTIAL NEPHRECTOMY Left 10-16-2008   oncocytoma ( negative neoplasm)  . SHOULDER ARTHROSCOPY W/ SUBACROMIAL DECOMPRESSION AND DISTAL CLAVICLE EXCISION Left 07-07-2007   and ROTATOR CUFF REPAIR  . SIGMOIDOSCOPY  02-02-2014  . TOTAL ABDOMINAL HYSTERECTOMY W/ BILATERAL SALPINGOOPHORECTOMY  1979   Social History   Social History Narrative  . No narrative on file     Objective: Vital Signs: BP 137/79 (BP Location: Right Arm, Patient Position: Sitting, Cuff Size: Small)   Pulse 63   Resp 13   Ht 5\' 6"  (1.676 m)   Wt 194 lb (88 kg)   BMI 31.31 kg/m    Physical Exam   Musculoskeletal Exam: ***  CDAI Exam: No CDAI exam completed.    Investigation: No additional findings.   Imaging: No results found.  Speciality Comments: No specialty comments available.    Procedures:  No procedures performed Allergies: Seasonal ic [cholestatin]; Flexeril [cyclobenzaprine]; Penicillins; Percocet [oxycodone-acetaminophen];  and Zanaflex [tizanidine hcl]   Assessment / Plan: Visit Diagnoses: No diagnosis found.    Orders: No orders of the defined types were placed in this encounter.  No orders of the defined types were placed in this encounter.   Face-to-face time spent with patient was *** minutes. 50% of time was spent in counseling and coordination of  care.  Follow-Up Instructions: No Follow-up on file.

## 2016-07-29 NOTE — Progress Notes (Signed)
*IMAGE* Office Visit Note  Patient: Virginia BihariVickie W Hosein             Date of Birth: January 05, 1956           MRN: [id number]             PCP: Maryelizabeth RowanEWEY,ELIZABETH, MD Referring: Lewis Moccasinewey, Elizabeth R, MD Visit Date: 07/29/2016 Occupation:@GUAROCC @    Subjective:  Pain of the Neck and Follow-up Fibromyalgia.  History of Present Illness: [name] is a 60 y.o. female was last seen in 12/07/2015 for fibromyalgia.   Today she states that her fibromyalgia discomfort is rated 3 on a scale of 0-10. Fatigue is rated 8 on a scale of 0-10 Insomnia is doing well with the help of trazodone but she still struggles with insomnia. She is complaining of bilateral trapezius muscle spasm sounds again. She received an injection of cortisone with lidocaine the last time she was here which helped tremendously. However she was gardening in her garden the other day and she flared up once again and is she is requesting a repeat cortisone injection to the bilateral trapezius muscle area. I am agreeable.  She is also having a left SI joint pain and is requesting an injection here possible.   Activities of Daily Living:  Patient reports morning stiffness for 15 minutes.   Patient Reports nocturnal pain.  Difficulty dressing/grooming: Reports Difficulty climbing stairs: Reports Difficulty getting out of chair: Reports Difficulty using hands for taps, buttons, cutlery, and/or writing: Reports   Review of Systems  Constitutional: Positive for fatigue.  HENT: Negative for mouth sores and mouth dryness.   Eyes: Negative for dryness.  Respiratory: Negative for shortness of breath.   Gastrointestinal: Negative for constipation and diarrhea.  Musculoskeletal: Positive for myalgias and myalgias.  Skin: Negative for sensitivity to sunlight.  Psychiatric/Behavioral: Positive for sleep disturbance. Negative for decreased concentration.    PMFS History:  Patient Active Problem List   Diagnosis Date Noted  . Fibromyalgia  07/26/2016  . Greater trochanteric bursitis of both hips 07/26/2016  . Chronic fatigue 07/26/2016  . Other insomnia 07/26/2016  . Rectal bleeding 01/31/2014    Past Medical History:  Diagnosis Date  . Arthritis    RIGHT KNEE  . Asthma   . Chronic back pain   . DDD (degenerative disc disease), lumbar   . Diabetes mellitus type 2, diet-controlled (HCC)   . Fibromyalgia   . GERD (gastroesophageal reflux disease)   . Hemorrhoids    INTERNAL AND EXTERNAL  . History of colon polyps    2009-- BENIGN  . Hyperlipidemia   . Hypertension   . PONV (postoperative nausea and vomiting)   . Right knee meniscal tear     Family History  Problem Relation Age of Onset  . Heart disease Mother    Past Surgical History:  Procedure Laterality Date  . CARDIOVASCULAR STRESS TEST  07-06-2013   no ischemia or infarct/  normal LV function and wall motion , ef 63%  . COLONOSCOPY W/ POLYPECTOMY  01-10-2008  . KNEE ARTHROSCOPY Right 10-14-2006  . KNEE ARTHROSCOPY WITH LATERAL MENISECTOMY Right 05/18/2015   Procedure: KNEE ARTHROSCOPY WITH PARTIAL  LATERAL MENISECTOMY;  Surgeon: Durene RomansMatthew Olin, MD;  [facility] [place];  Service: Orthopedics;  Laterality: Right;  . KNEE ARTHROSCOPY WITH MEDIAL MENISECTOMY Right 05/18/2015   Procedure: KNEE ARTHROSCOPY WITH PARTIAL  MEDIAL MENISECTOMY;  Surgeon: Durene RomansMatthew Olin, MD;  [facility] [place];  Service: Orthopedics;  Laterality: Right;  . PULLEY RELEASE  RIGHT LONG AND SMALL FINGERS  01-30-2006  . RECONSTRUCTION OF ELBOW Bilateral right 06-03-2005  &  left  06-12-2011  . ROBOTIC ASSITED PARTIAL NEPHRECTOMY Left 10-16-2008   oncocytoma ( negative neoplasm)  . SHOULDER ARTHROSCOPY W/ SUBACROMIAL DECOMPRESSION AND DISTAL CLAVICLE EXCISION Left 07-07-2007   and ROTATOR CUFF REPAIR  . SIGMOIDOSCOPY  02-02-2014  . TOTAL ABDOMINAL HYSTERECTOMY W/ BILATERAL SALPINGOOPHORECTOMY  1979   Social History   Social History Narrative  . No  narrative on file     Objective: Vital Signs: BP 137/79 (BP Location: Right Arm, Patient Position: Sitting, Cuff Size: Small)   Pulse 63   Resp 13   Ht 5\' 6"  (1.676 m)   Wt 194 lb (88 kg)   BMI 31.31 kg/m    Physical Exam  Constitutional: She is oriented to person, place, and time. She appears well-developed and well-nourished.  HENT:  Head: Normocephalic and atraumatic.  Eyes: EOM are normal. Pupils are equal, round, and reactive to light.  Cardiovascular: Normal rate, regular rhythm and normal heart sounds.  Exam reveals no gallop and no friction rub.   No murmur heard. Pulmonary/Chest: Effort normal and breath sounds normal. She has no wheezes. She has no rales.  Abdominal: Soft. Bowel sounds are normal. She exhibits no distension. There is no tenderness. There is no guarding. No hernia.  Musculoskeletal: Normal range of motion. She exhibits no edema, tenderness or deformity.  Lymphadenopathy:    She has no cervical adenopathy.  Neurological: She is alert and oriented to person, place, and time. Coordination normal.  Skin: Skin is warm and dry. Capillary refill takes less than 2 seconds. No rash noted.  Psychiatric: She has a normal mood and affect. Her behavior is normal.  Nursing note and vitals reviewed.    Musculoskeletal Exam:  Full range of motion of all joints Grip strength is equal and strong bilaterally Fibromyalgia tender points are 18 out of 18 positive  CDAI Exam: No CDAI exam completed.   No synovitis  Investigation: No additional findings.   Imaging: No results found.  Speciality Comments: No specialty comments available.    Procedures:  Large Joint Inj Date/Time: 07/29/2016 9:35 AM Performed by: Tawni PummelPANWALA, Liat Mayol Authorized by: Tawni PummelPANWALA, Mykal Batiz   Consent Given by:  Patient Site marked: the procedure site was marked   Timeout: prior to procedure the correct patient, procedure, and site was verified   Indications:  Pain Location:  Hip Site:   L hip joint Prep: patient was prepped and draped in usual sterile fashion   Needle Size:  27 G Needle Length:  1.5 inches Approach:  Superior Ultrasound Guidance: No   Fluoroscopic Guidance: No   Arthrogram: No Medications:  40 mg triamcinolone acetonide 40 MG/ML; 1.5 mL lidocaine 1 % Aspiration Attempted: Yes   Aspirate amount (mL):  0 Patient tolerance:  Patient tolerated the procedure well with no immediate complications Comments: PATIENT TOLERATED PROCEDURE WELL. THERE WERE NO COMPLICATIONS.    Trigger Point Inj Date/Time: 07/29/2016 9:37 AM Performed by: Tawni PummelPANWALA, Danique Hartsough Authorized by: Tawni PummelPANWALA, Tristen Pennino   Consent Given by:  Patient Indications:  Muscle spasm and pain Total # of Trigger Points:  2 Location: neck   Needle Size:  27 G Medications #1:  0.5 mL lidocaine 1 %; 10 mg triamcinolone acetonide 40 MG/ML Medications #2:  0.5 mL lidocaine 1 %; 10 mg triamcinolone acetonide 40 MG/ML Patient tolerance:  Patient tolerated the procedure well with no immediate complications Comments: Patient tolerated procedure well. There are no  complications to the bilateral trapezius muscle injections.   Allergies: Seasonal ic [cholestatin]; Flexeril [cyclobenzaprine]; Penicillins; Percocet [oxycodone-acetaminophen]; and Zanaflex [tizanidine hcl]   Assessment / Plan: Visit Diagnoses: Fibromyalgia  Fatigue, unspecified type  Insomnia, unspecified type  Primary osteoarthritis of both knees  Osteoarthritis of lumbar spine, unspecified spinal osteoarthritis complication status - withSpinal stenosis  Sacroiliitis (HCC) - left SI JOINT pain - Plan: Large Joint Injection/Arthrocentesis  Trapezius muscle spasm - Plan: Trigger Point Injection   We offered the patient water aerobics as a good exercise option but patient declines water aerobics. She exercises by doing walking, most her lawn, going to the gym soon. She uses over-the-counter tiger balm which gives her good relief. Patient  combines over-the-counter melatonin with trazodone and gets her good relief. She has plenty of baclofen at home right now and it works well for her. She does not need a refill. We've given her Cymbalta in the past at 30 mg but patient could not tolerate 30 mg. Her PCP gave her 20 mg and she could not tolerate that either. If patient has a flare of trapezius muscle spasm, I offered her lidocaine only injections 3 months from now she needs it. She will call our office and arrange an appointment. Return to clinic in 6 months.    Orders: Orders Placed This Encounter  Procedures  . Large Joint Injection/Arthrocentesis  . Trigger Point Injection   Meds ordered this encounter  Medications  . DISCONTD: traZODone (DESYREL) 50 MG tablet    Sig: take 1 tablet by mouth at bedtime if needed    Refill:  0  . QVAR 80 MCG/ACT inhaler    Refill:  2  . traZODone (DESYREL) 50 MG tablet    Sig: Take 1 tablet (50 mg total) by mouth at bedtime as needed for sleep.    Dispense:  90 tablet    Refill:  1    Face-to-face time spent with patient was 30 minutes. 50% of time was spent in counseling and coordination of care.  Follow-Up Instructions: Return in about 6 months (around 01/26/2017) for Fibromyalga, Fatigue, Insomnia, Left SI Joint Pain, Bilateral Trapezius Muslce Spasm.   I examined and evaluated the patient with Tawni Pummel PA. The plan of care was discussed as noted above.  Pollyann Savoy, MD

## 2016-07-30 MED ORDER — LIDOCAINE HCL 1 % IJ SOLN
0.5000 mL | INTRAMUSCULAR | Status: AC | PRN
  Administered 2016-07-29: .5 mL

## 2016-07-30 MED ORDER — TRIAMCINOLONE ACETONIDE 40 MG/ML IJ SUSP
10.0000 mg | INTRAMUSCULAR | Status: AC | PRN
  Administered 2016-07-29: 10 mg via INTRAMUSCULAR

## 2016-07-30 MED ORDER — LIDOCAINE HCL 1 % IJ SOLN
1.5000 mL | INTRAMUSCULAR | Status: AC | PRN
  Administered 2016-07-29: 1.5 mL

## 2016-07-30 MED ORDER — TRIAMCINOLONE ACETONIDE 40 MG/ML IJ SUSP
40.0000 mg | INTRAMUSCULAR | Status: AC | PRN
  Administered 2016-07-29: 40 mg via INTRA_ARTICULAR

## 2016-09-02 ENCOUNTER — Encounter: Payer: Self-pay | Admitting: Podiatry

## 2016-09-02 ENCOUNTER — Other Ambulatory Visit: Payer: Self-pay | Admitting: Podiatry

## 2016-09-02 ENCOUNTER — Ambulatory Visit (INDEPENDENT_AMBULATORY_CARE_PROVIDER_SITE_OTHER): Payer: BLUE CROSS/BLUE SHIELD | Admitting: Podiatry

## 2016-09-02 ENCOUNTER — Ambulatory Visit (INDEPENDENT_AMBULATORY_CARE_PROVIDER_SITE_OTHER): Payer: BLUE CROSS/BLUE SHIELD

## 2016-09-02 VITALS — BP 144/82 | HR 81 | Resp 14

## 2016-09-02 DIAGNOSIS — R52 Pain, unspecified: Secondary | ICD-10-CM

## 2016-09-02 DIAGNOSIS — M8430XA Stress fracture, unspecified site, initial encounter for fracture: Secondary | ICD-10-CM | POA: Diagnosis not present

## 2016-09-02 DIAGNOSIS — M79673 Pain in unspecified foot: Secondary | ICD-10-CM | POA: Diagnosis not present

## 2016-09-02 NOTE — Patient Instructions (Signed)
Today your examination demonstrated a low-grade swelling on the top outside of the right foot. The x-ray of your foot and ankle today was inconclusive, however, there was a area on the cuboid bone which may be suggestive of possible stress reaction? Wear dispensing a boot for you to wear on the right foot except when driving and charring. Otherwise please wear the boot on the right foot on a continuous basis when standing and walking at all times

## 2016-09-02 NOTE — Progress Notes (Signed)
   Subjective:    Patient ID: [name], female    DOB: 05-29-1956, 60 y.o.   MRN: [id number]  HPI    This patient presents today complaining of extremely painful right foot for the past 3 weeks. She first became aware of the pain when she was sleeping at night. She describes swelling, warmth and tenderness on the dorsal aspect of the right foot. Patient has difficulty walking because of the pain. She has been using ice. He says the symptoms will reduce if she elevates her foot. She denies direct injury to the right foot or any other joint pain other than ongoing back pain. She does take a hydrocodone daily for back pain. She does have a history of plantar fasciitis which she says has significantly improved and he has not complaining about today.  Review of Systems  All other systems reviewed and are negative.      Objective:   Physical Exam  Orientated 3  Vascular: Low-grade nonpitting edema dorsal lateral aspect of right foot DP and PT pulses 2/4 bilaterally Reflex immediate bilaterally Sensation to 10 g monofilament wire intact 5/5 bilaterally Vibratory sensation reactive bilaterally Ankle reflex equal and reactive bilaterally No open skin lesions bilaterally Manual motor testing dorsi flexion, plantar flexion, inversion, eversion 5/5 bilaterally Exquisite tenderness dorsal lateral aspect of right foot, lateral anterior ankle. Also palpable tenderness plantar right insertional area and tendo Achilles right without any palpable lesions. In a seated position nurse no pain or crepitus on range of motion of ankle, subtalar, midtarsal joints bilaterally  X-ray examination of the right foot today dated 09/02/2016 There is a sclerotic area noted on the cuboid. Do not observe an obvious fracture in the cuboid. Posterior and inferior calcaneal spurs Radiographic impression: Cannot rule out stress fracture cuboid  X-ray examination right ankle dated December Intact bony  structure of fracture or dislocation  Ankle mortise within normal limits Radiographic impression: No acute bony abnormality noted in the right ankle       Assessment & Plan:   Assessment: Today I reviewed the results of the exam and x-ray with patient today. I informed her that I was suspicious of a possible stress fracture in the cuboid versus a bone stress reaction. Also, because patient is history of plantar fasciitis recently cannot rule out stress of the lateral column associated with altered gait associate with plantar fasciitis  Plan: Dispensed pneumatic Cam Walker boot to wear in the right foot except when driving and showering and sleeping  Reevaluate 3 weeks

## 2016-09-23 ENCOUNTER — Ambulatory Visit (INDEPENDENT_AMBULATORY_CARE_PROVIDER_SITE_OTHER): Payer: BLUE CROSS/BLUE SHIELD | Admitting: Podiatry

## 2016-09-23 VITALS — BP 125/77 | HR 68 | Resp 14

## 2016-09-23 DIAGNOSIS — M8430XA Stress fracture, unspecified site, initial encounter for fracture: Secondary | ICD-10-CM | POA: Diagnosis not present

## 2016-09-23 NOTE — Patient Instructions (Signed)
Continue wearing Cam Walker boot on right foot

## 2016-09-23 NOTE — Progress Notes (Signed)
   Subjective:    Patient ID: [name], female    DOB: 28-Dec-1955, 61 y.o.   MRN: [id number]  HPI  Patient presents today for a follow-up visit from 09/02/2016. Currently patient is wearing a Cam Walker boot on the right foot and states that if she wears the boot her right foot is comfortable, however, without the boot in the lateral right foot is quite uncomfortable.  Review of Systems  All other systems reviewed and are negative.      Objective:   Physical Exam   Orientated 3  No edema dorsal lateral aspect of right foot DP and PT pulses 2/4 bilaterally Reflex immediate bilaterally Sensation to 10 g monofilament wire intact 5/5 bilaterally Vibratory sensation reactive bilaterally Ankle reflex equal and reactive bilaterally No open skin lesions bilaterally Manual motor testing dorsi flexion, plantar flexion, inversion, eversion 5/5 bilaterally Exquisite tenderness dorsal lateral aspect of right foot on the basis of the fourth fifth metatarsal and cuboid without any palpable lesions lateral anterior ankle. Also palpable tenderness plantar right insertional area and tendo Achilles right without any palpable lesions. In a seated position nurse no pain or crepitus on range of motion of ankle, subtalar, midtarsal joints bilaterally      Assessment & Plan:   Assessment: Bone stress reaction lateral right foot  Plan: This patient is symptomatically still symptomatic to direct palpation I will have patient continue limit her standing walking continue to wear Cam Walker boot. Patient also states that because of high deductible she cannot afford any imaging such as MRI or CT scanning  Reappoint 3 weeks for progress x-ray

## 2016-10-14 ENCOUNTER — Ambulatory Visit (INDEPENDENT_AMBULATORY_CARE_PROVIDER_SITE_OTHER): Payer: BLUE CROSS/BLUE SHIELD

## 2016-10-14 ENCOUNTER — Ambulatory Visit (INDEPENDENT_AMBULATORY_CARE_PROVIDER_SITE_OTHER): Payer: BLUE CROSS/BLUE SHIELD | Admitting: Podiatry

## 2016-10-14 ENCOUNTER — Encounter: Payer: Self-pay | Admitting: Podiatry

## 2016-10-14 VITALS — BP 117/73 | HR 81 | Resp 14

## 2016-10-14 DIAGNOSIS — R52 Pain, unspecified: Secondary | ICD-10-CM

## 2016-10-14 DIAGNOSIS — M8430XA Stress fracture, unspecified site, initial encounter for fracture: Secondary | ICD-10-CM

## 2016-10-14 DIAGNOSIS — M79606 Pain in leg, unspecified: Secondary | ICD-10-CM

## 2016-10-14 MED ORDER — PREDNISONE 10 MG (21) PO TBPK: ORAL_TABLET | ORAL | 0 refills | Status: DC

## 2016-10-14 NOTE — Progress Notes (Signed)
   Subjective:    Patient ID: [name], female    DOB: [birth year], 61 y.o.   MRN: [id number]  HPI Patient presents today for a follow-up visit from 09/02/2016. Currently patient is wearing a Cam Walker boot on the right foot and states that if she wears the boot her right foot is comfortable, however, without the boot in the lateral right foot is quite uncomfortable.Today patient states that her start pain was 10 /10 and now with 7/10   Review of Systems  All other systems reviewed and are negative.      Objective:   Physical Exam   Orientated 3  No edema dorsal lateral aspect of right foot DP and PT pulses 2/4 bilaterally Reflex immediate bilaterally Sensation to 10 g monofilament wire intact 5/5 bilaterally Vibratory sensation reactive bilaterally Ankle reflex equal and reactive bilaterally No open skin lesions bilaterally Manual motor testing dorsi flexion, plantar flexion, inversion, eversion 5/5 bilaterally Exquisite tenderness dorsal lateral aspect of right foot on the basis of the fourth fifth metatarsal and cuboid without any palpable lesions lateral anterior ankle. Also palpable tenderness plantar right insertional area and tendo Achilles right without any palpable lesions. In a seated position nurse no pain or crepitus on range of motion of ankle, subtalar, midtarsal joints bilaterally  Serial x-ray of right foot dated 10/14/2016 weightbearing Intact bony structures without fracture and/or dislocation No increased soft tissue density noted Radiographic impression: No acute bony abnormality noted weightbearing x-ray of the right foot dated 10/14/2016       Assessment & Plan:   Assessment: Bone stress reaction lateral right foot versus lateral column tarsalgia  Plan: I reviewed the results of exam and x-ray with patient today. Informed her there were no x-ray changes suggesting bone involvement. Patient instructed to continue wearing Cam Walker boot on  right foot except when showering and driving Rx 10 mg prednisone six-day tapering Dosepak  Reappoint 4 weeks

## 2016-10-14 NOTE — Patient Instructions (Signed)
Continue wearing ear Cam Walker boot on the right foot on an ongoing continuous basis except when driving and showering Take care prednisone 10 mg Dosepak as prescribed

## 2016-10-27 ENCOUNTER — Other Ambulatory Visit: Payer: Self-pay | Admitting: Podiatry

## 2016-10-27 DIAGNOSIS — R52 Pain, unspecified: Secondary | ICD-10-CM

## 2016-11-11 ENCOUNTER — Encounter: Payer: Self-pay | Admitting: Podiatry

## 2016-11-11 ENCOUNTER — Ambulatory Visit (INDEPENDENT_AMBULATORY_CARE_PROVIDER_SITE_OTHER): Payer: BLUE CROSS/BLUE SHIELD

## 2016-11-11 ENCOUNTER — Ambulatory Visit (INDEPENDENT_AMBULATORY_CARE_PROVIDER_SITE_OTHER): Payer: BLUE CROSS/BLUE SHIELD | Admitting: Podiatry

## 2016-11-11 VITALS — BP 134/80 | HR 76

## 2016-11-11 DIAGNOSIS — M79671 Pain in right foot: Secondary | ICD-10-CM

## 2016-11-11 DIAGNOSIS — M8430XA Stress fracture, unspecified site, initial encounter for fracture: Secondary | ICD-10-CM | POA: Diagnosis not present

## 2016-11-11 DIAGNOSIS — R52 Pain, unspecified: Secondary | ICD-10-CM | POA: Diagnosis not present

## 2016-11-11 DIAGNOSIS — M79606 Pain in leg, unspecified: Secondary | ICD-10-CM | POA: Diagnosis not present

## 2016-11-11 NOTE — Patient Instructions (Signed)
Maintain the boot on the right foot/leg Patient will contact [facility] to discuss pavement options for possible MRI of the right foot

## 2016-11-11 NOTE — Progress Notes (Signed)
Patient ID: [name], female   DOB: 1956/04/07, 61 y.o.   MRN: [id number]   Subjective:  HPI Patient presents today for a follow-up visi ongoing t from 09/02/2016. Currently patient is wearing a Cam Walker boot on the right foot and states that if she wears the boot her right foot is comfortable, however, without the boot in the lateral right foot is quite uncomfortable.Today patient states that her start pain was 10 /10 and now with 5/10. Patient still complains of dorsal right midfoot pain in spite of immobilization. Also, patient has completed prednisone prescribed in the visit of 10/14/2016  Objective:  Orientated 3  Noedema dorsal lateral aspect of right foot DP and PT pulses 2/4 bilaterally Reflex immediate bilaterally Sensation to 10 g monofilament wire intact 5/5 bilaterally Vibratory sensation reactive bilaterally Ankle reflex equal and reactive bilaterally No open skin lesions bilaterally There is no erythema, edema, warmth, right Texture and turgor within normal limits right Manual motor testing dorsi flexion, plantar flexion, inversion, eversion 5/5 bilaterally Exquisite tenderness dorsal lateral aspect of right footbases of the second and third right metatarsals without any palpable lesionslateral anterior ankle. Also palpable tenderness plantar right insertional area In a seated position nurse no pain or crepitus on range of motion of ankle, subtalar, midtarsal joints bilaterally  Serial x-ray of right foot dated every 11/11/2016 weightbearing Intact bony structures without fracture and/or dislocation No increased soft tissue density noted Bone density appears adequate Joint spaces appear adequate Posterior and inferior calcaneal spurs X-ray appears consistent with previous serial x-rays  Radiographic impression: No acute bony abnormality noted weightbearing x-ray of the right foot dated 11/11/2016 Radiographic impression: No acute bony abnormality noted  weightbearing x-ray of the right foot dated 10/14/2016  Assessment: Probable bone stress reaction Possible pain syndrome  Plan: At this time I reviewed the results of x-ray again with patient today and informed her there no x-ray changes or any acute bony abnormalities noted. Again, I wanted to order MRI image of the right foot. Patient has declined this because of the expense of the MRI. Today patient states that she would consider having MRI and she will contact the [facility] to see if she can work out a Insurance claims handlerpayment plan. If she can will order MRI image of the right foot noncontrast Patient will continue wearing Cam Walker boot on the right foot an additional 4 weeks  Reappoint 4 weeks

## 2016-12-09 ENCOUNTER — Ambulatory Visit: Payer: BLUE CROSS/BLUE SHIELD | Admitting: Podiatry

## 2016-12-10 ENCOUNTER — Encounter: Payer: Self-pay | Admitting: Podiatry

## 2016-12-10 ENCOUNTER — Ambulatory Visit (INDEPENDENT_AMBULATORY_CARE_PROVIDER_SITE_OTHER): Payer: BLUE CROSS/BLUE SHIELD | Admitting: Podiatry

## 2016-12-10 VITALS — BP 126/68 | HR 67 | Resp 18

## 2016-12-10 DIAGNOSIS — M722 Plantar fascial fibromatosis: Secondary | ICD-10-CM

## 2016-12-10 DIAGNOSIS — M779 Enthesopathy, unspecified: Secondary | ICD-10-CM

## 2016-12-10 MED ORDER — MELOXICAM 15 MG PO TABS: 15.0000 mg | ORAL_TABLET | Freq: Every day | ORAL | 0 refills | Status: DC

## 2016-12-13 NOTE — Progress Notes (Signed)
Subjective: 61 year old female presents the office they for follow-up evaluation of pain and possible stress fracture to the right foot. She states that she has been in the cam boot for 12 weeks and she needs to get out of the boot. She states that overall her pain has improved although it does continue some degree. She is also notices: Back into a regular shoe she has started to notice it is tight and there is been some mild swelling to the top of her right foot. She denies any recent injury or trauma. She is also states that her heel hurts and this is been ongoing for some time and she is asking for a plantar fascial taping today as this helps quite a bit.  Denies any systemic complaints such as fevers, chills, nausea, vomiting. No acute changes since last appointment, and no other complaints at this time.   Objective: AAO x3, NAD DP/PT pulses palpable bilaterally, CRT less than 3 seconds There is mild tenderness in the dorsal aspect of the patient's right foot along the tarsal metatarsal joint approximate the second TMT. There is no area of significant pinpoint bony tenderness there is no pain to vibratory sensation. There does appear to be some mild edema to the dorsal aspect of the foot however there is no erythema or increase in warmth. There is no pain with lateral compression of the calcaneus. There is mild discomfort upon palpation to the plantar medial tubercle of the calcaneus near the insertion of plantar fascia. There is no pain along the course of the plantar fascial within the arch of the foot. There is no other areas of tenderness of them for this time. No open lesions or pre-ulcerative lesions.  No pain with calf compression, swelling, warmth, erythema  Assessment: 61 year old female right foot pain likely capsulitis/osteoarthritis; Plantar fasciitis  Plan: -All treatment options discussed with the patient including all alternatives, risks, complications.  -she declined x-rays  today. -I discussed a steroid injection into the area of tenderness. She wishes to proceed with this. Under sterile conditions a mixture of Kenalog and local anesthetic was infiltrated without complications. Post injection care was discussed. -She decided to transition to regular shoe however discussed our support as well. -Plantar fascial taking was applied today. Her request. -Stretching, icing daily as well -Mobic prn -RTC as scheduled or sooner if needed.   Ovid CurdMatthew Artur Winningham, DPM -Patient encouraged to call the office with any questions, concerns, change in symptoms.

## 2017-01-05 ENCOUNTER — Ambulatory Visit: Payer: BLUE CROSS/BLUE SHIELD | Admitting: Podiatry

## 2017-02-09 ENCOUNTER — Telehealth: Payer: Self-pay | Admitting: *Deleted

## 2017-02-09 DIAGNOSIS — F430 Acute stress reaction: Secondary | ICD-10-CM

## 2017-02-09 DIAGNOSIS — R52 Pain, unspecified: Secondary | ICD-10-CM

## 2017-02-09 DIAGNOSIS — M79606 Pain in leg, unspecified: Secondary | ICD-10-CM

## 2017-02-09 NOTE — Telephone Encounter (Addendum)
Pt states Dr. Leeanne Deeduchman said to let him know when she wanted a MRI. Dr. Theotis Burrowuchman's LOV 11/11/2016 states pt will [facility] if she decides to have MRI, if so order MRI of right foot without contrast. [facility]. I informed pt she could call for MRI schedule.02/19/2017-DrLeeanne Deed. Tuchman ordered Exogen Bone Growth Stimulator due to review of MRI results. I informed Thurston Poundsrey - [employer] and he will pick up paperwork today.

## 2017-02-10 ENCOUNTER — Telehealth: Payer: Self-pay | Admitting: *Deleted

## 2017-02-10 NOTE — Telephone Encounter (Signed)
"  We have the patient on the schedule for Friday.  Scheduled for MRI of right foot without contrast.  We need an [place] from [employer].  If you have any questions give me a call."

## 2017-02-11 NOTE — Telephone Encounter (Signed)
I called and informed Altamease Oileroelle that I got Hermina's MRI authorized.  Authorization number is [id number] and is valid from 02/11/2017 to 03/12/2017.

## 2017-02-13 ENCOUNTER — Ambulatory Visit
Admission: RE | Admit: 2017-02-13 | Discharge: 2017-02-13 | Disposition: A | Discharge status: Home / Self Care | Payer: BLUE CROSS/BLUE SHIELD | Source: Ambulatory Visit | Attending: Podiatry | Admitting: Podiatry

## 2017-02-13 ENCOUNTER — Other Ambulatory Visit: Payer: Self-pay

## 2017-02-18 ENCOUNTER — Encounter: Payer: Self-pay | Admitting: Podiatry

## 2017-02-18 ENCOUNTER — Ambulatory Visit (INDEPENDENT_AMBULATORY_CARE_PROVIDER_SITE_OTHER): Payer: BLUE CROSS/BLUE SHIELD | Admitting: Podiatry

## 2017-02-18 VITALS — BP 130/74 | HR 68 | Resp 18

## 2017-02-18 DIAGNOSIS — F430 Acute stress reaction: Secondary | ICD-10-CM | POA: Diagnosis not present

## 2017-02-18 NOTE — Patient Instructions (Signed)
Continue to wear the boot on right foot on a regular basis 6 weeks Okay to apply tape dressing as we discussed over right midfoot Compounding pharmacy [facility] you medication for general pain in nerve-like discomfort to rule out anterior foot as needed 3-4 times a day Reevaluate 6-a week

## 2017-02-18 NOTE — Progress Notes (Signed)
   Subjective:    Patient ID: [name], female    DOB: December 12, 1955, 61 y.o.   MRN: [id number]  HPI   I am here to get my results of my MRI  This patient presents today to review the results of MRI image dated 02/13/2017 at this time patient has ongoing persistent right foot pain aggravated with standing walking reduced somewhat with rest and elevation. The symptoms have been ongoing since December 2017. Patient has worn a Cam Walker boot has had oral prednisone as well as a local steroid injection without any relief of symptoms. Patient describes a generalized pain on the dorsal aspect somewhat laterally located and since they local steroid injection she also complains of some numbness and tingling patient takes ongoing hydrocodone 5 mg twice a day for ongoing back pain which is not relieved patient's foot pain. 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  All other systems reviewed and are negative.      Objective:   Physical Exam   Noedema dorsal lateral aspect of right foot DP and PT pulses 2/4 bilaterally Reflex immediate bilaterally Sensation to 10 g monofilament wire intact 5/5 bilaterally Vibratory sensation reactive bilaterally Ankle reflex equal and reactive bilaterally No open skin lesions bilaterally There is no erythema, edema, warmth, right Texture and turgor within normal limits right Manual motor testing dorsi flexion, plantar flexion, inversion, eversion 5/5 bilaterally Palpable tenderness medial navicular, dorsal midfoot and lateral foot right  MRI image dated 02/13/2017 Impressions: No evidence of stress reaction or other acute findings in the forefoot There is marrow edema within the navicular with linear subchondral low T2 signal which could indicate  a stress fracture Mild midfoot degenerative changes without subluxation in the Lisfranc's joint. The Lisfranc's ligament is intact Mild nonspecific dorsal lateral subcutaneous edema           Assessment & Plan:     Assessment: Bone stress reaction with possible stress fracture navicular right foot  Plan: Today review the results of the exam and MRI with patient. At this time I instructed patient to resume wearing the Cam Walker boot Demonstrated 2 inch sports tape over prewrap to support the right midfoot Patient takes ongoing hydrocodone for back pain  Rx compounding formula combination pain cream Baclofen 2%/doxepin 5%/gabapentin 6%/Topiramate 2%/pentoxifylline 3% Dispense 120 g Apply 1-2 g to the affected area 3-4 times daily  Reevaluate 8 weeks

## 2017-04-15 ENCOUNTER — Encounter: Payer: Self-pay | Admitting: Podiatry

## 2017-04-15 ENCOUNTER — Ambulatory Visit (INDEPENDENT_AMBULATORY_CARE_PROVIDER_SITE_OTHER): Payer: BLUE CROSS/BLUE SHIELD | Admitting: Podiatry

## 2017-04-15 VITALS — BP 130/82

## 2017-04-15 DIAGNOSIS — M8430XA Stress fracture, unspecified site, initial encounter for fracture: Secondary | ICD-10-CM | POA: Diagnosis not present

## 2017-04-15 NOTE — Patient Instructions (Signed)
Wear the surgical shoe or boot as needed to control right foot pain Like to reschedule with Dr. Logan BoresEvans for a second opinion

## 2017-04-15 NOTE — Progress Notes (Signed)
   Subjective:    Patient ID: [name], female    DOB: 29-Nov-1955, 61 y.o.   MRN: [id number]  HPI This patient presents today again complaining of persistent pain in the right foot. The pain occurs and increases with standing walking and is relieved with rest and elevation. Also, patient had significant increase of swelling in the right foot in the past several days which has reduced. Patient states that if she wears a Cam Walker boot when standing walking the pain is relieved, however, when she stands or walks for any period of time or initial change from seated to standing position the right foot pain reoccurs and less she wears a protective boot.   The symptoms have been ongoing since December 2017. Patient has worn a Cam Walker boot has had oral prednisone as well as a local steroid injection ([name]) without any relief of symptoms. Patient describes a generalized pain on the dorsal aspect somewhat laterally located and since they local steroid injection she also complains of some numbness and tingling patient takes ongoing hydrocodone 5 mg twice a day for ongoing back pain which is not relieved patient's foot pain. Also, patient says that the compounding pain cream prescribed in the visit of 02/18/2017 was of no help. Also, had try to obtain a charge precertification for bone stimulator which was declined by the insurance company  The patient's husband is present in the treatment room       Review of Systems  Skin:       I have some swelling on my right foot        Objective:   Physical Exam  BP 1:30/82  Orientated 3  Vascular: Bilateral peripheral pitting edema bilaterally DP PT pulses 2/4 bilaterally Capillary reflex immediate bilaterally No calf pain or calf edema or calf tenderness bilaterally  Neurological: Sensation to 10 g monofilament wire intact bilaterally Vibratory sensation reactive bilaterally Ankle reflex reactive  bilaterally  Dermatological: No open skin lesions bilaterally Texture and turgor within normal limits bilaterally  Musculoskeletal: Palpable tenderness medial right midfoot without any palpable lesions Palpable tenderness lateral midfoot without any palpable lesions    MRI image dated 02/13/2017 Impressions: No evidence of stress reaction or other acute findings in the forefoot There is marrow edema within the navicular with linear subchondral low T2 signal which could indicate a stress fracture Mild midfoot degenerative changes without subluxation in the Lisfranc's joint. The Lisfranc's ligament is intact Mild nonspecific dorsal lateral subcutaneous edema      Assessment & Plan:    Assessment: Non-resolve chronic bone stress reaction or chronic pain cycle. The symptoms have not responded to prolonged protection with Cam Walker boot, oral and injectable corticosteroid as well as topical pain relief cream  Plan: Dispensed surgical shoe to wear as needed At this time I informed patient of patient's husband that I could not give him a specific reason for this ongoing pain cycle. I am suspicious of a possible stress fracture as indicated by MRI image. I'm referring this patient to Dr. Gala LewandowskyBrent [name] for further evaluation, consultation and any new she agreed upon treatment  Reappoint for follow-up with Dr. Gala LewandowskyBrent [name]

## 2017-04-27 ENCOUNTER — Ambulatory Visit (INDEPENDENT_AMBULATORY_CARE_PROVIDER_SITE_OTHER): Payer: BLUE CROSS/BLUE SHIELD | Admitting: Podiatry

## 2017-04-27 ENCOUNTER — Encounter: Payer: Self-pay | Admitting: Podiatry

## 2017-04-27 DIAGNOSIS — R600 Localized edema: Secondary | ICD-10-CM

## 2017-04-27 DIAGNOSIS — M79671 Pain in right foot: Secondary | ICD-10-CM

## 2017-04-27 DIAGNOSIS — G8929 Other chronic pain: Secondary | ICD-10-CM | POA: Diagnosis not present

## 2017-04-27 MED ORDER — METHYLPREDNISOLONE 4 MG PO TBPK: ORAL_TABLET | ORAL | 0 refills | Status: DC

## 2017-04-28 NOTE — Progress Notes (Signed)
   HPI: Patient presents today for evaluation of right foot pain has been going on for approximately 1 year now. Patient denies a history of trauma. Patient states that by the end of the day she gets significant amount of swelling and tenderness. She states that the foot hurts throughout the entire foot and ankle. Patient had an MRI performed in May 2018 which was negative for any significant findings. Patient presents today for further treatment and evaluation   Physical Exam: General: The patient is alert and oriented x3 in no acute distress.  Dermatology: Skin is warm, dry and supple bilateral lower extremities. Negative for open lesions or macerations.  Vascular: Palpable pedal pulses bilaterally. No erythema noted. Capillary refill within normal limits.  Neurological: Epicritic and protective threshold grossly intact bilaterally.   Musculoskeletal Exam: Significant pain on palpation noted throughout multiple areas of the right lower extremity including the peroneal tendon sheath, navicular tuberosity, plantar fascia, base of the fourth metatarsal. There is some minimal edema noted to the right foot diffusely Range of motion within normal limits to all pedal and ankle joints bilateral. Muscle strength 5/5 in all groups bilateral.   Assessment: 1. Diffuse right foot pain with edema   Plan of Care:  1. Patient was evaluated. 2. Prescription for Medrol Dosepak 3. Prescription for diclofenac 75 mg 4. Today a multilayer Unna boot compression wrap was applied to the right lower extremity 5. Recommend wearing good supportive shoe gear 6. Return to clinic in 1 week   Felecia ShellingBrent M. Coley Kulikowski, DPM [facility]  Dr. Felecia ShellingBrent M. Shemeka Wardle, DPM    [address]                                        [place], [place] [address]                Office [phone]  Fax [phone]

## 2017-05-04 ENCOUNTER — Ambulatory Visit (INDEPENDENT_AMBULATORY_CARE_PROVIDER_SITE_OTHER): Payer: BLUE CROSS/BLUE SHIELD | Admitting: Podiatry

## 2017-05-04 DIAGNOSIS — M79671 Pain in right foot: Secondary | ICD-10-CM

## 2017-05-04 DIAGNOSIS — G8929 Other chronic pain: Secondary | ICD-10-CM | POA: Diagnosis not present

## 2017-05-04 DIAGNOSIS — R600 Localized edema: Secondary | ICD-10-CM

## 2017-05-04 DIAGNOSIS — M7671 Peroneal tendinitis, right leg: Secondary | ICD-10-CM

## 2017-05-04 MED ORDER — DICLOFENAC SODIUM 75 MG PO TBEC: 75.0000 mg | DELAYED_RELEASE_TABLET | Freq: Two times a day (BID) | ORAL | 0 refills | Status: DC

## 2017-05-04 MED ORDER — BETAMETHASONE SOD PHOS & ACET 6 (3-3) MG/ML IJ SUSP: 3.0000 mg | Freq: Once | INTRAMUSCULAR | Status: AC

## 2017-05-04 NOTE — Progress Notes (Signed)
   HPI: Patient presents today for follow-up treatment and evaluation regarding multiple areas of pain and chronic pain to the right foot. Patient has been wearing the multilayer Unna boot compression wrap for the past week. She states that the wrap definitely helped to get rid of a lot of the swelling and chronic edema. Patient also completed her Medrol Dosepak. She notices some improvement of pain.   Physical Exam: General: The patient is alert and oriented x3 in no acute distress.  Dermatology: Skin is warm, dry and supple bilateral lower extremities. Negative for open lesions or macerations.  Vascular: Palpable pedal pulses bilaterally. No edema or erythema noted. Capillary refill within normal limits.  Neurological: Epicritic and protective threshold grossly intact bilaterally.   Musculoskeletal Exam: There continues to be diffuse pain on palpation to multiple areas of the right foot. Most significant along the peroneal tendon sheath and plantar fascia. Range of motion within normal limits to all pedal and ankle joints bilateral. Muscle strength 5/5 in all groups bilateral.   Assessment: 1. Peroneal tendinitis right 2. Plantar fasciitis right 3. Chronic diffuse pain in right foot   Plan of Care:  1. Patient was evaluated. 2. Unna boot helped significantly last week. Patient does not want another application of Unna boot 3. Injection of 0.5 mL Celestone Soluspan injected along the peroneal tendon sheath right lower extremity. Care was taken to avoid direct injection into the tendon 4. Today we are going to dispense an ankle brace for support of the foot and ankle structures 5. Patient may begin taking diclofenac 75 mg. Prescription sent to pharmacy 6. Patient has been seen in the past by Dr. Ardelle AntonWagoner, Leeanne Deeduchman, and other physicians for this pain. MRI was ordered in May 2018, however it was only of the forefoot. 7. Return to clinic in 4 weeks. If the patient has not improved significantly  we will order a new MRI to include the right rear foot and tarsal structures   Felecia ShellingBrent M. Lawrence Roldan, DPM [facility]  Dr. Felecia ShellingBrent M. Kahleb Mcclane, DPM    [address]                                        [place], [place] [address]                Office [phone]  Fax [phone]

## 2017-06-01 ENCOUNTER — Ambulatory Visit: Payer: BLUE CROSS/BLUE SHIELD | Admitting: Podiatry

## 2017-06-15 ENCOUNTER — Ambulatory Visit: Payer: BLUE CROSS/BLUE SHIELD | Admitting: Podiatry

## 2017-06-15 ENCOUNTER — Other Ambulatory Visit: Payer: Self-pay | Admitting: Rheumatology

## 2017-06-16 NOTE — Telephone Encounter (Signed)
Last Visit: 07/29/16 Next Visit was due May 2018. Message sent to front to schedule patient.   Okay to refill Trazodone?

## 2017-06-16 NOTE — Telephone Encounter (Signed)
ok 

## 2017-06-19 ENCOUNTER — Telehealth: Payer: Self-pay | Admitting: Rheumatology

## 2017-06-19 NOTE — Telephone Encounter (Signed)
-----   Message from Henriette Combs, LPN sent at 2/44/0102  8:20 AM EDT ----- Regarding: Please schedule patient for a follow up visit Please schedule patient for a follow up visit. Patient was due May 2018. Thanks!

## 2017-06-19 NOTE — Telephone Encounter (Signed)
Left message on machine for patient to call back to schedule follow up.  

## 2017-06-22 ENCOUNTER — Other Ambulatory Visit: Payer: Self-pay | Admitting: Podiatry

## 2017-06-23 NOTE — Telephone Encounter (Signed)
Pt was to schedule a 4 week follow up after 04/2017 appt.

## 2017-09-09 ENCOUNTER — Other Ambulatory Visit: Payer: Self-pay | Admitting: Rheumatology

## 2017-09-10 NOTE — Telephone Encounter (Signed)
Last Visit: 07/29/16 Next Visit 11/16/17   Okay to refill Trazodone?

## 2017-09-10 NOTE — Telephone Encounter (Signed)
ok 

## 2017-11-02 NOTE — Progress Notes (Signed)
Office Visit Note  Patient: [name]             Date of Birth: August 12, 1956           MRN: [id number]             PCP: Lewis Moccasin, MD Referring: Lewis Moccasin, MD Visit Date: 11/16/2017 Occupation: @GUAROCC @    Subjective:  Left shoulder and right hip pain   History of Present Illness: Shaneil Yazdi Godden is a 62 y.o. female with history of fibromyalgia, osteoarthritis, and DDD of lumbar spine.  She has been having discomfort in the left trapezius and right hip area.  Left trapezius pain has been going on for almost a year now in the right trochanteric area for the last 6 months.  She has some discomfort in the right knee joint.  Activities of Daily Living:  Patient reports morning stiffness for 5-10 minutes.   Patient Reports nocturnal pain.  Difficulty dressing/grooming: Denies Difficulty climbing stairs: Reports Difficulty getting out of chair: Reports Difficulty using hands for taps, buttons, cutlery, and/or writing: Reports   Review of Systems  Constitutional: Positive for fatigue. Negative for night sweats and weakness.  HENT: Positive for mouth dryness. Negative for mouth sores and nose dryness.   Eyes: Negative for dryness.  Respiratory: Negative for cough and shortness of breath.   Cardiovascular: Negative for chest pain, palpitations, hypertension and swelling in legs/feet.  Gastrointestinal: Positive for constipation. Negative for blood in stool and diarrhea.  Endocrine: Negative for heat intolerance.  Genitourinary: Negative for painful urination and pelvic pain.  Musculoskeletal: Positive for arthralgias, joint pain and morning stiffness. Negative for joint swelling, myalgias, muscle weakness, muscle tenderness and myalgias.  Skin: Positive for hair loss. Negative for color change, rash, nodules/bumps, skin tightness, ulcers and sensitivity to sunlight.  Allergic/Immunologic: Negative for susceptible to infections.  Neurological: Negative for  dizziness, light-headedness, numbness, headaches, memory loss and night sweats.  Hematological: Negative for bruising/bleeding tendency.  Psychiatric/Behavioral: Negative for depressed mood and sleep disturbance. The patient is nervous/anxious.     PMFS History:  Patient Active Problem List   Diagnosis Date Noted  . Primary osteoarthritis of both knees 11/16/2017  . DDD (degenerative disc disease), lumbar 11/16/2017  . History of asthma 11/16/2017  . History of gastroesophageal reflux (GERD) 11/16/2017  . History of hypertension 11/16/2017  . History of neuropathy 11/16/2017  . History of hypercholesterolemia 11/16/2017  . Fibromyalgia 07/26/2016  . Greater trochanteric bursitis of both hips 07/26/2016  . Chronic fatigue 07/26/2016  . Other insomnia 07/26/2016  . Rectal bleeding 01/31/2014    Past Medical History:  Diagnosis Date  . Arthritis    RIGHT KNEE  . Asthma   . Chronic back pain   . DDD (degenerative disc disease), lumbar   . Diabetes mellitus type 2, diet-controlled (HCC)   . Fibromyalgia   . GERD (gastroesophageal reflux disease)   . Hemorrhoids    INTERNAL AND EXTERNAL  . History of colon polyps    2009-- BENIGN  . Hyperlipidemia   . Hypertension   . PONV (postoperative nausea and vomiting)   . Right knee meniscal tear     Family History  Problem Relation Age of Onset  . Heart disease Mother    Past Surgical History:  Procedure Laterality Date  . CARDIOVASCULAR STRESS TEST  07-06-2013   no ischemia or infarct/  normal LV function and wall motion , ef 63%  . COLONOSCOPY W/ POLYPECTOMY  01-10-2008  .  KNEE ARTHROSCOPY Right 10-14-2006  . KNEE ARTHROSCOPY WITH LATERAL MENISECTOMY Right 05/18/2015   Procedure: KNEE ARTHROSCOPY WITH PARTIAL  LATERAL MENISECTOMY;  Surgeon: Durene Romans, MD;  Location: [facility];  Service: Orthopedics;  Laterality: Right;  . KNEE ARTHROSCOPY WITH MEDIAL MENISECTOMY Right 05/18/2015   Procedure: KNEE ARTHROSCOPY  WITH PARTIAL  MEDIAL MENISECTOMY;  Surgeon: Durene Romans, MD;  Location: [facility];  Service: Orthopedics;  Laterality: Right;  . PULLEY RELEASE RIGHT LONG AND SMALL FINGERS  01-30-2006  . RECONSTRUCTION OF ELBOW Bilateral right 06-03-2005  &  left  06-12-2011  . ROBOTIC ASSITED PARTIAL NEPHRECTOMY Left 10-16-2008   oncocytoma ( negative neoplasm)  . SHOULDER ARTHROSCOPY W/ SUBACROMIAL DECOMPRESSION AND DISTAL CLAVICLE EXCISION Left 07-07-2007   and ROTATOR CUFF REPAIR  . SIGMOIDOSCOPY  02-02-2014  . TOTAL ABDOMINAL HYSTERECTOMY W/ BILATERAL SALPINGOOPHORECTOMY  1979   Social History   Social History Narrative  . Not on file     Objective: Vital Signs: BP (!) 144/85 (BP Location: Left Arm, Patient Position: Sitting, Cuff Size: Normal)   Pulse 67   Resp 14   Ht 5' 6.5" (1.689 m)   Wt 199 lb (90.3 kg)   BMI 31.64 kg/m    Physical Exam  Constitutional: She is oriented to person, place, and time. She appears well-developed and well-nourished.  HENT:  Head: Normocephalic and atraumatic.  Eyes: Conjunctivae and EOM are normal.  Neck: Normal range of motion.  Cardiovascular: Normal rate, regular rhythm, normal heart sounds and intact distal pulses.  Pulmonary/Chest: Effort normal and breath sounds normal.  Abdominal: Soft. Bowel sounds are normal.  Lymphadenopathy:    She has no cervical adenopathy.  Neurological: She is alert and oriented to person, place, and time.  Skin: Skin is warm and dry. Capillary refill takes less than 2 seconds.  Psychiatric: She has a normal mood and affect. Her behavior is normal.  Nursing note and vitals reviewed.    Musculoskeletal Exam: C-spine thoracic lumbar spine some limitation with range of motion.  She had bilateral trapezius spasm.  Shoulder joints elbow joints wrist joint MCPs PIPs DIPs with good range of motion.  She has some crepitus in her knee joints without any warmth swelling or effusion.  Fibromyalgia tender points  were 16 out of 18 positive.  She had generalized hyperalgesia.  CDAI Exam: No CDAI exam completed.    Investigation: No additional findings.   Imaging: No results found.  Speciality Comments: No specialty comments available.    Procedures:  Trigger Point Inj Date/Time: 11/16/2017 12:48 PM Performed by: Pollyann Savoy, MD Authorized by: Pollyann Savoy, MD   Consent Given by:  Patient Site marked: the procedure site was marked   Timeout: prior to procedure the correct patient, procedure, and site was verified   Indications:  Muscle spasm and pain Total # of Trigger Points:  2 Location: neck   Needle Size:  27 G Approach:  Dorsal Medications #1:  0.5 mL lidocaine 1 %; 10 mg triamcinolone acetonide 40 MG/ML Medications #2:  0.5 mL lidocaine 1 %; 10 mg triamcinolone acetonide 40 MG/ML Patient tolerance:  Patient tolerated the procedure well with no immediate complications   Allergies: Macrobid [nitrofurantoin macrocrystal]; Seasonal ic [cholestatin]; Flexeril [cyclobenzaprine]; Penicillins; Percocet [oxycodone-acetaminophen]; and Zanaflex [tizanidine hcl]   Assessment / Plan:     Visit Diagnoses: Fibromyalgia: She has generalized pain discomfort and positive tender points.  She states she has been under a lot of stress that she has been caregiver  for her husband.  Primary osteoarthritis of both knees: She is some crepitus with no effusion.  She has been having some discomfort in her right knee joint.  Weight loss diet and exercise was discussed.    Neck pain: She has bilateral trapezius spasm.  Request bilateral trapezius area were injected with cortisone as described above.  A handout on neck exercises was given.  She also has a TENS unit at home which I advised her to apply in the neck area if needed.  Greater trochanteric bursitis of both hips: Her symptoms are consistent with trochanteric bursitis it was more prominent on the right side.  Her hip joints with good  range of motion.  IT band exercises was given.  If her symptoms persist she can come in for cortisone injection.  DDD (degenerative disc disease), lumbar: Core muscle strengthening was discussed.  Chronic fatigue: Related to insomnia.  Other insomnia: She uses trazodone for insomnia.  Other medical problems are listed as follows: History of asthma  History of gastroesophageal reflux (GERD)  History of hypertension  History of neuropathy  History of hypercholesterolemia    Orders: Orders Placed This Encounter  Procedures  . Trigger Point Inj   No orders of the defined types were placed in this encounter.   Face-to-face time spent with patient was 30 minutes.  Greater than 50% of time was spent in counseling and coordination of care.  Follow-Up Instructions: Return in about 6 months (around 05/16/2018) for FMS OA DDD.   Pollyann SavoyShaili Makila Colombe, MD  Note - This record has been created using Animal nutrition[employer]Dragon software.  Chart creation errors have been sought, but may not always  have been located. Such creation errors do not reflect on  the standard of medical care.

## 2017-11-09 DIAGNOSIS — M6530 Trigger finger, unspecified finger: Secondary | ICD-10-CM | POA: Insufficient documentation

## 2017-11-09 DIAGNOSIS — M79641 Pain in right hand: Secondary | ICD-10-CM | POA: Insufficient documentation

## 2017-11-09 DIAGNOSIS — R2230 Localized swelling, mass and lump, unspecified upper limb: Secondary | ICD-10-CM | POA: Insufficient documentation

## 2017-11-16 ENCOUNTER — Ambulatory Visit: Payer: BLUE CROSS/BLUE SHIELD | Admitting: Rheumatology

## 2017-11-16 ENCOUNTER — Encounter: Payer: Self-pay | Admitting: Rheumatology

## 2017-11-16 VITALS — BP 144/85 | HR 67 | Resp 14 | Ht 66.5 in | Wt 199.0 lb

## 2017-11-16 DIAGNOSIS — M51369 Other intervertebral disc degeneration, lumbar region without mention of lumbar back pain or lower extremity pain: Secondary | ICD-10-CM

## 2017-11-16 DIAGNOSIS — M7061 Trochanteric bursitis, right hip: Secondary | ICD-10-CM

## 2017-11-16 DIAGNOSIS — Z8639 Personal history of other endocrine, nutritional and metabolic disease: Secondary | ICD-10-CM | POA: Diagnosis not present

## 2017-11-16 DIAGNOSIS — M5136 Other intervertebral disc degeneration, lumbar region: Secondary | ICD-10-CM | POA: Diagnosis not present

## 2017-11-16 DIAGNOSIS — M542 Cervicalgia: Secondary | ICD-10-CM | POA: Diagnosis not present

## 2017-11-16 DIAGNOSIS — Z8719 Personal history of other diseases of the digestive system: Secondary | ICD-10-CM | POA: Diagnosis not present

## 2017-11-16 DIAGNOSIS — M797 Fibromyalgia: Secondary | ICD-10-CM

## 2017-11-16 DIAGNOSIS — Z8679 Personal history of other diseases of the circulatory system: Secondary | ICD-10-CM | POA: Diagnosis not present

## 2017-11-16 DIAGNOSIS — M7062 Trochanteric bursitis, left hip: Secondary | ICD-10-CM

## 2017-11-16 DIAGNOSIS — M170 Bilateral primary osteoarthritis of knee: Secondary | ICD-10-CM | POA: Diagnosis not present

## 2017-11-16 DIAGNOSIS — Z8709 Personal history of other diseases of the respiratory system: Secondary | ICD-10-CM | POA: Diagnosis not present

## 2017-11-16 DIAGNOSIS — G4709 Other insomnia: Secondary | ICD-10-CM

## 2017-11-16 DIAGNOSIS — Z8669 Personal history of other diseases of the nervous system and sense organs: Secondary | ICD-10-CM

## 2017-11-16 DIAGNOSIS — R5382 Chronic fatigue, unspecified: Secondary | ICD-10-CM | POA: Diagnosis not present

## 2017-11-16 MED ORDER — TRIAMCINOLONE ACETONIDE 40 MG/ML IJ SUSP
10.0000 mg | INTRAMUSCULAR | Status: AC | PRN
  Administered 2017-11-16: 10 mg via INTRAMUSCULAR

## 2017-11-16 MED ORDER — LIDOCAINE HCL 1 % IJ SOLN
0.5000 mL | INTRAMUSCULAR | Status: AC | PRN
  Administered 2017-11-16: .5 mL

## 2017-11-16 NOTE — Patient Instructions (Signed)
Iliotibial Band Syndrome Rehab Ask your health care provider which exercises are safe for you. Do exercises exactly as told by your health care provider and adjust them as directed. It is normal to feel mild stretching, pulling, tightness, or discomfort as you do these exercises, but you should stop right away if you feel sudden pain or your pain gets worse.Do not begin these exercises until told by your health care provider. Stretching and range of motion exercises These exercises warm up your muscles and joints and improve the movement and flexibility of your hip and pelvis. Exercise A: Quadriceps, prone  1. Lie on your abdomen on a firm surface, such as a bed or padded floor. 2. Bend your left / right knee and hold your ankle. If you cannot reach your ankle or pant leg, loop a belt around your foot and grab the belt instead. 3. Gently pull your heel toward your buttocks. Your knee should not slide out to the side. You should feel a stretch in the front of your thigh and knee. 4. Hold this position for __________ seconds. Repeat __________ times. Complete this stretch __________ times a day. Exercise B: Iliotibial band  1. Lie on your side with your left / right leg in the top position. 2. Bend both of your knees and grab your left / right ankle. Stretch out your bottom arm to help you balance. 3. Slowly bring your top knee back so your thigh goes behind your trunk. 4. Slowly lower your top leg toward the floor until you feel a gentle stretch on the outside of your left / right hip and thigh. If you do not feel a stretch and your knee will not fall farther, place the heel of your other foot on top of your knee and pull your knee down toward the floor with your foot. 5. Hold this position for __________ seconds. Repeat __________ times. Complete this stretch __________ times a day. Strengthening exercises These exercises build strength and endurance in your hip and pelvis. Endurance is the  ability to use your muscles for a long time, even after they get tired. Exercise C: Straight leg raises ( hip abductors) 1. Lie on your side with your left / right leg in the top position. Lie so your head, shoulder, knee, and hip line up. You may bend your bottom knee to help you balance. 2. Roll your hips slightly forward so your hips are stacked directly over each other and your left / right knee is facing forward. 3. Tense the muscles in your outer thigh and lift your top leg 4-6 inches (10-15 cm). 4. Hold this position for __________ seconds. 5. Slowly return to the starting position. Let your muscles relax completely before doing another repetition. Repeat __________ times. Complete this exercise __________ times a day. Exercise D: Straight leg raises ( hip extensors) 1. Lie on your abdomen on your bed or a firm surface. You can put a pillow under your hips if that is more comfortable. 2. Bend your left / right knee so your foot is straight up in the air. 3. Squeeze your buttock muscles and lift your left / right thigh off the bed. Do not let your back arch. 4. Tense this muscle as hard as you can without increasing any knee pain. 5. Hold this position for __________ seconds. 6. Slowly lower your leg to the starting position and allow it to relax completely. Repeat __________ times. Complete this exercise __________ times a day. Exercise E: Hip   hike 1. Stand sideways on a bottom step. Stand on your left / right leg with your other foot unsupported next to the step. You can hold onto the railing or wall if needed for balance. 2. Keep your knees straight and your torso square. Then, lift your left / right hip up toward the ceiling. 3. Slowly let your left / right hip lower toward the floor, past the starting position. Your foot should get closer to the floor. Do not lean or bend your knees. Repeat __________ times. Complete this exercise __________ times a day. This information is not  intended to replace advice given to you by your health care provider. Make sure you discuss any questions you have with your health care provider. Document Released: 09/08/2005 Document Revised: 05/13/2016 Document Reviewed: 08/10/2015 Elsevier Interactive Patient Education  2018 [employer] Cervical Strain and Sprain Rehab Ask your health care provider which exercises are safe for you. Do exercises exactly as told by your health care provider and adjust them as directed. It is normal to feel mild stretching, pulling, tightness, or discomfort as you do these exercises, but you should stop right away if you feel sudden pain or your pain gets worse.Do not begin these exercises until told by your health care provider. Stretching and range of motion exercises These exercises warm up your muscles and joints and improve the movement and flexibility of your neck. These exercises also help to relieve pain, numbness, and tingling. Exercise A: Cervical side bend  1. Using good posture, sit on a stable chair or stand up. 2. Without moving your shoulders, slowly tilt your left / right ear to your shoulder until you feel a stretch in your neck muscles. You should be looking straight ahead. 3. Hold for __________ seconds. 4. Repeat with the other side of your neck. Repeat __________ times. Complete this exercise __________ times a day. Exercise B: Cervical rotation  1. Using good posture, sit on a stable chair or stand up. 2. Slowly turn your head to the side as if you are looking over your left / right shoulder. ? Keep your eyes level with the ground. ? Stop when you feel a stretch along the side and the back of your neck. 3. Hold for __________ seconds. 4. Repeat this by turning to your other side. Repeat __________ times. Complete this exercise __________ times a day. Exercise C: Thoracic extension and pectoral stretch 1. Roll a towel or a small blanket so it is about 4 inches (10 cm) in  diameter. 2. Lie down on your back on a firm surface. 3. Put the towel lengthwise, under your spine in the middle of your back. It should not be not under your shoulder blades. The towel should line up with your spine from your middle back to your lower back. 4. Put your hands behind your head and let your elbows fall out to your sides. 5. Hold for __________ seconds. Repeat __________ times. Complete this exercise __________ times a day. Strengthening exercises These exercises build strength and endurance in your neck. Endurance is the ability to use your muscles for a long time, even after your muscles get tired. Exercise D: Upper cervical flexion, isometric 1. Lie on your back with a thin pillow behind your head and a small rolled-up towel under your neck. 2. Gently tuck your chin toward your chest and nod your head down to look toward your feet. Do not lift your head off the pillow. 3. Hold for __________ seconds. 4.   Release the tension slowly. Relax your neck muscles completely before you repeat this exercise. Repeat __________ times. Complete this exercise __________ times a day. Exercise E: Cervical extension, isometric  1. Stand about 6 inches (15 cm) away from a wall, with your back facing the wall. 2. Place a soft object, about 6-8 inches (15-20 cm) in diameter, between the back of your head and the wall. A soft object could be a small pillow, a ball, or a folded towel. 3. Gently tilt your head back and press into the soft object. Keep your jaw and forehead relaxed. 4. Hold for __________ seconds. 5. Release the tension slowly. Relax your neck muscles completely before you repeat this exercise. Repeat __________ times. Complete this exercise __________ times a day. Posture and body mechanics  Body mechanics refers to the movements and positions of your body while you do your daily activities. Posture is part of body mechanics. Good posture and healthy body mechanics can help to  relieve stress in your body's tissues and joints. Good posture means that your spine is in its natural S-curve position (your spine is neutral), your shoulders are pulled back slightly, and your head is not tipped forward. The following are general guidelines for applying improved posture and body mechanics to your everyday activities. Standing  When standing, keep your spine neutral and keep your feet about hip-width apart. Keep a slight bend in your knees. Your ears, shoulders, and hips should line up.  When you do a task in which you stand in one place for a long time, place one foot up on a stable object that is 2-4 inches (5-10 cm) high, such as a footstool. This helps keep your spine neutral. Sitting   When sitting, keep your spine neutral and your keep feet flat on the floor. Use a footrest, if necessary, and keep your thighs parallel to the floor. Avoid rounding your shoulders, and avoid tilting your head forward.  When working at a desk or a computer, keep your desk at a height where your hands are slightly lower than your elbows. Slide your chair under your desk so you are close enough to maintain good posture.  When working at a computer, place your monitor at a height where you are looking straight ahead and you do not have to tilt your head forward or downward to look at the screen. Resting When lying down and resting, avoid positions that are most painful for you. Try to support your neck in a neutral position. You can use a contour pillow or a small rolled-up towel. Your pillow should support your neck but not push on it. This information is not intended to replace advice given to you by your health care provider. Make sure you discuss any questions you have with your health care provider. Document Released: 09/08/2005 Document Revised: 05/15/2016 Document Reviewed: 08/15/2015 Elsevier Interactive Patient Education  2018 [employer]  

## 2017-11-23 ENCOUNTER — Other Ambulatory Visit: Payer: Self-pay | Admitting: Orthopedic Surgery

## 2017-12-05 ENCOUNTER — Other Ambulatory Visit: Payer: Self-pay | Admitting: Rheumatology

## 2017-12-07 NOTE — Telephone Encounter (Signed)
Last Visit: 11/16/17 Next Visit: 05/18/18  Okay to refill Trazodone?

## 2017-12-07 NOTE — Telephone Encounter (Signed)
ok 

## 2018-01-20 HISTORY — PX: LIPOMA EXCISION: SHX5283

## 2018-03-04 ENCOUNTER — Other Ambulatory Visit: Payer: Self-pay | Admitting: Rheumatology

## 2018-03-04 NOTE — Telephone Encounter (Signed)
Last Visit: 11/16/17 Next Visit: 05/18/18  Okay to refill Trazodone?

## 2018-03-04 NOTE — Telephone Encounter (Signed)
ok 

## 2018-03-17 DIAGNOSIS — M6530 Trigger finger, unspecified finger: Secondary | ICD-10-CM | POA: Insufficient documentation

## 2018-05-18 ENCOUNTER — Ambulatory Visit: Payer: BLUE CROSS/BLUE SHIELD | Admitting: Rheumatology

## 2018-05-18 NOTE — Progress Notes (Signed)
Office Visit Note  Patient: [name]             Date of Birth: 13-Feb-1956           MRN: [id number]             PCP: Lewis Moccasinewey, Elizabeth R, MD Referring: Lewis Moccasinewey, Elizabeth R, MD Visit Date: 06/01/2018 Occupation: @GUAROCC @  Subjective:  Trapezius muscle spasms   History of Present Illness: [name] is a 62 y.o. female with history of fibromyalgia, osteoarthritis, and DDD.  She takes trazodone 50 mg 1 tablet at bedtime for insomnia.  She is on gabapentin 300 mg by mouth daily.  She continues to have generalized muscle aches and muscle tenderness.  She has been having worsening fatigue recently.  She states that she is wakes up feeling unrested.  She states that she has difficulty falling sleep at night even though she takes melatonin and trazodone at bedtime.  She is having tension and muscle spasms in the trapezius muscles bilaterally.  She would like trigger point injections today in the office.  She continues to have bilateral trochanteric bursitis and tries to perform stretching exercises on a regular basis.  She also ices her trochanter bursa bilaterally on a daily basis.  She has chronic lower back pain.  She is also having discomfort in her right knee joint and notices intermittent joint swelling.  She denies any other joint pain or joint swelling at this time.  She states that she plans on starting to go back to [employer] to work out on a regular basis.    Activities of Daily Living:  Patient reports morning stiffness for 45 minutes.   Patient Reports nocturnal pain.  Difficulty dressing/grooming: Denies Difficulty climbing stairs: Reports Difficulty getting out of chair: Reports Difficulty using hands for taps, buttons, cutlery, and/or writing: Denies  Review of Systems  Constitutional: Positive for fatigue.  HENT: Negative for mouth sores, mouth dryness and nose dryness.   Eyes: Positive for dryness. Negative for pain and visual disturbance.  Respiratory:  Negative for cough, hemoptysis, shortness of breath and difficulty breathing.   Cardiovascular: Negative for chest pain, palpitations, hypertension and swelling in legs/feet.  Gastrointestinal: Negative for blood in stool, constipation and diarrhea.  Endocrine: Negative for increased urination.  Genitourinary: Negative for painful urination.  Musculoskeletal: Positive for arthralgias, joint pain, joint swelling, myalgias, morning stiffness, muscle tenderness and myalgias. Negative for muscle weakness.  Skin: Negative for color change, pallor, rash, hair loss, nodules/bumps, skin tightness, ulcers and sensitivity to sunlight.  Allergic/Immunologic: Negative for susceptible to infections.  Neurological: Positive for dizziness (Hx of vertigo ). Negative for numbness, headaches and weakness.  Hematological: Negative for swollen glands.  Psychiatric/Behavioral: Positive for sleep disturbance. Negative for depressed mood. The patient is nervous/anxious.     PMFS History:  Patient Active Problem List   Diagnosis Date Noted  . Primary osteoarthritis of both knees 11/16/2017  . DDD (degenerative disc disease), lumbar 11/16/2017  . History of asthma 11/16/2017  . History of gastroesophageal reflux (GERD) 11/16/2017  . History of hypertension 11/16/2017  . History of neuropathy 11/16/2017  . History of hypercholesterolemia 11/16/2017  . Fibromyalgia 07/26/2016  . Greater trochanteric bursitis of both hips 07/26/2016  . Chronic fatigue 07/26/2016  . Other insomnia 07/26/2016  . Rectal bleeding 01/31/2014    Past Medical History:  Diagnosis Date  . Arthritis    RIGHT KNEE  . Asthma   . Chronic back pain   . DDD (  degenerative disc disease), lumbar   . Diabetes mellitus type 2, diet-controlled (HCC)   . Fibromyalgia   . GERD (gastroesophageal reflux disease)   . Hemorrhoids    INTERNAL AND EXTERNAL  . History of colon polyps    2009-- BENIGN  . Hyperlipidemia   . Hypertension   . PONV  (postoperative nausea and vomiting)   . Right knee meniscal tear     Family History  Problem Relation Age of Onset  . Heart disease Mother   . Heart attack Mother   . Arthritis Father   . Diabetes Sister   . Diabetes Brother   . Hypertension Brother    Past Surgical History:  Procedure Laterality Date  . CARDIOVASCULAR STRESS TEST  07-06-2013   no ischemia or infarct/  normal LV function and wall motion , ef 63%  . COLONOSCOPY W/ POLYPECTOMY  01-10-2008  . KNEE ARTHROSCOPY Right 10-14-2006  . KNEE ARTHROSCOPY WITH LATERAL MENISECTOMY Right 05/18/2015   Procedure: KNEE ARTHROSCOPY WITH PARTIAL  LATERAL MENISECTOMY;  Surgeon: Durene Romans, MD;  Location: [facility];  Service: Orthopedics;  Laterality: Right;  . KNEE ARTHROSCOPY WITH MEDIAL MENISECTOMY Right 05/18/2015   Procedure: KNEE ARTHROSCOPY WITH PARTIAL  MEDIAL MENISECTOMY;  Surgeon: Durene Romans, MD;  Location: [facility];  Service: Orthopedics;  Laterality: Right;  . LIPOMA EXCISION Right 01/2018   right thumb lipoma   . PULLEY RELEASE RIGHT LONG AND SMALL FINGERS  01-30-2006  . RECONSTRUCTION OF ELBOW Bilateral right 06-03-2005  &  left  06-12-2011  . ROBOTIC ASSITED PARTIAL NEPHRECTOMY Left 10-16-2008   oncocytoma ( negative neoplasm)  . SHOULDER ARTHROSCOPY W/ SUBACROMIAL DECOMPRESSION AND DISTAL CLAVICLE EXCISION Left 07-07-2007   and ROTATOR CUFF REPAIR  . SIGMOIDOSCOPY  02-02-2014  . TOTAL ABDOMINAL HYSTERECTOMY W/ BILATERAL SALPINGOOPHORECTOMY  1979   Social History   Social History Narrative  . Not on file    Objective: Vital Signs: BP 135/84 (BP Location: Left Arm, Patient Position: Sitting, Cuff Size: Normal)   Pulse 68   Resp 15   Ht 5\' 6"  (1.676 m)   Wt 197 lb 12.8 oz (89.7 kg)   BMI 31.93 kg/m    Physical Exam  Constitutional: She is oriented to person, place, and time. She appears well-developed and well-nourished.  HENT:  Head: Normocephalic and atraumatic.  Eyes:  Conjunctivae and EOM are normal.  Neck: Normal range of motion.  Cardiovascular: Normal rate, regular rhythm, normal heart sounds and intact distal pulses.  Pulmonary/Chest: Effort normal and breath sounds normal.  Abdominal: Soft. Bowel sounds are normal.  Lymphadenopathy:    She has no cervical adenopathy.  Neurological: She is alert and oriented to person, place, and time.  Skin: Skin is warm and dry. Capillary refill takes less than 2 seconds.  Psychiatric: She has a normal mood and affect. Her behavior is normal.  Nursing note and vitals reviewed.    Musculoskeletal Exam: Generalized hyperalgesia on exam. C-spine, thoracic spine, and lumbar spine good ROM.  Trapezius muscle tension bilaterally.  Shoulder joints, elbow joints, wrist joints, MCPs, PIPs, and DIPs good ROM with no synovitis.  She has complete fist formation bilaterally.  Hip joints, knee joints, ankle joints, MTPs, PIPs, and DIPs good ROM with no synovitis.  No warmth or effusion of knee joints. Tenderness of trochanteric bursa bilaterally.     CDAI Exam: CDAI Score: Not documented Patient Global Assessment: Not documented; Provider Global Assessment: Not documented Swollen: Not documented; Tender: Not documented Joint  Exam   Not documented   There is currently no information documented on the homunculus. Go to the Rheumatology activity and complete the homunculus joint exam.  Investigation: No additional findings.  Imaging: No results found.  Recent Labs: Lab Results  Component Value Date   WBC 8.4 05/08/2015   HGB 13.2 05/08/2015   PLT 164 05/08/2015   NA 138 05/08/2015   K 3.3 (L) 05/08/2015   CL 101 05/08/2015   CO2 28 05/08/2015   GLUCOSE 135 (H) 05/08/2015   BUN 13 05/08/2015   CREATININE 1.07 (H) 05/08/2015   BILITOT 0.3 06/28/2013   ALKPHOS 68 06/28/2013   AST 14 06/28/2013   ALT 11 06/28/2013   PROT 7.6 06/28/2013   ALBUMIN 3.8 06/28/2013   CALCIUM 9.5 05/08/2015   GFRAA >60 05/08/2015     Speciality Comments: No specialty comments available.  Procedures:  Trigger Point Inj Date/Time: 06/01/2018 11:47 AM Performed by: Gearldine Bienenstock, PA-C Authorized by: Gearldine Bienenstock, PA-C   Consent Given by:  Patient Site marked: the procedure site was marked   Timeout: prior to procedure the correct patient, procedure, and site was verified   Indications:  Pain Total # of Trigger Points:  2 Location: neck   Needle Size:  27 G Approach:  Dorsal Medications #1:  0.5 mL lidocaine 1 %; 10 mg triamcinolone acetonide 40 MG/ML Medications #2:  0.5 mL lidocaine 1 %; 10 mg triamcinolone acetonide 40 MG/ML Patient tolerance:  Patient tolerated the procedure well with no immediate complications   Allergies: Macrobid [nitrofurantoin macrocrystal]; Seasonal ic [cholestatin]; Flexeril [cyclobenzaprine]; Penicillins; Percocet [oxycodone-acetaminophen]; and Zanaflex [tizanidine hcl]   Assessment / Plan:     Visit Diagnoses: Fibromyalgia: She has generalized hyperalgesia on exam.  She continues to have generalized muscle aches and muscle tenderness due to fibromyalgia. She has having bilateral trapezius muscle spasms and tension today.  She requested bilateral trapezius trigger point injections.  She tolerated the procedure well.  She was advised to monitor blood pressure closely following the cortisone injections. She has chronic fatigue and insomnia.  She wakes up feeling unrested in the morning.  She takes trazodone and melatonin at bedtime but continues to have difficulty falling asleep at night.  Refill of trazodone was sent to the pharmacy yesterday.  She takes gabapentin 300 mg by mouth daily.  We discussed good sleep hygiene habits. We also discussed the importance of exercising on a regular basis. She plans on going to [employer] on a regular basis.    Primary osteoarthritis of both knees: No warmth or effusion.  She has right knee discomfort.  She has had 2 arthroscopic surgeries  performed by Dr. Charlann Boxer.  She also had Visco gel injections in the past.  Greater trochanteric bursitis of both hips: She has tenderness of bilateral trochanteric bursa.  She perform stretching exercises on a daily basis.  She also ices her trochanter bursa daily.  Trapezius muscle spasm: She has muscle tension and muscle spasms in the trapezius muscles bilaterally.  She requested bilateral trigger point injections today in the office.  She tolerated the procedure well.  She was advised to monitor blood pressure closely following the cortisone injections.  We also discussed going for a massage and using a heating pad.   DDD (degenerative disc disease), lumbar: Chronic pain   Chronic fatigue: Worsening and related to insomnia.  We discussed the importance of staying active and exercising on a regular basis.    Other insomnia: She takes melatonin  and trazodone 50 mg 1 tablet at bedtime.  We discussed good sleep hygiene habits.  Other medical conditions are listed as follows:  History of asthma  History of gastroesophageal reflux (GERD)  History of hypertension  History of neuropathy  History of hypercholesterolemia   Orders: Orders Placed This Encounter  Procedures  . Trigger Point Inj   No orders of the defined types were placed in this encounter.   Face-to-face time spent with patient was 30 minutes. Greater than 50% of time was spent in counseling and coordination of care.  Follow-Up Instructions: Return in about 6 months (around 11/30/2018) for Fibromyalgia, Osteoarthritis.   Gearldine Bienenstock, PA-C  Note - This record has been created using Dragon software.  Chart creation errors have been sought, but may not always  have been located. Such creation errors do not reflect on  the standard of medical care.

## 2018-05-29 ENCOUNTER — Other Ambulatory Visit: Payer: Self-pay | Admitting: Rheumatology

## 2018-05-31 NOTE — Telephone Encounter (Signed)
Last Visit: 11/16/17 Next Visit: 06/01/18  Okay to refill Trazodone?

## 2018-06-01 ENCOUNTER — Encounter: Payer: Self-pay | Admitting: Physician Assistant

## 2018-06-01 ENCOUNTER — Ambulatory Visit: Payer: BLUE CROSS/BLUE SHIELD | Admitting: Physician Assistant

## 2018-06-01 VITALS — BP 135/84 | HR 68 | Resp 15 | Ht 66.0 in | Wt 197.8 lb

## 2018-06-01 DIAGNOSIS — G4709 Other insomnia: Secondary | ICD-10-CM

## 2018-06-01 DIAGNOSIS — Z8679 Personal history of other diseases of the circulatory system: Secondary | ICD-10-CM

## 2018-06-01 DIAGNOSIS — Z8669 Personal history of other diseases of the nervous system and sense organs: Secondary | ICD-10-CM

## 2018-06-01 DIAGNOSIS — M797 Fibromyalgia: Secondary | ICD-10-CM

## 2018-06-01 DIAGNOSIS — M7061 Trochanteric bursitis, right hip: Secondary | ICD-10-CM | POA: Diagnosis not present

## 2018-06-01 DIAGNOSIS — M170 Bilateral primary osteoarthritis of knee: Secondary | ICD-10-CM | POA: Diagnosis not present

## 2018-06-01 DIAGNOSIS — M5136 Other intervertebral disc degeneration, lumbar region: Secondary | ICD-10-CM | POA: Diagnosis not present

## 2018-06-01 DIAGNOSIS — M62838 Other muscle spasm: Secondary | ICD-10-CM

## 2018-06-01 DIAGNOSIS — Z8639 Personal history of other endocrine, nutritional and metabolic disease: Secondary | ICD-10-CM

## 2018-06-01 DIAGNOSIS — Z8719 Personal history of other diseases of the digestive system: Secondary | ICD-10-CM

## 2018-06-01 DIAGNOSIS — M7062 Trochanteric bursitis, left hip: Secondary | ICD-10-CM

## 2018-06-01 DIAGNOSIS — Z8709 Personal history of other diseases of the respiratory system: Secondary | ICD-10-CM

## 2018-06-01 DIAGNOSIS — M51369 Other intervertebral disc degeneration, lumbar region without mention of lumbar back pain or lower extremity pain: Secondary | ICD-10-CM

## 2018-06-01 DIAGNOSIS — R5382 Chronic fatigue, unspecified: Secondary | ICD-10-CM

## 2018-06-01 MED ORDER — TRIAMCINOLONE ACETONIDE 40 MG/ML IJ SUSP
10.0000 mg | INTRAMUSCULAR | Status: AC | PRN
  Administered 2018-06-01: 10 mg via INTRAMUSCULAR

## 2018-06-01 MED ORDER — LIDOCAINE HCL 1 % IJ SOLN
0.5000 mL | INTRAMUSCULAR | Status: AC | PRN
  Administered 2018-06-01: .5 mL

## 2018-07-09 ENCOUNTER — Encounter: Payer: Self-pay | Admitting: Podiatry

## 2018-07-09 ENCOUNTER — Ambulatory Visit: Payer: BLUE CROSS/BLUE SHIELD | Admitting: Podiatry

## 2018-07-09 ENCOUNTER — Encounter

## 2018-07-09 ENCOUNTER — Ambulatory Visit: Payer: BLUE CROSS/BLUE SHIELD

## 2018-07-09 ENCOUNTER — Ambulatory Visit (INDEPENDENT_AMBULATORY_CARE_PROVIDER_SITE_OTHER): Payer: BLUE CROSS/BLUE SHIELD

## 2018-07-09 DIAGNOSIS — M722 Plantar fascial fibromatosis: Secondary | ICD-10-CM

## 2018-07-09 MED ORDER — TRIAMCINOLONE ACETONIDE 10 MG/ML IJ SUSP
10.0000 mg | Freq: Once | INTRAMUSCULAR | Status: AC
  Administered 2018-07-09: 10 mg

## 2018-07-09 MED ORDER — MELOXICAM 15 MG PO TABS: 15.0000 mg | ORAL_TABLET | Freq: Every day | ORAL | 0 refills | Status: DC

## 2018-07-09 NOTE — Patient Instructions (Signed)

## 2018-07-11 DIAGNOSIS — M722 Plantar fascial fibromatosis: Secondary | ICD-10-CM | POA: Insufficient documentation

## 2018-07-11 NOTE — Progress Notes (Signed)
Subjective: 62 year old female presents the office today for concerns of left heel pain to the plantar heels with the arch of her foot which is been ongoing for about 1 month.  She denies any recent injury or trauma.  She has some intermittent swelling to the heel continuously.  The pain does not wake her up at night.  Start up intermittently become more chronic.  She has a history of this on the right foot in the right foot is been doing well however the pain is now on the left side. Denies any systemic complaints such as fevers, chills, nausea, vomiting. No acute changes since last appointment, and no other complaints at this time.   Objective: AAO x3, NAD DP/PT pulses palpable bilaterally, CRT less than 3 seconds Tenderness to palpation along the plantar medial tubercle of the calcaneus at the insertion of plantar fascia on the left foot. There is no pain along the course of the plantar fascia within the arch of the foot. Plantar fascia appears to be intact. There is no pain with lateral compression of the calcaneus or pain with vibratory sensation. There is no pain along the course or insertion of the achilles tendon. No other areas of tenderness to bilateral lower extremities.No open lesions or pre-ulcerative lesions.  Negative Tinel sign.  Equinus is present No pain with calf compression, swelling, warmth, erythema  Assessment: Left heel pain, plantar fasciitis  Plan: -All treatment options discussed with the patient including all alternatives, risks, complications.  -X-rays were obtained and reviewed with the patient.  No definitive evidence of acute fracture or stress fracture. -Steroid injection performed on the left side.  See procedure note below. -Continue plantar fascial brace and she Artie has this at home. -Night splint dispensed -Stretching, icing exercises daily -Treatment modifications and orthotics discussed -Patient encouraged to call the office with any questions, concerns,  change in symptoms.   Procedure: Injection Tendon/Ligament Discussed alternatives, risks, complications and verbal consent was obtained.  Location: Left plantar fascia at the glabrous junction; medial approach. Skin Prep: Alcohol. Injectate: 0.5cc 0.5% marcaine plain, 0.5 cc 2% lidocaine plain and, 1 cc kenalog 10. Disposition: Patient tolerated procedure well. Injection site dressed with a band-aid.  Post-injection care was discussed and return precautions discussed.   Vivi Barrack DPM

## 2018-07-19 ENCOUNTER — Ambulatory Visit: Payer: BLUE CROSS/BLUE SHIELD | Admitting: Podiatry

## 2018-08-02 ENCOUNTER — Ambulatory Visit: Payer: BLUE CROSS/BLUE SHIELD | Admitting: Podiatry

## 2018-08-02 ENCOUNTER — Other Ambulatory Visit: Payer: Self-pay

## 2018-08-02 DIAGNOSIS — M722 Plantar fascial fibromatosis: Secondary | ICD-10-CM

## 2018-08-02 DIAGNOSIS — M549 Dorsalgia, unspecified: Secondary | ICD-10-CM

## 2018-08-02 DIAGNOSIS — I10 Essential (primary) hypertension: Secondary | ICD-10-CM | POA: Insufficient documentation

## 2018-08-02 DIAGNOSIS — E119 Type 2 diabetes mellitus without complications: Secondary | ICD-10-CM | POA: Insufficient documentation

## 2018-08-02 DIAGNOSIS — E785 Hyperlipidemia, unspecified: Secondary | ICD-10-CM | POA: Insufficient documentation

## 2018-08-02 DIAGNOSIS — G8929 Other chronic pain: Secondary | ICD-10-CM | POA: Insufficient documentation

## 2018-08-02 MED ORDER — METHYLPREDNISOLONE 4 MG PO TBPK: ORAL_TABLET | ORAL | 0 refills | Status: DC

## 2018-08-03 NOTE — Progress Notes (Signed)
   Subjective: 62 year old female presenting today for follow up evaluation of plantar fasciitis of the left foot. She states her pain has improved but is not completely alleviated. She has been using the fascial brace and night splint and taking Motrin as directed for treatment. Patient is here for further evaluation and treatment.   Past Medical History:  Diagnosis Date  . Arthritis    RIGHT KNEE  . Asthma   . Chronic back pain   . DDD (degenerative disc disease), lumbar   . Diabetes mellitus type 2, diet-controlled (HCC)   . Fibromyalgia   . GERD (gastroesophageal reflux disease)   . Hemorrhoids    INTERNAL AND EXTERNAL  . History of colon polyps    2009-- BENIGN  . Hyperlipidemia   . Hypertension   . PONV (postoperative nausea and vomiting)   . Right knee meniscal tear      Objective: Physical Exam General: The patient is alert and oriented x3 in no acute distress.  Dermatology: Skin is warm, dry and supple bilateral lower extremities. Negative for open lesions or macerations bilateral.   Vascular: Dorsalis Pedis and Posterior Tibial pulses palpable bilateral.  Capillary fill time is immediate to all digits.  Neurological: Epicritic and protective threshold intact bilateral.   Musculoskeletal: Tenderness to palpation to the plantar aspect of the left heel along the plantar fascia. All other joints range of motion within normal limits bilateral. Strength 5/5 in all groups bilateral.   Assessment: 1. Plantar fasciitis left foot  Plan of Care:  1. Patient evaluated.    2. Injection of 0.5cc Celestone soluspan injected into the left plantar fascia.  3. Rx for Medrol Dose Pak placed. Then continue taking OTC Motrin as needed. 4. CAM boot dispensed.  5. Return to clinic in 4 weeks. If not better, we will order an MRI.      Felecia ShellingBrent M. , DPM [facility]  Dr. Felecia ShellingBrent M. , DPM    [address]                                     [place], [place]  [address]                Office [phone]) [phone]  Fax [phone]) [phone]

## 2018-08-31 ENCOUNTER — Other Ambulatory Visit: Payer: Self-pay | Admitting: Physician Assistant

## 2018-09-01 ENCOUNTER — Ambulatory Visit: Payer: BLUE CROSS/BLUE SHIELD | Admitting: Podiatry

## 2018-09-01 ENCOUNTER — Encounter: Payer: Self-pay | Admitting: Podiatry

## 2018-09-01 DIAGNOSIS — M722 Plantar fascial fibromatosis: Secondary | ICD-10-CM

## 2018-09-01 NOTE — Telephone Encounter (Signed)
ok 

## 2018-09-01 NOTE — Telephone Encounter (Signed)
Last Visit: 06/01/18 Next Visit: 11/30/18  Okay to refill Trazodone?

## 2018-09-05 NOTE — Progress Notes (Signed)
   Subjective: 62 year old female presenting today for follow up evaluation of plantar fasciitis of the left foot. She states her pain has improved but has not completely resolved. She has been using a CAM boot and feels like she needs to wear it at least one more week. Walking increases her pain. Patient is here for further evaluation and treatment.   Past Medical History:  Diagnosis Date  . Arthritis    RIGHT KNEE  . Asthma   . Chronic back pain   . DDD (degenerative disc disease), lumbar   . Diabetes mellitus type 2, diet-controlled (HCC)   . Fibromyalgia   . GERD (gastroesophageal reflux disease)   . Hemorrhoids    INTERNAL AND EXTERNAL  . History of colon polyps    2009-- BENIGN  . Hyperlipidemia   . Hypertension   . PONV (postoperative nausea and vomiting)   . Right knee meniscal tear      Objective: Physical Exam General: The patient is alert and oriented x3 in no acute distress.  Dermatology: Skin is warm, dry and supple bilateral lower extremities. Negative for open lesions or macerations bilateral.   Vascular: Dorsalis Pedis and Posterior Tibial pulses palpable bilateral.  Capillary fill time is immediate to all digits.  Neurological: Epicritic and protective threshold intact bilateral.   Musculoskeletal: Tenderness to palpation to the plantar aspect of the left heel along the plantar fascia. All other joints range of motion within normal limits bilateral. Strength 5/5 in all groups bilateral.   Assessment: 1. Plantar fasciitis left foot - improved   Plan of Care:  1. Patient evaluated.    2. Injection of 0.5cc Celestone soluspan injected into the left plantar fascia.  3. Continue using CAM boot for another two weeks. Then resume wearing good shoe gear with custom orthotics.  4. Continue using night splint.  5. Return to clinic in 4 weeks.      Felecia ShellingBrent M. Evans, DPM [facility]  Dr. Felecia ShellingBrent M. Evans, DPM    [address]                                      [place], [place] [address]                Office [phone]  Fax [phone]) [phone]

## 2018-10-04 ENCOUNTER — Ambulatory Visit: Payer: BLUE CROSS/BLUE SHIELD | Admitting: Podiatry

## 2018-10-17 ENCOUNTER — Ambulatory Visit (HOSPITAL_COMMUNITY)
Admission: EM | Admit: 2018-10-17 | Discharge: 2018-10-17 | Disposition: A | Discharge status: Home / Self Care | Payer: PRIVATE HEALTH INSURANCE | Attending: Physician Assistant | Admitting: Physician Assistant

## 2018-10-17 ENCOUNTER — Other Ambulatory Visit: Payer: Self-pay

## 2018-10-17 ENCOUNTER — Encounter (HOSPITAL_COMMUNITY): Payer: Self-pay | Admitting: Emergency Medicine

## 2018-10-17 DIAGNOSIS — K529 Noninfective gastroenteritis and colitis, unspecified: Secondary | ICD-10-CM | POA: Diagnosis not present

## 2018-10-17 MED ORDER — ONDANSETRON HCL 4 MG PO TABS: 4.0000 mg | ORAL_TABLET | Freq: Three times a day (TID) | ORAL | 0 refills | Status: DC | PRN

## 2018-10-17 MED ORDER — LOPERAMIDE HCL 2 MG PO CAPS: 2.0000 mg | ORAL_CAPSULE | Freq: Four times a day (QID) | ORAL | 0 refills | Status: DC | PRN

## 2018-10-17 NOTE — ED Triage Notes (Signed)
The patient presented to the [facility] with a complaint of abdominal cramping with N/V/D that started yesterday am.

## 2018-10-17 NOTE — Discharge Instructions (Signed)
Start the Imodium when you get home.  You can take 2 tabs at once and then 1 tab for every loose stool thereafter up to 4 tabs daily.  Start the Zofran.  Pick up your favorite beverage and drink copious amounts of fluid.

## 2018-10-17 NOTE — ED Provider Notes (Signed)
10/17/2018 1:42 PM   DOB: [birth year] / MRN: [id number]  SUBJECTIVE:  [name] is a 63 y.o. female presenting for abdominal pain associated with diarrhea, nausea, emesis.  Symptoms started yesterday and are improving.  Reports a fever of 101 last night which is also resolved.  She denies dizziness with standing.  She denies blood in the diarrhea.  She is allergic to macrobid [nitrofurantoin macrocrystal]; seasonal ic [cholestatin]; flexeril [cyclobenzaprine]; penicillins; percocet [oxycodone-acetaminophen]; and zanaflex [tizanidine hcl].   She  has a past medical history of Arthritis, Asthma, Chronic back pain, DDD (degenerative disc disease), lumbar, Diabetes mellitus type 2, diet-controlled (HCC), Fibromyalgia, GERD (gastroesophageal reflux disease), Hemorrhoids, History of colon polyps, Hyperlipidemia, Hypertension, PONV (postoperative nausea and vomiting), and Right knee meniscal tear.    She  reports that she has quit smoking. She has never used smokeless tobacco. She reports that she does not drink alcohol or use drugs. She  has no history on file for sexual activity. The patient  has a past surgical history that includes Shoulder arthroscopy w/ subacromial decompression and distal clavicle excision (Left, 07-07-2007); Robotic assited partial nephrectomy (Left, 10-16-2008); RECONSTRUCTION OF ELBOW (Bilateral, right 06-03-2005  &  left  06-12-2011); PULLEY RELEASE RIGHT LONG AND SMALL FINGERS (01-30-2006); Knee arthroscopy (Right, 10-14-2006); Total abdominal hysterectomy w/ bilateral salpingoophorectomy (1979); Sigmoidoscopy (02-02-2014); Colonoscopy w/ polypectomy (01-10-2008); Cardiovascular stress test (07-06-2013); Knee arthroscopy with medial menisectomy (Right, 05/18/2015); Knee arthroscopy with lateral menisectomy (Right, 05/18/2015); and Lipoma excision (Right, 01/2018).  Her family history includes Arthritis in her father; Diabetes in her brother and sister; Heart attack in her mother;  Heart disease in her mother; Hypertension in her brother.  ROS per HPI  OBJECTIVE:  BP 133/75 (BP Location: Right Arm)   Pulse 77   Temp 98.1 F (36.7 C) (Oral)   Resp 16   SpO2 99%   Wt Readings from Last 3 Encounters:  06/01/18 197 lb 12.8 oz (89.7 kg)  11/16/17 199 lb (90.3 kg)  07/29/16 194 lb (88 kg)   Temp Readings from Last 3 Encounters:  10/17/18 98.1 F (36.7 C) (Oral)  05/27/16 98.6 F (37 C)  05/18/15 97.8 F (36.6 C)   BP Readings from Last 3 Encounters:  10/17/18 133/75  06/01/18 135/84  11/16/17 (!) 144/85   Pulse Readings from Last 3 Encounters:  10/17/18 77  06/01/18 68  11/16/17 67    Physical Exam Vitals signs reviewed.  Constitutional:      General: She is not in acute distress.    Appearance: She is not diaphoretic.  Eyes:     Pupils: Pupils are equal, round, and reactive to light.  Cardiovascular:     Rate and Rhythm: Normal rate.  Pulmonary:     Effort: Pulmonary effort is normal.  Abdominal:     General: Abdomen is flat. Bowel sounds are normal. There is no distension.     Palpations: Abdomen is soft. There is no mass.     Tenderness: There is no abdominal tenderness. There is no right CVA tenderness, left CVA tenderness, guarding or rebound.     Hernia: No hernia is present.  Skin:    General: Skin is dry.  Neurological:     Mental Status: She is alert and oriented to person, place, and time.     Cranial Nerves: No cranial nerve deficit.     Gait: Gait normal.     No results found for this or any previous visit (from the past 72 hour(s)).  No  results found.  ASSESSMENT AND PLAN:   Noninfectious gastroenteritis, unspecified type - Likely viral given resolving symptoms and fever.  Advised minimize fluid losses with medication and to hydrate adequately.  Patient in agreement.    Discharge Instructions     Start the Imodium when you get home.  You can take 2 tabs at once and then 1 tab for every loose stool thereafter up  to 4 tabs daily.  Start the Zofran.  Pick up your favorite beverage and drink copious amounts of fluid.        The patient is advised to call or return to clinic if she does not see an improvement in symptoms, or to seek the care of the closest emergency department if she worsens with the above plan.   Deliah Boston, MHS, PA-C 10/17/2018 1:42 PM   Virginia Neas, PA-C 10/17/18 1342

## 2018-11-16 NOTE — Progress Notes (Signed)
Office Visit Note  Patient: [name]             Date of Birth: December 15, 1955           MRN: [id number]             PCP: Lewis Moccasin, MD Referring: Lewis Moccasin, MD Visit Date: 11/30/2018 Occupation: @GUAROCC @  Subjective:  trapezius muscle spasms   History of Present Illness: Niala Cabbagestalk Brozek is a 63 y.o. female with history of fibromyalgia, DDD, and osteoarthritis.  She continues to have trapezius muscle spasms and tenderness.  She would like trapezius trigger point injections today.  She continues have generalized muscle aches muscle tenderness bilaterally.  She reports that her level of fatigue has been stable but she continues to have chronic insomnia.  She states that she has difficulty falling asleep as well as staying asleep at night.  She takes trazodone 50 mg 1 tablet by mouth at bedtime.  She like a refill today.  She reports that she has been seeing Dr. Logan Bores for left plantar fasciitis management.  She states that she has been wearing a boot when she has flares.  She reports she has pain in bilateral hands but denies any joint swelling.  She has a right ring trigger finger about several months.  She is been seen Dr. Amanda Pea who has tried injections but she reports she is ready to proceed with surgery if her insurance will cover it.    Activities of Daily Living:  Patient reports morning stiffness for 15-20 minutes.   Patient Denies nocturnal pain.  Difficulty dressing/grooming: Denies Difficulty climbing stairs: Denies Difficulty getting out of chair: Reports Difficulty using hands for taps, buttons, cutlery, and/or writing: Reports  Review of Systems  Constitutional: Positive for fatigue.  HENT: Negative for mouth sores, mouth dryness and nose dryness.   Eyes: Negative for pain, visual disturbance and dryness.  Respiratory: Negative for cough, hemoptysis, shortness of breath and difficulty breathing.   Cardiovascular: Negative for chest pain, palpitations,  hypertension and swelling in legs/feet.  Gastrointestinal: Positive for constipation. Negative for blood in stool and diarrhea.  Endocrine: Negative for increased urination.  Genitourinary: Negative for painful urination.  Musculoskeletal: Positive for arthralgias, joint pain, myalgias, morning stiffness, muscle tenderness and myalgias. Negative for joint swelling and muscle weakness.  Skin: Negative for color change, pallor, rash, hair loss, nodules/bumps, skin tightness, ulcers and sensitivity to sunlight.  Allergic/Immunologic: Negative for susceptible to infections.  Neurological: Positive for tremors. Negative for dizziness (hx of vertigo), numbness, headaches and weakness.  Hematological: Negative for swollen glands.  Psychiatric/Behavioral: Positive for sleep disturbance. Negative for depressed mood. The patient is not nervous/anxious.     PMFS History:  Patient Active Problem List   Diagnosis Date Noted  . Benign hypertension 08/02/2018  . Chronic back pain 08/02/2018  . Diabetes mellitus (HCC) 08/02/2018  . Hyperlipidemia 08/02/2018  . Plantar fasciitis 07/11/2018  . Acquired trigger finger 03/17/2018  . Primary osteoarthritis of both knees 11/16/2017  . DDD (degenerative disc disease), lumbar 11/16/2017  . History of asthma 11/16/2017  . History of gastroesophageal reflux (GERD) 11/16/2017  . History of hypertension 11/16/2017  . History of neuropathy 11/16/2017  . History of hypercholesterolemia 11/16/2017  . Mass of hand 11/09/2017  . Pain in right hand 11/09/2017  . Trigger finger 11/09/2017  . Fibromyalgia 07/26/2016  . Greater trochanteric bursitis of both hips 07/26/2016  . Chronic fatigue 07/26/2016  . Other insomnia 07/26/2016  . Rectal  bleeding 01/31/2014    Past Medical History:  Diagnosis Date  . Arthritis    RIGHT KNEE  . Asthma   . Chronic back pain   . DDD (degenerative disc disease), lumbar   . Diabetes mellitus type 2, diet-controlled (HCC)   .  Fibromyalgia   . GERD (gastroesophageal reflux disease)   . Hemorrhoids    INTERNAL AND EXTERNAL  . History of colon polyps    2009-- BENIGN  . Hyperlipidemia   . Hypertension   . PONV (postoperative nausea and vomiting)   . Right knee meniscal tear     Family History  Problem Relation Age of Onset  . Heart disease Mother   . Heart attack Mother   . Arthritis Father   . Diabetes Sister   . Diabetes Brother   . Hypertension Brother   . Stomach cancer Brother    Past Surgical History:  Procedure Laterality Date  . CARDIOVASCULAR STRESS TEST  07-06-2013   no ischemia or infarct/  normal LV function and wall motion , ef 63%  . COLONOSCOPY W/ POLYPECTOMY  01-10-2008  . KNEE ARTHROSCOPY Right 10-14-2006  . KNEE ARTHROSCOPY WITH LATERAL MENISECTOMY Right 05/18/2015   Procedure: KNEE ARTHROSCOPY WITH PARTIAL  LATERAL MENISECTOMY;  Surgeon: Durene Romans, MD;  Location: [facility];  Service: Orthopedics;  Laterality: Right;  . KNEE ARTHROSCOPY WITH MEDIAL MENISECTOMY Right 05/18/2015   Procedure: KNEE ARTHROSCOPY WITH PARTIAL  MEDIAL MENISECTOMY;  Surgeon: Durene Romans, MD;  Location: [facility];  Service: Orthopedics;  Laterality: Right;  . LIPOMA EXCISION Right 01/2018   right thumb lipoma   . PULLEY RELEASE RIGHT LONG AND SMALL FINGERS  01-30-2006  . RECONSTRUCTION OF ELBOW Bilateral right 06-03-2005  &  left  06-12-2011  . ROBOTIC ASSITED PARTIAL NEPHRECTOMY Left 10-16-2008   oncocytoma ( negative neoplasm)  . SHOULDER ARTHROSCOPY W/ SUBACROMIAL DECOMPRESSION AND DISTAL CLAVICLE EXCISION Left 07-07-2007   and ROTATOR CUFF REPAIR  . SIGMOIDOSCOPY  02-02-2014  . TOTAL ABDOMINAL HYSTERECTOMY W/ BILATERAL SALPINGOOPHORECTOMY  1979   Social History   Social History Narrative  . Not on file   Immunization History  Administered Date(s) Administered  . Influenza,inj,Quad PF,6+ Mos 06/30/2018     Objective: Vital Signs: BP 139/84 (BP Location: Right  Arm, Patient Position: Sitting, Cuff Size: Normal)   Pulse 77   Resp 13   Ht 5\' 6"  (1.676 m)   Wt 194 lb (88 kg)   BMI 31.31 kg/m    Physical Exam Vitals signs and nursing note reviewed.  Constitutional:      Appearance: She is well-developed.  HENT:     Head: Normocephalic and atraumatic.  Eyes:     Conjunctiva/sclera: Conjunctivae normal.  Neck:     Musculoskeletal: Normal range of motion.  Cardiovascular:     Rate and Rhythm: Normal rate and regular rhythm.     Heart sounds: Normal heart sounds.  Pulmonary:     Effort: Pulmonary effort is normal.     Breath sounds: Normal breath sounds.  Abdominal:     General: Bowel sounds are normal.     Palpations: Abdomen is soft.  Lymphadenopathy:     Cervical: No cervical adenopathy.  Skin:    General: Skin is warm and dry.     Capillary Refill: Capillary refill takes less than 2 seconds.  Neurological:     Mental Status: She is alert and oriented to person, place, and time.  Psychiatric:  Behavior: Behavior normal.      Musculoskeletal Exam: Generalized hyperalgesia and positive tender points on exam.  C-spine good range of motion with no discomfort.  She has trapezius muscle spasms bilaterally.  Thoracic and lumbar spine good range of motion.  No midline spinal tenderness.  No SI joint tenderness.  Shoulder joints, elbow joints, strength, MCPs and PIPs and DIPs good range of motion no synovitis.  Right ring trigger finger noted.  Hip joints, knee joints, ankle joints, MTPs, PIPs, DIPs good range of motion with no synovitis.  No warmth or effusion bilateral knee joints.  No tender swelling of ankle joints.  Left plantar fasciitis.  CDAI Exam: CDAI Score: Not documented Patient Global Assessment: Not documented; Provider Global Assessment: Not documented Swollen: Not documented; Tender: Not documented Joint Exam   Not documented   There is currently no information documented on the homunculus. Go to the Rheumatology  activity and complete the homunculus joint exam.  Investigation: No additional findings.  Imaging: No results found.  Recent Labs: Lab Results  Component Value Date   WBC 8.4 05/08/2015   HGB 13.2 05/08/2015   PLT 164 05/08/2015   NA 138 05/08/2015   K 3.3 (L) 05/08/2015   CL 101 05/08/2015   CO2 28 05/08/2015   GLUCOSE 135 (H) 05/08/2015   BUN 13 05/08/2015   CREATININE 1.07 (H) 05/08/2015   BILITOT 0.3 06/28/2013   ALKPHOS 68 06/28/2013   AST 14 06/28/2013   ALT 11 06/28/2013   PROT 7.6 06/28/2013   ALBUMIN 3.8 06/28/2013   CALCIUM 9.5 05/08/2015   GFRAA >60 05/08/2015    Speciality Comments: No specialty comments available.  Procedures:  Trigger Point Inj Date/Time: 11/30/2018 9:34 AM Performed by: Gearldine Bienenstock, PA-C Authorized by: Gearldine Bienenstock, PA-C   Consent Given by:  Patient Site marked: the procedure site was marked   Timeout: prior to procedure the correct patient, procedure, and site was verified   Indications:  Pain Total # of Trigger Points:  2 Location: neck   Needle Size:  27 G Approach:  Dorsal Medications #1:  0.5 mL lidocaine 1 %; 10 mg triamcinolone acetonide 40 MG/ML Medications #2:  0.5 mL lidocaine 1 %; 10 mg triamcinolone acetonide 40 MG/ML Patient tolerance:  Patient tolerated the procedure well with no immediate complications   Allergies: Macrobid [nitrofurantoin macrocrystal]; Seasonal ic [cholestatin]; Flexeril [cyclobenzaprine]; Penicillins; Percocet [oxycodone-acetaminophen]; and Zanaflex [tizanidine hcl]   Assessment / Plan:     Visit Diagnoses: Fibromyalgia: She has generalized hyperalgesia and positive tender points on exam.  She is been having generalized muscle aches muscle tenderness.  She has trapezius muscle spasms bilaterally.  She requested trigger point injections which were performed today in the office.  The procedure notes are completed above.  Her level of fatigue has been stable.  She continues to have insomnia and  takes trazodone 50 mg 1 tablet by mouth at bedtime.  A refill of trazodone will be sent to the pharmacy today.  She started to exercise on a regular basis.  We discussed the importance of regular exercise and good sleep hygiene.  She will follow up in 6 months.   Primary osteoarthritis of both knees - She has had 2 arthroscopic surgeries performed by Dr. Charlann Boxer.  She also had Visco gel injections in the past.  She has no warmth or effusion on exam.  She experiences stiffness in both knees first thing in the morning lasting 15 to 20 minutes.  She was  given a list of natural anti-inflammatories that she can start taking.  She was also given information about the Mediterranean diet.  Greater trochanteric bursitis of both hips: She has tenderness over bilateral trochanteric bursa.   Trapezius muscle spasm: She has trapezius muscle tension and muscle tenderness bilaterally.  She has been experiencing muscle spasms.  She requested bilateral trigger point injections.  She tolerated the procedure well.  Procedure notes were completed above.  DDD (degenerative disc disease), lumbar: Chronic pain   Plantar fasciitis of left foot: She has been treated for plantar fasciitis for the past several months.  She has been following up with Dr. Logan Bores.  She wears a boot when she is having flares.  She reports that she will be following up with orthopedist to discuss surgery.  Trigger finger, right ring finger: Her right ring finger has been locking intermittently for the past 2-3 months.  She has been seeing Dr. Amanda Pea.  She has had several cortisone injections but her symptoms returned after several weeks.  She like to proceed with surgery if her insurance will cover it.  She will continue to follow-up with Dr. Amanda Pea.  Other insomnia - She takes melatonin and trazodone 50 mg 1 tablet at bedtime.  A refill of trazodone will be sent to the pharmacy today.  Chronic fatigue: Her level of fatigue has been stable recently.   She is started to workout on a regular basis.  She continues to take trazodone 50 mg 1 tablet by mouth at bedtime to help her sleep at night.  Other medical conditions are listed as follows:  History of hypercholesterolemia  History of gastroesophageal reflux (GERD)  History of hypertension  History of asthma  History of neuropathy   Orders: Orders Placed This Encounter  Procedures  . Trigger Point Inj   Meds ordered this encounter  Medications  . traZODone (DESYREL) 50 MG tablet    Sig: Take 1 tablet (50 mg total) by mouth at bedtime as needed.    Dispense:  90 tablet    Refill:  0    Face-to-face time spent with patient was 30 minutes. Greater than 50% of time was spent in counseling and coordination of care.  Follow-Up Instructions: Return in about 6 months (around 06/02/2019) for Fibromyalgia, Osteoarthritis, DDD.   Gearldine Bienenstock, PA-C   I examined and evaluated the patient with Sherron Ales PA.  Patient has generalized pain and discomfort from fibromyalgia.  She also has osteoarthritis in her knee joints which causes pain and discomfort.  She had tenderness on palpation over bilateral trapezius area on my examination.  Bilateral trigger point injections were given.  The plan of care was discussed as noted above.  Pollyann Savoy, MD Note - This record has been created using Animal nutritionist.  Chart creation errors have been sought, but may not always  have been located. Such creation errors do not reflect on  the standard of medical care.

## 2018-11-26 DIAGNOSIS — J301 Allergic rhinitis due to pollen: Secondary | ICD-10-CM | POA: Diagnosis not present

## 2018-11-26 DIAGNOSIS — J3089 Other allergic rhinitis: Secondary | ICD-10-CM | POA: Diagnosis not present

## 2018-11-29 DIAGNOSIS — M5106 Intervertebral disc disorders with myelopathy, lumbar region: Secondary | ICD-10-CM | POA: Diagnosis not present

## 2018-11-29 DIAGNOSIS — M545 Low back pain: Secondary | ICD-10-CM | POA: Diagnosis not present

## 2018-11-29 DIAGNOSIS — G894 Chronic pain syndrome: Secondary | ICD-10-CM | POA: Diagnosis not present

## 2018-11-30 ENCOUNTER — Ambulatory Visit: Payer: BLUE CROSS/BLUE SHIELD | Admitting: Rheumatology

## 2018-11-30 ENCOUNTER — Encounter: Payer: Self-pay | Admitting: Rheumatology

## 2018-11-30 VITALS — BP 139/84 | HR 77 | Resp 13 | Ht 66.0 in | Wt 194.0 lb

## 2018-11-30 DIAGNOSIS — Z8679 Personal history of other diseases of the circulatory system: Secondary | ICD-10-CM

## 2018-11-30 DIAGNOSIS — M62838 Other muscle spasm: Secondary | ICD-10-CM | POA: Diagnosis not present

## 2018-11-30 DIAGNOSIS — M797 Fibromyalgia: Secondary | ICD-10-CM | POA: Diagnosis not present

## 2018-11-30 DIAGNOSIS — M170 Bilateral primary osteoarthritis of knee: Secondary | ICD-10-CM | POA: Diagnosis not present

## 2018-11-30 DIAGNOSIS — Z8669 Personal history of other diseases of the nervous system and sense organs: Secondary | ICD-10-CM

## 2018-11-30 DIAGNOSIS — Z8639 Personal history of other endocrine, nutritional and metabolic disease: Secondary | ICD-10-CM

## 2018-11-30 DIAGNOSIS — M7061 Trochanteric bursitis, right hip: Secondary | ICD-10-CM

## 2018-11-30 DIAGNOSIS — G4709 Other insomnia: Secondary | ICD-10-CM

## 2018-11-30 DIAGNOSIS — M7062 Trochanteric bursitis, left hip: Secondary | ICD-10-CM

## 2018-11-30 DIAGNOSIS — R5382 Chronic fatigue, unspecified: Secondary | ICD-10-CM

## 2018-11-30 DIAGNOSIS — Z8719 Personal history of other diseases of the digestive system: Secondary | ICD-10-CM

## 2018-11-30 DIAGNOSIS — M5136 Other intervertebral disc degeneration, lumbar region: Secondary | ICD-10-CM

## 2018-11-30 DIAGNOSIS — M722 Plantar fascial fibromatosis: Secondary | ICD-10-CM

## 2018-11-30 DIAGNOSIS — Z8709 Personal history of other diseases of the respiratory system: Secondary | ICD-10-CM

## 2018-11-30 DIAGNOSIS — M65341 Trigger finger, right ring finger: Secondary | ICD-10-CM

## 2018-11-30 MED ORDER — TRIAMCINOLONE ACETONIDE 40 MG/ML IJ SUSP
10.0000 mg | INTRAMUSCULAR | Status: AC | PRN
  Administered 2018-11-30: 10 mg via INTRAMUSCULAR

## 2018-11-30 MED ORDER — LIDOCAINE HCL 1 % IJ SOLN
0.5000 mL | INTRAMUSCULAR | Status: AC | PRN
  Administered 2018-11-30: .5 mL

## 2018-11-30 MED ORDER — TRAZODONE HCL 50 MG PO TABS: 50.0000 mg | ORAL_TABLET | Freq: Every evening | ORAL | 0 refills | Status: DC | PRN

## 2018-11-30 NOTE — Patient Instructions (Signed)
Mediterranean Diet A Mediterranean diet refers to food and lifestyle choices that are based on the traditions of countries located on the [employer]. This way of eating has been shown to help prevent certain conditions and improve outcomes for people who have chronic diseases, like kidney disease and heart disease. What are tips for following this plan? Lifestyle  Cook and eat meals together with your family, when possible.  Drink enough fluid to keep your urine clear or pale yellow.  Be physically active every day. This includes: ? Aerobic exercise like running or swimming. ? Leisure activities like gardening, walking, or housework.  Get 7-8 hours of sleep each night.  If recommended by your health care provider, drink red wine in moderation. This means 1 glass a day for nonpregnant women and 2 glasses a day for men. A glass of wine equals 5 oz (150 mL). Reading food labels   Check the serving size of packaged foods. For foods such as rice and pasta, the serving size refers to the amount of cooked product, not dry.  Check the total fat in packaged foods. Avoid foods that have saturated fat or trans fats.  Check the ingredients list for added sugars, such as corn syrup. Shopping  At the grocery store, buy most of your food from the areas near the walls of the store. This includes: ? Fresh fruits and vegetables (produce). ? Grains, beans, nuts, and seeds. Some of these may be available in unpackaged forms or large amounts (in bulk). ? Fresh seafood. ? Poultry and eggs. ? Low-fat dairy products.  Buy whole ingredients instead of prepackaged foods.  Buy fresh fruits and vegetables in-season from local farmers markets.  Buy frozen fruits and vegetables in resealable bags.  If you do not have access to quality fresh seafood, buy precooked frozen shrimp or canned fish, such as tuna, salmon, or sardines.  Buy small amounts of raw or cooked vegetables, salads, or olives  from the deli or salad bar at your store.  Stock your pantry so you always have certain foods on hand, such as olive oil, canned tuna, canned tomatoes, rice, pasta, and beans. Cooking  Cook foods with extra-virgin olive oil instead of using butter or other vegetable oils.  Have meat as a side dish, and have vegetables or grains as your main dish. This means having meat in small portions or adding small amounts of meat to foods like pasta or stew.  Use beans or vegetables instead of meat in common dishes like chili or lasagna.  Experiment with different cooking methods. Try roasting or broiling vegetables instead of steaming or sauteing them.  Add frozen vegetables to soups, stews, pasta, or rice.  Add nuts or seeds for added healthy fat at each meal. You can add these to yogurt, salads, or vegetable dishes.  Marinate fish or vegetables using olive oil, lemon juice, garlic, and fresh herbs. Meal planning   Plan to eat 1 vegetarian meal one day each week. Try to work up to 2 vegetarian meals, if possible.  Eat seafood 2 or more times a week.  Have healthy snacks readily available, such as: ? Vegetable sticks with hummus. ? [place] yogurt. ? Fruit and nut trail mix.  Eat balanced meals throughout the week. This includes: ? Fruit: 2-3 servings a day ? Vegetables: 4-5 servings a day ? Low-fat dairy: 2 servings a day ? Fish, poultry, or lean meat: 1 serving a day ? Beans and legumes: 2 or more servings a week ?  Nuts and seeds: 1-2 servings a day ? Whole grains: 6-8 servings a day ? Extra-virgin olive oil: 3-4 servings a day  Limit red meat and sweets to only a few servings a month What are my food choices?  Mediterranean diet ? Recommended ? Grains: Whole-grain pasta. Brown rice. Bulgar wheat. Polenta. Couscous. Whole-wheat bread. Orpah Cobb. ? Vegetables: Artichokes. Beets. Broccoli. Cabbage. Carrots. Eggplant. Green beans. Chard. Kale. Spinach. Onions. Leeks. Peas.  Squash. Tomatoes. Peppers. Radishes. ? Fruits: Apples. Apricots. Avocado. Berries. Bananas. Cherries. Dates. Figs. Grapes. Lemons. Melon. Oranges. Peaches. Plums. Pomegranate. ? Meats and other protein foods: Beans. Almonds. Sunflower seeds. Pine nuts. Peanuts. Cod. Salmon. Scallops. Shrimp. Tuna. Tilapia. Clams. Oysters. Eggs. ? Dairy: Low-fat milk. Cheese. Greek yogurt. ? Beverages: Water. Red wine. Herbal tea. ? Fats and oils: Extra virgin olive oil. Avocado oil. Grape seed oil. ? Sweets and desserts: [place] yogurt with honey. Baked apples. Poached pears. Trail mix. ? Seasoning and other foods: Basil. Cilantro. Coriander. Cumin. Mint. Parsley. Sage. Rosemary. Tarragon. Garlic. Oregano. Thyme. Pepper. Balsalmic vinegar. Tahini. Hummus. Tomato sauce. Olives. Mushrooms. ? Limit these ? Grains: Prepackaged pasta or rice dishes. Prepackaged cereal with added sugar. ? Vegetables: Deep fried potatoes (french fries). ? Fruits: Fruit canned in syrup. ? Meats and other protein foods: Beef. Pork. Lamb. Poultry with skin. Hot dogs. Tomasa Blase. ? Dairy: Ice cream. Sour cream. Whole milk. ? Beverages: Juice. Sugar-sweetened soft drinks. Beer. Liquor and spirits. ? Fats and oils: Butter. Canola oil. Vegetable oil. Beef fat (tallow). Lard. ? Sweets and desserts: Cookies. Cakes. Pies. Candy. ? Seasoning and other foods: Mayonnaise. Premade sauces and marinades. ? The items listed may not be a complete list. Talk with your dietitian about what dietary choices are right for you. Summary  The Mediterranean diet includes both food and lifestyle choices.  Eat a variety of fresh fruits and vegetables, beans, nuts, seeds, and whole grains.  Limit the amount of red meat and sweets that you eat.  Talk with your health care provider about whether it is safe for you to drink red wine in moderation. This means 1 glass a day for nonpregnant women and 2 glasses a day for men. A glass of wine equals 5 oz (150 mL). This  information is not intended to replace advice given to you by your health care provider. Make sure you discuss any questions you have with your health care provider. Document Released: 05/01/2016 Document Revised: 06/03/2016 Document Reviewed: 05/01/2016 Elsevier Interactive Patient Education  2019 [employer].     Why follow it? Research shows. . Those who follow the Mediterranean diet have a reduced risk of heart disease  . The diet is associated with a reduced incidence of Parkinson's and Alzheimer's diseases . People following the diet may have longer life expectancies and lower rates of chronic diseases  . The Dietary Guidelines for Americans recommends the Mediterranean diet as an eating plan to promote health and prevent disease  What Is the Mediterranean Diet?  . Healthy eating plan based on typical foods and recipes of Mediterranean-style cooking . The diet is primarily a plant based diet; these foods should make up a majority of meals   Starches - Plant based foods should make up a majority of meals - They are an important sources of vitamins, minerals, energy, antioxidants, and fiber - Choose whole grains, foods high in fiber and minimally processed items  - Typical grain sources include wheat, oats, barley, corn, brown rice, bulgar, farro, millet, polenta, couscous  -  Various types of beans include chickpeas, lentils, fava beans, black beans, white beans   Fruits  Veggies - Large quantities of antioxidant rich fruits & veggies; 6 or more servings  - Vegetables can be eaten raw or lightly drizzled with oil and cooked  - Vegetables common to the traditional Mediterranean Diet include: artichokes, arugula, beets, broccoli, brussel sprouts, cabbage, carrots, celery, collard greens, cucumbers, eggplant, kale, leeks, lemons, lettuce, mushrooms, okra, onions, peas, peppers, potatoes, pumpkin, radishes, rutabaga, shallots, spinach, sweet potatoes, turnips, zucchini - Fruits common to the  Mediterranean Diet include: apples, apricots, avocados, cherries, clementines, dates, figs, grapefruits, grapes, melons, nectarines, oranges, peaches, pears, pomegranates, strawberries, tangerines  Fats - Replace butter and margarine with healthy oils, such as olive oil, canola oil, and tahini  - Limit nuts to no more than a handful a day  - Nuts include walnuts, almonds, pecans, pistachios, pine nuts  - Limit or avoid candied, honey roasted or heavily salted nuts - Olives are central to the [employer] - can be eaten whole or used in a variety of dishes   Meats Protein - Limiting red meat: no more than a few times a month - When eating red meat: choose lean cuts and keep the portion to the size of deck of cards - Eggs: approx. 0 to 4 times a week  - Fish and lean poultry: at least 2 a week  - Healthy protein sources include, chicken, [place], lean beef, lamb - Increase intake of seafood such as tuna, salmon, trout, mackerel, shrimp, scallops - Avoid or limit high fat processed meats such as sausage and bacon  Dairy - Include moderate amounts of low fat dairy products  - Focus on healthy dairy such as fat free yogurt, skim milk, low or reduced fat cheese - Limit dairy products higher in fat such as whole or 2% milk, cheese, ice cream  Alcohol - Moderate amounts of red wine is ok  - No more than 5 oz daily for women (all ages) and men older than age 9  - No more than 10 oz of wine daily for men younger than 51  Other - Limit sweets and other desserts  - Use herbs and spices instead of salt to flavor foods  - Herbs and spices common to the traditional Mediterranean Diet include: basil, bay leaves, chives, cloves, cumin, fennel, garlic, lavender, marjoram, mint, oregano, parsley, pepper, rosemary, sage, savory, sumac, tarragon, thyme   It's not just a diet, it's a lifestyle:  . The Mediterranean diet includes lifestyle factors typical of those in the region  . Foods, drinks and meals are  best eaten with others and savored . Daily physical activity is important for overall good health . This could be strenuous exercise like running and aerobics . This could also be more leisurely activities such as walking, housework, yard-work, or taking the stairs . Moderation is the key; a balanced and healthy diet accommodates most foods and drinks . Consider portion sizes and frequency of consumption of certain foods   Meal Ideas & Options:  . Breakfast:  o Whole wheat toast or whole wheat English muffins with peanut butter & hard boiled egg o Steel cut oats topped with apples & cinnamon and skim milk  o Fresh fruit: banana, strawberries, melon, berries, peaches  o Smoothies: strawberries, bananas, greek yogurt, peanut butter o Low fat greek yogurt with blueberries and granola  o Egg white omelet with spinach and mushrooms o Breakfast couscous: whole wheat couscous, apricots, skim  milk, cranberries  . Sandwiches:  o Hummus and grilled vegetables (peppers, zucchini, squash) on whole wheat bread   o Grilled chicken on whole wheat pita with lettuce, tomatoes, cucumbers or tzatziki  o Tuna salad on whole wheat bread: tuna salad made with greek yogurt, olives, red peppers, capers, green onions o Garlic rosemary lamb pita: lamb sauted with garlic, rosemary, salt & pepper; add lettuce, cucumber, greek yogurt to pita - flavor with lemon juice and black pepper  . Seafood:  o Mediterranean grilled salmon, seasoned with garlic, basil, parsley, lemon juice and black pepper o Shrimp, lemon, and spinach whole-grain pasta salad made with low fat greek yogurt  o Seared scallops with lemon orzo  o Seared tuna steaks seasoned salt, pepper, coriander topped with tomato mixture of olives, tomatoes, olive oil, minced garlic, parsley, green onions and cappers  . Meats:  o Herbed greek chicken salad with kalamata olives, cucumber, feta  o Red bell peppers stuffed with spinach, bulgur, lean ground beef (or  lentils) & topped with feta   o Kebabs: skewers of chicken, tomatoes, onions, zucchini, squash  o [place] burgers: made with red onions, mint, dill, lemon juice, feta cheese topped with roasted red peppers . Vegetarian o Cucumber salad: cucumbers, artichoke hearts, celery, red onion, feta cheese, tossed in olive oil & lemon juice  o Hummus and whole grain pita points with a greek salad (lettuce, tomato, feta, olives, cucumbers, red onion) o Lentil soup with celery, carrots made with vegetable broth, garlic, salt and pepper  o Tabouli salad: parsley, bulgur, mint, scallions, cucumbers, tomato, radishes, lemon juice, olive oil, salt and pepper.

## 2018-12-01 DIAGNOSIS — Z6831 Body mass index (BMI) 31.0-31.9, adult: Secondary | ICD-10-CM | POA: Diagnosis not present

## 2018-12-01 DIAGNOSIS — Z1231 Encounter for screening mammogram for malignant neoplasm of breast: Secondary | ICD-10-CM | POA: Diagnosis not present

## 2018-12-01 DIAGNOSIS — Z01419 Encounter for gynecological examination (general) (routine) without abnormal findings: Secondary | ICD-10-CM | POA: Diagnosis not present

## 2018-12-02 DIAGNOSIS — J3089 Other allergic rhinitis: Secondary | ICD-10-CM | POA: Diagnosis not present

## 2018-12-02 DIAGNOSIS — J301 Allergic rhinitis due to pollen: Secondary | ICD-10-CM | POA: Diagnosis not present

## 2018-12-08 DIAGNOSIS — J301 Allergic rhinitis due to pollen: Secondary | ICD-10-CM | POA: Diagnosis not present

## 2018-12-08 DIAGNOSIS — J3089 Other allergic rhinitis: Secondary | ICD-10-CM | POA: Diagnosis not present

## 2018-12-22 DIAGNOSIS — J3089 Other allergic rhinitis: Secondary | ICD-10-CM | POA: Diagnosis not present

## 2018-12-22 DIAGNOSIS — J301 Allergic rhinitis due to pollen: Secondary | ICD-10-CM | POA: Diagnosis not present

## 2018-12-30 DIAGNOSIS — K219 Gastro-esophageal reflux disease without esophagitis: Secondary | ICD-10-CM | POA: Diagnosis not present

## 2018-12-30 DIAGNOSIS — E782 Mixed hyperlipidemia: Secondary | ICD-10-CM | POA: Diagnosis not present

## 2018-12-30 DIAGNOSIS — F411 Generalized anxiety disorder: Secondary | ICD-10-CM | POA: Diagnosis not present

## 2018-12-30 DIAGNOSIS — I10 Essential (primary) hypertension: Secondary | ICD-10-CM | POA: Diagnosis not present

## 2019-01-05 DIAGNOSIS — J3089 Other allergic rhinitis: Secondary | ICD-10-CM | POA: Diagnosis not present

## 2019-01-05 DIAGNOSIS — J301 Allergic rhinitis due to pollen: Secondary | ICD-10-CM | POA: Diagnosis not present

## 2019-01-14 DIAGNOSIS — J301 Allergic rhinitis due to pollen: Secondary | ICD-10-CM | POA: Diagnosis not present

## 2019-01-17 DIAGNOSIS — J3089 Other allergic rhinitis: Secondary | ICD-10-CM | POA: Diagnosis not present

## 2019-01-17 DIAGNOSIS — J301 Allergic rhinitis due to pollen: Secondary | ICD-10-CM | POA: Diagnosis not present

## 2019-01-20 DIAGNOSIS — J301 Allergic rhinitis due to pollen: Secondary | ICD-10-CM | POA: Diagnosis not present

## 2019-01-20 DIAGNOSIS — J3089 Other allergic rhinitis: Secondary | ICD-10-CM | POA: Diagnosis not present

## 2019-01-28 DIAGNOSIS — M5106 Intervertebral disc disorders with myelopathy, lumbar region: Secondary | ICD-10-CM | POA: Diagnosis not present

## 2019-01-28 DIAGNOSIS — Z6830 Body mass index (BMI) 30.0-30.9, adult: Secondary | ICD-10-CM | POA: Diagnosis not present

## 2019-01-28 DIAGNOSIS — M545 Low back pain: Secondary | ICD-10-CM | POA: Diagnosis not present

## 2019-01-28 DIAGNOSIS — G894 Chronic pain syndrome: Secondary | ICD-10-CM | POA: Diagnosis not present

## 2019-02-03 DIAGNOSIS — J301 Allergic rhinitis due to pollen: Secondary | ICD-10-CM | POA: Diagnosis not present

## 2019-02-03 DIAGNOSIS — J3089 Other allergic rhinitis: Secondary | ICD-10-CM | POA: Diagnosis not present

## 2019-02-09 DIAGNOSIS — J301 Allergic rhinitis due to pollen: Secondary | ICD-10-CM | POA: Diagnosis not present

## 2019-02-09 DIAGNOSIS — J3089 Other allergic rhinitis: Secondary | ICD-10-CM | POA: Diagnosis not present

## 2019-02-16 DIAGNOSIS — J301 Allergic rhinitis due to pollen: Secondary | ICD-10-CM | POA: Diagnosis not present

## 2019-02-16 DIAGNOSIS — J3089 Other allergic rhinitis: Secondary | ICD-10-CM | POA: Diagnosis not present

## 2019-02-17 DIAGNOSIS — J4540 Moderate persistent asthma, uncomplicated: Secondary | ICD-10-CM | POA: Diagnosis not present

## 2019-02-17 DIAGNOSIS — H1045 Other chronic allergic conjunctivitis: Secondary | ICD-10-CM | POA: Diagnosis not present

## 2019-02-17 DIAGNOSIS — J301 Allergic rhinitis due to pollen: Secondary | ICD-10-CM | POA: Diagnosis not present

## 2019-02-17 DIAGNOSIS — J3089 Other allergic rhinitis: Secondary | ICD-10-CM | POA: Diagnosis not present

## 2019-02-18 DIAGNOSIS — J301 Allergic rhinitis due to pollen: Secondary | ICD-10-CM | POA: Diagnosis not present

## 2019-02-18 DIAGNOSIS — J3089 Other allergic rhinitis: Secondary | ICD-10-CM | POA: Diagnosis not present

## 2019-02-23 DIAGNOSIS — J301 Allergic rhinitis due to pollen: Secondary | ICD-10-CM | POA: Diagnosis not present

## 2019-02-23 DIAGNOSIS — J3089 Other allergic rhinitis: Secondary | ICD-10-CM | POA: Diagnosis not present

## 2019-03-01 DIAGNOSIS — J3089 Other allergic rhinitis: Secondary | ICD-10-CM | POA: Diagnosis not present

## 2019-03-01 DIAGNOSIS — J301 Allergic rhinitis due to pollen: Secondary | ICD-10-CM | POA: Diagnosis not present

## 2019-03-02 ENCOUNTER — Other Ambulatory Visit: Payer: Self-pay | Admitting: Physician Assistant

## 2019-03-02 NOTE — Telephone Encounter (Signed)
Last Visit: 11/30/18 Next Visit: 05/31/19  Okay to refill Trazodone?

## 2019-03-09 DIAGNOSIS — J301 Allergic rhinitis due to pollen: Secondary | ICD-10-CM | POA: Diagnosis not present

## 2019-03-09 DIAGNOSIS — J3089 Other allergic rhinitis: Secondary | ICD-10-CM | POA: Diagnosis not present

## 2019-03-10 DIAGNOSIS — R110 Nausea: Secondary | ICD-10-CM | POA: Diagnosis not present

## 2019-03-10 DIAGNOSIS — R300 Dysuria: Secondary | ICD-10-CM | POA: Diagnosis not present

## 2019-03-10 DIAGNOSIS — M545 Low back pain: Secondary | ICD-10-CM | POA: Diagnosis not present

## 2019-03-10 DIAGNOSIS — R350 Frequency of micturition: Secondary | ICD-10-CM | POA: Diagnosis not present

## 2019-03-11 DIAGNOSIS — J3089 Other allergic rhinitis: Secondary | ICD-10-CM | POA: Diagnosis not present

## 2019-03-11 DIAGNOSIS — J301 Allergic rhinitis due to pollen: Secondary | ICD-10-CM | POA: Diagnosis not present

## 2019-03-14 DIAGNOSIS — J301 Allergic rhinitis due to pollen: Secondary | ICD-10-CM | POA: Diagnosis not present

## 2019-03-14 DIAGNOSIS — J3089 Other allergic rhinitis: Secondary | ICD-10-CM | POA: Diagnosis not present

## 2019-03-24 DIAGNOSIS — J301 Allergic rhinitis due to pollen: Secondary | ICD-10-CM | POA: Diagnosis not present

## 2019-03-24 DIAGNOSIS — J3089 Other allergic rhinitis: Secondary | ICD-10-CM | POA: Diagnosis not present

## 2019-03-30 DIAGNOSIS — J3089 Other allergic rhinitis: Secondary | ICD-10-CM | POA: Diagnosis not present

## 2019-03-30 DIAGNOSIS — J301 Allergic rhinitis due to pollen: Secondary | ICD-10-CM | POA: Diagnosis not present

## 2019-03-30 DIAGNOSIS — G894 Chronic pain syndrome: Secondary | ICD-10-CM | POA: Diagnosis not present

## 2019-03-30 DIAGNOSIS — M545 Low back pain: Secondary | ICD-10-CM | POA: Diagnosis not present

## 2019-03-30 DIAGNOSIS — M5106 Intervertebral disc disorders with myelopathy, lumbar region: Secondary | ICD-10-CM | POA: Diagnosis not present

## 2019-04-05 DIAGNOSIS — J3089 Other allergic rhinitis: Secondary | ICD-10-CM | POA: Diagnosis not present

## 2019-04-05 DIAGNOSIS — J301 Allergic rhinitis due to pollen: Secondary | ICD-10-CM | POA: Diagnosis not present

## 2019-04-14 DIAGNOSIS — J301 Allergic rhinitis due to pollen: Secondary | ICD-10-CM | POA: Diagnosis not present

## 2019-04-14 DIAGNOSIS — J3089 Other allergic rhinitis: Secondary | ICD-10-CM | POA: Diagnosis not present

## 2019-04-20 DIAGNOSIS — J301 Allergic rhinitis due to pollen: Secondary | ICD-10-CM | POA: Diagnosis not present

## 2019-04-20 DIAGNOSIS — J3089 Other allergic rhinitis: Secondary | ICD-10-CM | POA: Diagnosis not present

## 2019-04-28 DIAGNOSIS — J301 Allergic rhinitis due to pollen: Secondary | ICD-10-CM | POA: Diagnosis not present

## 2019-04-28 DIAGNOSIS — J3089 Other allergic rhinitis: Secondary | ICD-10-CM | POA: Diagnosis not present

## 2019-05-04 DIAGNOSIS — J301 Allergic rhinitis due to pollen: Secondary | ICD-10-CM | POA: Diagnosis not present

## 2019-05-04 DIAGNOSIS — J3089 Other allergic rhinitis: Secondary | ICD-10-CM | POA: Diagnosis not present

## 2019-05-11 DIAGNOSIS — K219 Gastro-esophageal reflux disease without esophagitis: Secondary | ICD-10-CM | POA: Diagnosis not present

## 2019-05-11 DIAGNOSIS — I10 Essential (primary) hypertension: Secondary | ICD-10-CM | POA: Diagnosis not present

## 2019-05-11 DIAGNOSIS — R600 Localized edema: Secondary | ICD-10-CM | POA: Diagnosis not present

## 2019-05-12 DIAGNOSIS — J3089 Other allergic rhinitis: Secondary | ICD-10-CM | POA: Diagnosis not present

## 2019-05-12 DIAGNOSIS — J301 Allergic rhinitis due to pollen: Secondary | ICD-10-CM | POA: Diagnosis not present

## 2019-05-17 DIAGNOSIS — J3089 Other allergic rhinitis: Secondary | ICD-10-CM | POA: Diagnosis not present

## 2019-05-17 DIAGNOSIS — J301 Allergic rhinitis due to pollen: Secondary | ICD-10-CM | POA: Diagnosis not present

## 2019-05-17 NOTE — Progress Notes (Deleted)
Office Visit Note  Patient: [name]             Date of Birth: March 20, 1956           MRN: [id number]             PCP: Lewis Moccasinewey, Elizabeth R, MD Referring: Lewis Moccasinewey, Elizabeth R, MD Visit Date: 05/31/2019 Occupation: @GUAROCC @  Subjective:  No chief complaint on file.   History of Present Illness: [name] is a 63 y.o. female ***   Activities of Daily Living:  Patient reports morning stiffness for *** {minute/hour:19697}.   Patient {ACTIONS;DENIES/REPORTS:21021675::"Denies"} nocturnal pain.  Difficulty dressing/grooming: {ACTIONS;DENIES/REPORTS:21021675::"Denies"} Difficulty climbing stairs: {ACTIONS;DENIES/REPORTS:21021675::"Denies"} Difficulty getting out of chair: {ACTIONS;DENIES/REPORTS:21021675::"Denies"} Difficulty using hands for taps, buttons, cutlery, and/or writing: {ACTIONS;DENIES/REPORTS:21021675::"Denies"}  No Rheumatology ROS completed.   PMFS History:  Patient Active Problem List   Diagnosis Date Noted  . Benign hypertension 08/02/2018  . Chronic back pain 08/02/2018  . Diabetes mellitus (HCC) 08/02/2018  . Hyperlipidemia 08/02/2018  . Plantar fasciitis 07/11/2018  . Acquired trigger finger 03/17/2018  . Primary osteoarthritis of both knees 11/16/2017  . DDD (degenerative disc disease), lumbar 11/16/2017  . History of asthma 11/16/2017  . History of gastroesophageal reflux (GERD) 11/16/2017  . History of hypertension 11/16/2017  . History of neuropathy 11/16/2017  . History of hypercholesterolemia 11/16/2017  . Mass of hand 11/09/2017  . Pain in right hand 11/09/2017  . Trigger finger 11/09/2017  . Fibromyalgia 07/26/2016  . Greater trochanteric bursitis of both hips 07/26/2016  . Chronic fatigue 07/26/2016  . Other insomnia 07/26/2016  . Rectal bleeding 01/31/2014    Past Medical History:  Diagnosis Date  . Arthritis    RIGHT KNEE  . Asthma   . Chronic back pain   . DDD (degenerative disc disease), lumbar   . Diabetes mellitus type 2,  diet-controlled (HCC)   . Fibromyalgia   . GERD (gastroesophageal reflux disease)   . Hemorrhoids    INTERNAL AND EXTERNAL  . History of colon polyps    2009-- BENIGN  . Hyperlipidemia   . Hypertension   . PONV (postoperative nausea and vomiting)   . Right knee meniscal tear     Family History  Problem Relation Age of Onset  . Heart disease Mother   . Heart attack Mother   . Arthritis Father   . Diabetes Sister   . Diabetes Brother   . Hypertension Brother   . Stomach cancer Brother    Past Surgical History:  Procedure Laterality Date  . CARDIOVASCULAR STRESS TEST  07-06-2013   no ischemia or infarct/  normal LV function and wall motion , ef 63%  . COLONOSCOPY W/ POLYPECTOMY  01-10-2008  . KNEE ARTHROSCOPY Right 10-14-2006  . KNEE ARTHROSCOPY WITH LATERAL MENISECTOMY Right 05/18/2015   Procedure: KNEE ARTHROSCOPY WITH PARTIAL  LATERAL MENISECTOMY;  Surgeon: Durene RomansMatthew Olin, MD;  [facility];  Service: Orthopedics;  Laterality: Right;  . KNEE ARTHROSCOPY WITH MEDIAL MENISECTOMY Right 05/18/2015   Procedure: KNEE ARTHROSCOPY WITH PARTIAL  MEDIAL MENISECTOMY;  Surgeon: Durene RomansMatthew Olin, MD;  [facility];  Service: Orthopedics;  Laterality: Right;  . LIPOMA EXCISION Right 01/2018   right thumb lipoma   . PULLEY RELEASE RIGHT LONG AND SMALL FINGERS  01-30-2006  . RECONSTRUCTION OF ELBOW Bilateral right 06-03-2005  &  left  06-12-2011  . ROBOTIC ASSITED PARTIAL NEPHRECTOMY Left 10-16-2008   oncocytoma ( negative neoplasm)  . SHOULDER ARTHROSCOPY W/ SUBACROMIAL DECOMPRESSION AND  DISTAL CLAVICLE EXCISION Left 07-07-2007   and ROTATOR CUFF REPAIR  . SIGMOIDOSCOPY  02-02-2014  . TOTAL ABDOMINAL HYSTERECTOMY W/ BILATERAL SALPINGOOPHORECTOMY  1979   Social History   Social History Narrative  . Not on file   Immunization History  Administered Date(s) Administered  . Influenza,inj,Quad PF,6+ Mos 06/30/2018     Objective: Vital Signs:  There were no vitals taken for this visit.   Physical Exam   Musculoskeletal Exam: ***  CDAI Exam: CDAI Score: - Patient Global: -; Provider Global: - Swollen: -; Tender: - Joint Exam   No joint exam has been documented for this visit   There is currently no information documented on the homunculus. Go to the Rheumatology activity and complete the homunculus joint exam.  Investigation: No additional findings.  Imaging: No results found.  Recent Labs: Lab Results  Component Value Date   WBC 8.4 05/08/2015   HGB 13.2 05/08/2015   PLT 164 05/08/2015   NA 138 05/08/2015   K 3.3 (L) 05/08/2015   CL 101 05/08/2015   CO2 28 05/08/2015   GLUCOSE 135 (H) 05/08/2015   BUN 13 05/08/2015   CREATININE 1.07 (H) 05/08/2015   BILITOT 0.3 06/28/2013   ALKPHOS 68 06/28/2013   AST 14 06/28/2013   ALT 11 06/28/2013   PROT 7.6 06/28/2013   ALBUMIN 3.8 06/28/2013   CALCIUM 9.5 05/08/2015   GFRAA >60 05/08/2015    Speciality Comments: No specialty comments available.  Procedures:  No procedures performed Allergies: Macrobid [nitrofurantoin macrocrystal], Seasonal ic [cholestatin], Flexeril [cyclobenzaprine], Penicillins, Percocet [oxycodone-acetaminophen], and Zanaflex [tizanidine hcl]   Assessment / Plan:     Visit Diagnoses: No diagnosis found.  Orders: No orders of the defined types were placed in this encounter.  No orders of the defined types were placed in this encounter.   Face-to-face time spent with patient was *** minutes. Greater than 50% of time was spent in counseling and coordination of care.  Follow-Up Instructions: No follow-ups on file.   Earnestine Mealing, CMA  Note - This record has been created using Editor, commissioning.  Chart creation errors have been sought, but may not always  have been located. Such creation errors do not reflect on  the standard of medical care.

## 2019-05-26 DIAGNOSIS — J301 Allergic rhinitis due to pollen: Secondary | ICD-10-CM | POA: Diagnosis not present

## 2019-05-26 DIAGNOSIS — J3089 Other allergic rhinitis: Secondary | ICD-10-CM | POA: Diagnosis not present

## 2019-05-31 ENCOUNTER — Encounter: Payer: Self-pay | Admitting: Rheumatology

## 2019-05-31 ENCOUNTER — Telehealth (INDEPENDENT_AMBULATORY_CARE_PROVIDER_SITE_OTHER): Payer: Medicare Other | Admitting: Rheumatology

## 2019-05-31 DIAGNOSIS — M797 Fibromyalgia: Secondary | ICD-10-CM

## 2019-05-31 DIAGNOSIS — Z8639 Personal history of other endocrine, nutritional and metabolic disease: Secondary | ICD-10-CM

## 2019-05-31 DIAGNOSIS — M65341 Trigger finger, right ring finger: Secondary | ICD-10-CM

## 2019-05-31 DIAGNOSIS — Z6830 Body mass index (BMI) 30.0-30.9, adult: Secondary | ICD-10-CM | POA: Diagnosis not present

## 2019-05-31 DIAGNOSIS — M5106 Intervertebral disc disorders with myelopathy, lumbar region: Secondary | ICD-10-CM | POA: Diagnosis not present

## 2019-05-31 DIAGNOSIS — M62838 Other muscle spasm: Secondary | ICD-10-CM | POA: Diagnosis not present

## 2019-05-31 DIAGNOSIS — G4709 Other insomnia: Secondary | ICD-10-CM

## 2019-05-31 DIAGNOSIS — R5382 Chronic fatigue, unspecified: Secondary | ICD-10-CM

## 2019-05-31 DIAGNOSIS — M5136 Other intervertebral disc degeneration, lumbar region: Secondary | ICD-10-CM

## 2019-05-31 DIAGNOSIS — M545 Low back pain: Secondary | ICD-10-CM | POA: Diagnosis not present

## 2019-05-31 DIAGNOSIS — Z8679 Personal history of other diseases of the circulatory system: Secondary | ICD-10-CM

## 2019-05-31 DIAGNOSIS — Z8719 Personal history of other diseases of the digestive system: Secondary | ICD-10-CM

## 2019-05-31 DIAGNOSIS — M7061 Trochanteric bursitis, right hip: Secondary | ICD-10-CM | POA: Diagnosis not present

## 2019-05-31 DIAGNOSIS — M170 Bilateral primary osteoarthritis of knee: Secondary | ICD-10-CM

## 2019-05-31 DIAGNOSIS — Z8709 Personal history of other diseases of the respiratory system: Secondary | ICD-10-CM

## 2019-05-31 DIAGNOSIS — M7062 Trochanteric bursitis, left hip: Secondary | ICD-10-CM

## 2019-05-31 DIAGNOSIS — M51369 Other intervertebral disc degeneration, lumbar region without mention of lumbar back pain or lower extremity pain: Secondary | ICD-10-CM

## 2019-05-31 DIAGNOSIS — Z8669 Personal history of other diseases of the nervous system and sense organs: Secondary | ICD-10-CM

## 2019-05-31 DIAGNOSIS — M722 Plantar fascial fibromatosis: Secondary | ICD-10-CM

## 2019-05-31 DIAGNOSIS — G894 Chronic pain syndrome: Secondary | ICD-10-CM | POA: Diagnosis not present

## 2019-05-31 MED ORDER — TRAZODONE HCL 50 MG PO TABS: ORAL_TABLET | ORAL | 0 refills | Status: DC

## 2019-05-31 NOTE — Progress Notes (Signed)
Virtual Visit via Telephone Note  I connected with [name] on 05/31/19 at  3:45 PM EDT by telephone and verified that I am speaking with the correct person using two identifiers.  Location: Patient: Home  Provider: Clinic   This service was conducted via virtual visit.  The patient was located at home. I was located in my office.  Consent was obtained prior to the virtual visit and is aware of possible charges through their insurance for this visit.  The patient is an established patient.  Dr. Estanislado Pandy, MD conducted the virtual visit and Hazel Sams, PA-C acted as scribe during the service.  Office staff helped with scheduling follow up visits after the service was conducted.   I discussed the limitations, risks, security and privacy concerns of performing an evaluation and management service by telephone and the availability of in person appointments. I also discussed with the patient that there may be a patient responsible charge related to this service. The patient expressed understanding and agreed to proceed.  CC: Trapezius muscle spasms  History of Present Illness: Patient is a 63 year old female with a past medical history of fibromyalgia, osteoarthritis, and DDD.  She continues to have generalized muscle aches and muscle tenderness due to fibromyalgia.  She states have been manageable recently.  She takes hydrocodone for pain relief. She has pain in both knee joints.  She states the pain is worse after kneeling and working in the yard. She has had visco gel injections in the past. She denies any joint swelling. She is having trapezius muscle tension and tenderness, and would like to schedule trigger point injections in the future.  She continues to have a right ring trigger finger that has been painful and locking.  She has chronic fatigue related to fibromyalgia and insomnia.  She takes trazodone 50 mg 1 tablet by mouth at bedtime and melatonin 5 mg po at bedtime to help with  insomnia. She would like a refill of trazodone today.   Review of Systems  Constitutional: Positive for malaise/fatigue. Negative for fever.  Eyes: Negative for photophobia, pain, discharge and redness.  Respiratory: Negative for cough, shortness of breath and wheezing.   Cardiovascular: Negative for chest pain and palpitations.  Gastrointestinal: Negative for blood in stool, constipation and diarrhea.  Genitourinary: Negative for dysuria.  Musculoskeletal: Positive for back pain, joint pain, myalgias and neck pain.  Skin: Negative for rash.  Neurological: Negative for dizziness and headaches.  Psychiatric/Behavioral: Negative for depression. The patient has insomnia. The patient is not nervous/anxious.       Observations/Objective: Physical Exam  Constitutional: She is oriented to person, place, and time.  Neurological: She is alert and oriented to person, place, and time.  Psychiatric: Mood, memory, affect and judgment normal.   Patient reports morning stiffness for 15 minutes.   Patient denies nocturnal pain.  Difficulty dressing/grooming: Denies Difficulty climbing stairs: Denies Difficulty getting out of chair: Denies Difficulty using hands for taps, buttons, cutlery, and/or writing: Denies    Assessment and Plan: Visit Diagnoses: Fibromyalgia:  She continues have generalized muscle aches and muscle tenderness due to fibromyalgia.  She reports that overall her pain is been manageable.  She is experiencing trapezius muscle tension and muscle tenderness bilaterally.  She has muscle spasms occasionally.  She would like to schedule an appointment for trapezius trigger point injections.  She takes hydrocodone as needed for pain relief and gabapentin 300 mg 1 capsule by mouth daily.  She continues to have  chronic fatigue related to insomnia.  She takes trazodone 50 mg 1 tablet by mouth at bedtime and melatonin 5 mg by mouth at bedtime.  A refill of trazodone was sent to the pharmacy  today.  Good sleep hygiene was discussed.  She was encouraged to stay active and exercise on a regular basis.    Primary osteoarthritis of both knees: She has been having increased discomfort in both knee joints, worse in the right knee joint.  She denies any joint swelling or warmth.  She has had 2 right knee joint arthroscopic surgeries performed by Dr. Charlann Boxerlin in the past.  She also had Visco gel injections in the past.  We will obtain x-rays of both knee joints at her follow up visit. She would like to proceed with applying for visco injections for both knee joints after updating x-rays.   Greater trochanteric bursitis of both hips:  She has intermittent trochanteric bursitis bilaterally.  She has performed stretching exercises in the past.  Trapezius muscle spasm: She has muscle tension and muscle spasms in the trapezius muscles bilaterally.  She will schedule a visit for trapezius trigger point injections.   DDD (degenerative disc disease), lumbar: Chronic pain   Chronic fatigue: Stable and related to insomnia.     Other insomnia: She takes melatonin 5 mg po at bedtime and trazodone 50 mg 1 tablet at bedtime. A refill of trazodone 50 mg 1 tablet by mouth at bedtime will be sent to the pharmacy.   Other medical conditions are listed as follows:  History of asthma  History of gastroesophageal reflux (GERD)  History of hypertension  History of neuropathy  History of hypercholesterolemia   Follow Up Instructions: She will follow up in 1 month    I discussed the assessment and treatment plan with the patient. The patient was provided an opportunity to ask questions and all were answered. The patient agreed with the plan and demonstrated an understanding of the instructions.   The patient was advised to call back or seek an in-person evaluation if the symptoms worsen or if the condition fails to improve as anticipated.  I provided 15 minutes of non-face-to-face time during  this encounter. Pollyann SavoyShaili Deveshwar, MD   Scribed by-  Gearldine Bienenstockaylor M , PA-C

## 2019-06-02 DIAGNOSIS — J3089 Other allergic rhinitis: Secondary | ICD-10-CM | POA: Diagnosis not present

## 2019-06-02 DIAGNOSIS — J301 Allergic rhinitis due to pollen: Secondary | ICD-10-CM | POA: Diagnosis not present

## 2019-06-08 DIAGNOSIS — J3089 Other allergic rhinitis: Secondary | ICD-10-CM | POA: Diagnosis not present

## 2019-06-08 DIAGNOSIS — J301 Allergic rhinitis due to pollen: Secondary | ICD-10-CM | POA: Diagnosis not present

## 2019-06-13 DIAGNOSIS — M25572 Pain in left ankle and joints of left foot: Secondary | ICD-10-CM | POA: Diagnosis not present

## 2019-06-13 DIAGNOSIS — S8012XA Contusion of left lower leg, initial encounter: Secondary | ICD-10-CM | POA: Diagnosis not present

## 2019-06-13 DIAGNOSIS — J3089 Other allergic rhinitis: Secondary | ICD-10-CM | POA: Diagnosis not present

## 2019-06-13 DIAGNOSIS — J301 Allergic rhinitis due to pollen: Secondary | ICD-10-CM | POA: Diagnosis not present

## 2019-06-13 DIAGNOSIS — M79672 Pain in left foot: Secondary | ICD-10-CM | POA: Diagnosis not present

## 2019-06-15 DIAGNOSIS — J3081 Allergic rhinitis due to animal (cat) (dog) hair and dander: Secondary | ICD-10-CM | POA: Diagnosis not present

## 2019-06-15 DIAGNOSIS — J301 Allergic rhinitis due to pollen: Secondary | ICD-10-CM | POA: Diagnosis not present

## 2019-06-20 ENCOUNTER — Other Ambulatory Visit: Payer: Self-pay | Admitting: Physician Assistant

## 2019-06-20 NOTE — Telephone Encounter (Signed)
Please schedule patient for a follow up visit. Patient due October 2020. Thanks! 

## 2019-06-21 NOTE — Telephone Encounter (Signed)
LMOM for patient to call and schedule follow-up appointment.   °

## 2019-06-22 DIAGNOSIS — R600 Localized edema: Secondary | ICD-10-CM | POA: Diagnosis not present

## 2019-06-22 DIAGNOSIS — M84375G Stress fracture, left foot, subsequent encounter for fracture with delayed healing: Secondary | ICD-10-CM | POA: Diagnosis not present

## 2019-06-22 DIAGNOSIS — K219 Gastro-esophageal reflux disease without esophagitis: Secondary | ICD-10-CM | POA: Diagnosis not present

## 2019-06-22 DIAGNOSIS — I10 Essential (primary) hypertension: Secondary | ICD-10-CM | POA: Diagnosis not present

## 2019-06-29 DIAGNOSIS — Z23 Encounter for immunization: Secondary | ICD-10-CM | POA: Diagnosis not present

## 2019-07-01 DIAGNOSIS — J3089 Other allergic rhinitis: Secondary | ICD-10-CM | POA: Diagnosis not present

## 2019-07-01 DIAGNOSIS — J301 Allergic rhinitis due to pollen: Secondary | ICD-10-CM | POA: Diagnosis not present

## 2019-07-14 DIAGNOSIS — J3089 Other allergic rhinitis: Secondary | ICD-10-CM | POA: Diagnosis not present

## 2019-07-14 DIAGNOSIS — J301 Allergic rhinitis due to pollen: Secondary | ICD-10-CM | POA: Diagnosis not present

## 2019-07-27 DIAGNOSIS — J301 Allergic rhinitis due to pollen: Secondary | ICD-10-CM | POA: Diagnosis not present

## 2019-07-27 DIAGNOSIS — J3089 Other allergic rhinitis: Secondary | ICD-10-CM | POA: Diagnosis not present

## 2019-08-01 DIAGNOSIS — G894 Chronic pain syndrome: Secondary | ICD-10-CM | POA: Diagnosis not present

## 2019-08-01 DIAGNOSIS — Z6830 Body mass index (BMI) 30.0-30.9, adult: Secondary | ICD-10-CM | POA: Diagnosis not present

## 2019-08-01 DIAGNOSIS — M545 Low back pain: Secondary | ICD-10-CM | POA: Diagnosis not present

## 2019-08-01 DIAGNOSIS — M5106 Intervertebral disc disorders with myelopathy, lumbar region: Secondary | ICD-10-CM | POA: Diagnosis not present

## 2019-08-12 DIAGNOSIS — J301 Allergic rhinitis due to pollen: Secondary | ICD-10-CM | POA: Diagnosis not present

## 2019-08-12 DIAGNOSIS — J3089 Other allergic rhinitis: Secondary | ICD-10-CM | POA: Diagnosis not present

## 2019-08-17 DIAGNOSIS — J301 Allergic rhinitis due to pollen: Secondary | ICD-10-CM | POA: Diagnosis not present

## 2019-08-17 DIAGNOSIS — J3089 Other allergic rhinitis: Secondary | ICD-10-CM | POA: Diagnosis not present

## 2019-08-24 DIAGNOSIS — J3089 Other allergic rhinitis: Secondary | ICD-10-CM | POA: Diagnosis not present

## 2019-08-24 DIAGNOSIS — J301 Allergic rhinitis due to pollen: Secondary | ICD-10-CM | POA: Diagnosis not present

## 2019-08-25 ENCOUNTER — Other Ambulatory Visit: Payer: Self-pay

## 2019-08-25 MED ORDER — TRAZODONE HCL 50 MG PO TABS: ORAL_TABLET | ORAL | 0 refills | Status: DC

## 2019-08-25 NOTE — Telephone Encounter (Signed)
Refill request received via fax from [facility] for trazodone.   Last Visit: 05/31/2019 telemedicine  Next Visit: message sent to the front desk to schedule.   Last fill: 05/31/2019 (90 tabs)  Okay to refill trazodone?

## 2019-08-26 DIAGNOSIS — J3089 Other allergic rhinitis: Secondary | ICD-10-CM | POA: Diagnosis not present

## 2019-08-26 DIAGNOSIS — J301 Allergic rhinitis due to pollen: Secondary | ICD-10-CM | POA: Diagnosis not present

## 2019-08-30 DIAGNOSIS — J3089 Other allergic rhinitis: Secondary | ICD-10-CM | POA: Diagnosis not present

## 2019-08-30 DIAGNOSIS — J301 Allergic rhinitis due to pollen: Secondary | ICD-10-CM | POA: Diagnosis not present

## 2019-09-01 DIAGNOSIS — J3089 Other allergic rhinitis: Secondary | ICD-10-CM | POA: Diagnosis not present

## 2019-09-01 DIAGNOSIS — J301 Allergic rhinitis due to pollen: Secondary | ICD-10-CM | POA: Diagnosis not present

## 2019-09-06 DIAGNOSIS — J301 Allergic rhinitis due to pollen: Secondary | ICD-10-CM | POA: Diagnosis not present

## 2019-09-06 DIAGNOSIS — J3089 Other allergic rhinitis: Secondary | ICD-10-CM | POA: Diagnosis not present

## 2019-11-22 ENCOUNTER — Other Ambulatory Visit: Payer: Self-pay | Admitting: *Deleted

## 2019-11-22 MED ORDER — TRAZODONE HCL 50 MG PO TABS: ORAL_TABLET | ORAL | 0 refills | Status: DC

## 2019-11-22 NOTE — Telephone Encounter (Signed)
Please schedule patient for a follow up visit. Patient was due October 2020. Thanks!  

## 2019-11-22 NOTE — Telephone Encounter (Signed)
LMOM for patient to schedule follow-up appointment that was due in October 2020.

## 2019-11-22 NOTE — Telephone Encounter (Signed)
Refill request received via fax  Last Visit: 05/31/2019 telemedicine  Next Visit: message sent to the front desk to schedule.   Okay to refill Trazodone?

## 2020-01-17 ENCOUNTER — Other Ambulatory Visit: Payer: Self-pay | Admitting: Family Medicine

## 2020-01-17 DIAGNOSIS — N200 Calculus of kidney: Secondary | ICD-10-CM

## 2020-01-17 DIAGNOSIS — R109 Unspecified abdominal pain: Secondary | ICD-10-CM

## 2020-01-17 NOTE — Progress Notes (Signed)
Office Visit Note  Patient: [name]             Date of Birth: [birth year]           MRN: [id number]             PCP: Lewis Moccasin, MD Referring: Lewis Moccasin, MD Visit Date: 01/19/2020 Occupation: @GUAROCC @  Subjective:  Trapezius muscle spasms   History of Present Illness: [name] is a 64 y.o. female with history of fibromyalgia and DDD.  She is taking gabapentin 300 mg by mouth daily and hydrocodone as needed for pain relief.  She continues her trazodone 50 mg 1 tablet by mouth at bedtime along with melatonin 5 mg by mouth at bedtime.  She has no difficulty falling asleep but occasionally has difficulty staying asleep.  She states that her level of fatigue has been stable recently.  She presents today with trapezius muscle tension and muscle spasms bilaterally.  She requested trigger point injections.  She continues to have trochanter bursitis bilaterally and perform stretching exercises daily.  She will be following up with her pain management specialist and having injections next week.  She states that she experiences intermittent discomfort in both knee joints and has occasional swelling in the right knee which has been operated on 3 times in the past.  She continues to take gabapentin 300 mg daily as prescribed for neuropathy.  Gabapentin has been very effective at managing her symptoms.     Activities of Daily Living:  Patient reports morning stiffness for 15-20 minutes.   Patient Denies nocturnal pain.  Difficulty dressing/grooming: Denies Difficulty climbing stairs: Reports Difficulty getting out of chair: Reports Difficulty using hands for taps, buttons, cutlery, and/or writing: Denies  Review of Systems  Constitutional: Positive for fatigue.  HENT: Negative for mouth sores, mouth dryness and nose dryness.   Eyes: Negative for pain, itching, visual disturbance and dryness.  Respiratory: Negative for cough, hemoptysis, shortness of breath and  difficulty breathing.   Cardiovascular: Positive for swelling in legs/feet. Negative for chest pain, palpitations and hypertension.  Gastrointestinal: Negative for blood in stool, constipation and diarrhea.  Endocrine: Negative for increased urination.  Genitourinary: Negative for difficulty urinating and painful urination.  Musculoskeletal: Positive for arthralgias, joint pain, joint swelling, morning stiffness and muscle tenderness. Negative for myalgias, muscle weakness and myalgias.  Skin: Negative for color change, pallor, rash, hair loss, nodules/bumps, redness, skin tightness, ulcers and sensitivity to sunlight.  Allergic/Immunologic: Negative for susceptible to infections.  Neurological: Negative for dizziness, numbness, headaches, memory loss and weakness.  Hematological: Negative for swollen glands.  Psychiatric/Behavioral: Negative for depressed mood, confusion and sleep disturbance. The patient is not nervous/anxious.     PMFS History:  Patient Active Problem List   Diagnosis Date Noted  . Benign hypertension 08/02/2018  . Chronic back pain 08/02/2018  . Diabetes mellitus (HCC) 08/02/2018  . Hyperlipidemia 08/02/2018  . Plantar fasciitis 07/11/2018  . Acquired trigger finger 03/17/2018  . Primary osteoarthritis of both knees 11/16/2017  . DDD (degenerative disc disease), lumbar 11/16/2017  . History of asthma 11/16/2017  . History of gastroesophageal reflux (GERD) 11/16/2017  . History of hypertension 11/16/2017  . History of neuropathy 11/16/2017  . History of hypercholesterolemia 11/16/2017  . Mass of hand 11/09/2017  . Pain in right hand 11/09/2017  . Trigger finger 11/09/2017  . Fibromyalgia 07/26/2016  . Greater trochanteric bursitis of both hips 07/26/2016  . Chronic fatigue 07/26/2016  . Other insomnia 07/26/2016  .  Rectal bleeding 01/31/2014    Past Medical History:  Diagnosis Date  . Arthritis    RIGHT KNEE  . Asthma   . Chronic back pain   . DDD  (degenerative disc disease), lumbar   . Diabetes mellitus type 2, diet-controlled (HCC)   . Fibromyalgia   . GERD (gastroesophageal reflux disease)   . Hemorrhoids    INTERNAL AND EXTERNAL  . History of colon polyps    2009-- BENIGN  . Hyperlipidemia   . Hypertension   . PONV (postoperative nausea and vomiting)   . Right knee meniscal tear     Family History  Problem Relation Age of Onset  . Heart disease Mother   . Heart attack Mother   . Arthritis Father   . Diabetes Sister   . Diabetes Brother   . Hypertension Brother   . Stomach cancer Brother    Past Surgical History:  Procedure Laterality Date  . CARDIOVASCULAR STRESS TEST  07-06-2013   no ischemia or infarct/  normal LV function and wall motion , ef 63%  . COLONOSCOPY W/ POLYPECTOMY  01-10-2008  . KNEE ARTHROSCOPY Right 10-14-2006  . KNEE ARTHROSCOPY WITH LATERAL MENISECTOMY Right 05/18/2015   Procedure: KNEE ARTHROSCOPY WITH PARTIAL  LATERAL MENISECTOMY;  Surgeon: Durene Romans, MD;  Location: [facility];  Service: Orthopedics;  Laterality: Right;  . KNEE ARTHROSCOPY WITH MEDIAL MENISECTOMY Right 05/18/2015   Procedure: KNEE ARTHROSCOPY WITH PARTIAL  MEDIAL MENISECTOMY;  Surgeon: Durene Romans, MD;  Location: [facility];  Service: Orthopedics;  Laterality: Right;  . LIPOMA EXCISION Right 01/2018   right thumb lipoma   . PULLEY RELEASE RIGHT LONG AND SMALL FINGERS  01-30-2006  . RECONSTRUCTION OF ELBOW Bilateral right 06-03-2005  &  left  06-12-2011  . ROBOTIC ASSITED PARTIAL NEPHRECTOMY Left 10-16-2008   oncocytoma ( negative neoplasm)  . SHOULDER ARTHROSCOPY W/ SUBACROMIAL DECOMPRESSION AND DISTAL CLAVICLE EXCISION Left 07-07-2007   and ROTATOR CUFF REPAIR  . SIGMOIDOSCOPY  02-02-2014  . TOTAL ABDOMINAL HYSTERECTOMY W/ BILATERAL SALPINGOOPHORECTOMY  1979   Social History   Social History Narrative  . Not on file   Immunization History  Administered Date(s) Administered  .  Influenza,inj,Quad PF,6+ Mos 06/30/2018     Objective: Vital Signs: BP 140/75 (BP Location: Left Arm, Patient Position: Sitting, Cuff Size: Normal)   Pulse 72   Resp 15   Ht 5\' 6"  (1.676 m)   Wt 200 lb 6.4 oz (90.9 kg)   BMI 32.35 kg/m    Physical Exam Vitals and nursing note reviewed.  Constitutional:      Appearance: She is well-developed.  HENT:     Head: Normocephalic and atraumatic.  Eyes:     Conjunctiva/sclera: Conjunctivae normal.  Pulmonary:     Effort: Pulmonary effort is normal.  Abdominal:     General: Bowel sounds are normal.     Palpations: Abdomen is soft.  Musculoskeletal:     Cervical back: Normal range of motion.  Lymphadenopathy:     Cervical: No cervical adenopathy.  Skin:    General: Skin is warm and dry.     Capillary Refill: Capillary refill takes less than 2 seconds.  Neurological:     Mental Status: She is alert and oriented to person, place, and time.  Psychiatric:        Behavior: Behavior normal.      Musculoskeletal Exam: Generalized hyperalgesia and positive tender points.  C-spine has good range of motion with no discomfort.  She is trapezius muscle tension and muscle tenderness bilaterally.  Thoracic and lumbar spine good range of motion.  Shoulder joints, elbow joints, wrist joints, MCPs, PIPs and DIPs good range of motion no synovitis.  She has complete fist formation bilaterally.  Hip joints have good range of motion with no discomfort.  She has tenderness over bilateral trochanteric bursa.  Knee joints have good range of motion with no discomfort.  She is swelling of the right knee but no warmth or effusion was noted.  Left knee has no warmth or effusion.  Ankle joints have good range of motion with no tenderness.  She has pitting edema bilaterally.  CDAI Exam: CDAI Score: -- Patient Global: --; Provider Global: -- Swollen: --; Tender: -- Joint Exam 01/19/2020   No joint exam has been documented for this visit   There is currently  no information documented on the homunculus. Go to the Rheumatology activity and complete the homunculus joint exam.  Investigation: No additional findings.  Imaging: No results found.  Recent Labs: Lab Results  Component Value Date   WBC 8.4 05/08/2015   HGB 13.2 05/08/2015   PLT 164 05/08/2015   NA 138 05/08/2015   K 3.3 (L) 05/08/2015   CL 101 05/08/2015   CO2 28 05/08/2015   GLUCOSE 135 (H) 05/08/2015   BUN 13 05/08/2015   CREATININE 1.07 (H) 05/08/2015   BILITOT 0.3 06/28/2013   ALKPHOS 68 06/28/2013   AST 14 06/28/2013   ALT 11 06/28/2013   PROT 7.6 06/28/2013   ALBUMIN 3.8 06/28/2013   CALCIUM 9.5 05/08/2015   GFRAA >60 05/08/2015    Speciality Comments: No specialty comments available.  Procedures:  Trigger Point Inj  Date/Time: 01/19/2020 10:06 AM Performed by: Bo Merino, MD Authorized by: Bo Merino, MD   Consent Given by:  Patient Site marked: the procedure site was marked   Timeout: prior to procedure the correct patient, procedure, and site was verified   Indications:  Pain Total # of Trigger Points:  2 Location: neck   Needle Size:  27 G Approach:  Dorsal Medications #1:  10 mg triamcinolone acetonide 40 MG/ML; 0.5 mL lidocaine 1 % Medications #2:  10 mg triamcinolone acetonide 40 MG/ML; 0.5 mL lidocaine 1 % Patient tolerance:  Patient tolerated the procedure well with no immediate complications   Allergies: Macrobid [nitrofurantoin macrocrystal], Seasonal ic [cholestatin], Flexeril [cyclobenzaprine], Penicillins, Percocet [oxycodone-acetaminophen], and Zanaflex [tizanidine hcl]   Assessment / Plan:     Visit Diagnoses: Fibromyalgia: She has generalized hyperalgesia and positive tender points on exam.  She continues to have occasional fibromyalgia flares.  She presents today with trapezius muscle tension and muscle tenderness bilaterally.  Has been experiencing muscle spasms intermittently.  She requested trigger point injections  today.  She tolerated procedure well.  The procedure is completed above.  She continues to have trochanter bursitis bilaterally and is planning on having cortisone injections performed by her pain management specialist at her upcoming appointment.  She was encouraged to perform stretching exercises on a daily basis.  She continues to have chronic fatigue secondary to insomnia.  She takes trazodone 50 mg and melatonin 5 mg by mouth at bedtime to help her sleep.  We discussed the importance of regular exercise and good sleep hygiene.  She will follow-up in the office in 6 months.  Greater trochanteric bursitis of both hips: She has tenderness over bilateral trochanteric bursa.  She is planning on having cortisone injections performed by her pain management specialist  at her upcoming appointment.  She was encouraged to perform stretching exercises daily.  Trapezius muscle spasm: She presents today with trapezius muscle tension and muscle tenderness bilaterally.  She is been experiencing muscle spasms intermittently.  All symptoms are alleviated by massage.  She requested trigger point injections today with only cortisone and no lidocaine.  She tolerated the procedures well.  The procedure note was completed above.  Primary osteoarthritis of both knees: She experiences intermittent discomfort in both knee joints.  No warmth or effusion was noted.  She has swelling of the right knee joint but no effusion noted.  She has had 3 arthroscopic surgeries in the right knee joint in the past.  DDD (degenerative disc disease), lumbar: She is not having any discomfort in her lower back at this time.  She has no symptoms of radiculopathy.  Plantar fasciitis of left foot - Resolved   Trigger finger, right ring finger - Resolved. Injection by Dr. Amanda Pea 2 weeks ago.   Chronic fatigue: She has chronic fatigue secondary to insomnia.  Discussed the importance of regular exercise.  Other insomnia: She takes trazodone 50  mg 1 tablet by mouth at bedtime and melatonin 5 mg po at bedtime for insomnia.  She has no difficulty falling asleep does have difficulty staying asleep.  Discussed the importance of good sleep hygiene.  History of neuropathy: Her symptoms are well managed taking gabapentin 300 mg by mouth daily.  Pitting edema: She has pitting edema bilaterally.  She takes Lasix as prescribed.  Other medical conditions are listed as follows:  History of gastroesophageal reflux (GERD)  History of hypercholesterolemia  History of asthma  History of hypertension  Orders: Orders Placed This Encounter  Procedures  . Trigger Point Inj   No orders of the defined types were placed in this encounter.     Follow-Up Instructions: Return in about 6 months (around 07/20/2020) for Fibromyalgia, DDD.   Gearldine Bienenstock, PA-C  Note - This record has been created using Dragon software.  Chart creation errors have been sought, but may not always  have been located. Such creation errors do not reflect on  the standard of medical care.

## 2020-01-19 ENCOUNTER — Encounter: Payer: Self-pay | Admitting: Physician Assistant

## 2020-01-19 ENCOUNTER — Other Ambulatory Visit: Payer: Self-pay

## 2020-01-19 ENCOUNTER — Ambulatory Visit (INDEPENDENT_AMBULATORY_CARE_PROVIDER_SITE_OTHER): Payer: Medicare Other | Admitting: Physician Assistant

## 2020-01-19 VITALS — BP 140/75 | HR 72 | Resp 15 | Ht 66.0 in | Wt 200.4 lb

## 2020-01-19 DIAGNOSIS — M722 Plantar fascial fibromatosis: Secondary | ICD-10-CM

## 2020-01-19 DIAGNOSIS — G4709 Other insomnia: Secondary | ICD-10-CM

## 2020-01-19 DIAGNOSIS — R609 Edema, unspecified: Secondary | ICD-10-CM

## 2020-01-19 DIAGNOSIS — R5383 Other fatigue: Secondary | ICD-10-CM

## 2020-01-19 DIAGNOSIS — M170 Bilateral primary osteoarthritis of knee: Secondary | ICD-10-CM

## 2020-01-19 DIAGNOSIS — Z8709 Personal history of other diseases of the respiratory system: Secondary | ICD-10-CM

## 2020-01-19 DIAGNOSIS — M5136 Other intervertebral disc degeneration, lumbar region: Secondary | ICD-10-CM

## 2020-01-19 DIAGNOSIS — Z8719 Personal history of other diseases of the digestive system: Secondary | ICD-10-CM

## 2020-01-19 DIAGNOSIS — M62838 Other muscle spasm: Secondary | ICD-10-CM

## 2020-01-19 DIAGNOSIS — Z8639 Personal history of other endocrine, nutritional and metabolic disease: Secondary | ICD-10-CM

## 2020-01-19 DIAGNOSIS — M65341 Trigger finger, right ring finger: Secondary | ICD-10-CM

## 2020-01-19 DIAGNOSIS — Z8679 Personal history of other diseases of the circulatory system: Secondary | ICD-10-CM

## 2020-01-19 DIAGNOSIS — M797 Fibromyalgia: Secondary | ICD-10-CM

## 2020-01-19 DIAGNOSIS — M7061 Trochanteric bursitis, right hip: Secondary | ICD-10-CM | POA: Diagnosis not present

## 2020-01-19 DIAGNOSIS — Z8669 Personal history of other diseases of the nervous system and sense organs: Secondary | ICD-10-CM

## 2020-01-19 DIAGNOSIS — R5382 Chronic fatigue, unspecified: Secondary | ICD-10-CM

## 2020-01-19 DIAGNOSIS — M7062 Trochanteric bursitis, left hip: Secondary | ICD-10-CM

## 2020-01-19 MED ORDER — TRIAMCINOLONE ACETONIDE 40 MG/ML IJ SUSP
10.0000 mg | INTRAMUSCULAR | Status: AC | PRN
  Administered 2020-01-19: 10 mg via INTRAMUSCULAR

## 2020-01-19 MED ORDER — LIDOCAINE HCL 1 % IJ SOLN
0.5000 mL | INTRAMUSCULAR | Status: AC | PRN
  Administered 2020-01-19: .5 mL

## 2020-01-27 ENCOUNTER — Ambulatory Visit
Admission: RE | Admit: 2020-01-27 | Discharge: 2020-01-27 | Disposition: A | Discharge status: Home / Self Care | Payer: PRIVATE HEALTH INSURANCE | Source: Ambulatory Visit | Attending: Family Medicine | Admitting: Family Medicine

## 2020-01-27 DIAGNOSIS — R109 Unspecified abdominal pain: Secondary | ICD-10-CM

## 2020-01-27 DIAGNOSIS — N200 Calculus of kidney: Secondary | ICD-10-CM

## 2020-02-23 ENCOUNTER — Other Ambulatory Visit: Payer: Self-pay | Admitting: *Deleted

## 2020-02-23 MED ORDER — TRAZODONE HCL 50 MG PO TABS: ORAL_TABLET | ORAL | 0 refills | Status: DC

## 2020-02-23 NOTE — Telephone Encounter (Signed)
Refill request received via fax  Last Visit: 01/19/2020 Next Visit: 07/19/2020  Last Fill 11/22/2019  Okay to refill Trazodone?

## 2020-06-05 ENCOUNTER — Other Ambulatory Visit: Payer: Self-pay | Admitting: Rheumatology

## 2020-07-09 NOTE — Progress Notes (Deleted)
Office Visit Note  Patient: [name]             Date of Birth: 09/25/1955           MRN: [id number]             PCP: Lewis Moccasin, MD Referring: Lewis Moccasin, MD Visit Date: 07/23/2020 Occupation: @GUAROCC @  Subjective:  No chief complaint on file.   History of Present Illness: [name] is a 64 y.o. female ***   Activities of Daily Living:  Patient reports morning stiffness for *** {minute/hour:19697}.   Patient {ACTIONS;DENIES/REPORTS:21021675::"Denies"} nocturnal pain.  Difficulty dressing/grooming: {ACTIONS;DENIES/REPORTS:21021675::"Denies"} Difficulty climbing stairs: {ACTIONS;DENIES/REPORTS:21021675::"Denies"} Difficulty getting out of chair: {ACTIONS;DENIES/REPORTS:21021675::"Denies"} Difficulty using hands for taps, buttons, cutlery, and/or writing: {ACTIONS;DENIES/REPORTS:21021675::"Denies"}  No Rheumatology ROS completed.   PMFS History:  Patient Active Problem List   Diagnosis Date Noted   Benign hypertension 08/02/2018   Chronic back pain 08/02/2018   Diabetes mellitus (HCC) 08/02/2018   Hyperlipidemia 08/02/2018   Plantar fasciitis 07/11/2018   Acquired trigger finger 03/17/2018   Primary osteoarthritis of both knees 11/16/2017   DDD (degenerative disc disease), lumbar 11/16/2017   History of asthma 11/16/2017   History of gastroesophageal reflux (GERD) 11/16/2017   History of hypertension 11/16/2017   History of neuropathy 11/16/2017   History of hypercholesterolemia 11/16/2017   Mass of hand 11/09/2017   Pain in right hand 11/09/2017   Trigger finger 11/09/2017   Fibromyalgia 07/26/2016   Greater trochanteric bursitis of both hips 07/26/2016   Chronic fatigue 07/26/2016   Other insomnia 07/26/2016   Rectal bleeding 01/31/2014    Past Medical History:  Diagnosis Date   Arthritis    RIGHT KNEE   Asthma    Chronic back pain    DDD (degenerative disc disease), lumbar    Diabetes mellitus type 2,  diet-controlled (HCC)    Fibromyalgia    GERD (gastroesophageal reflux disease)    Hemorrhoids    INTERNAL AND EXTERNAL   History of colon polyps    2009-- BENIGN   Hyperlipidemia    Hypertension    PONV (postoperative nausea and vomiting)    Right knee meniscal tear     Family History  Problem Relation Age of Onset   Heart disease Mother    Heart attack Mother    Arthritis Father    Diabetes Sister    Diabetes Brother    Hypertension Brother    Stomach cancer Brother    Past Surgical History:  Procedure Laterality Date   CARDIOVASCULAR STRESS TEST  07-06-2013   no ischemia or infarct/  normal LV function and wall motion , ef 63%   COLONOSCOPY W/ POLYPECTOMY  01-10-2008   KNEE ARTHROSCOPY Right 10-14-2006   KNEE ARTHROSCOPY WITH LATERAL MENISECTOMY Right 05/18/2015   Procedure: KNEE ARTHROSCOPY WITH PARTIAL  LATERAL MENISECTOMY;  Surgeon: 05/20/2015, MD;  Location: [facility];  Service: Orthopedics;  Laterality: Right;   KNEE ARTHROSCOPY WITH MEDIAL MENISECTOMY Right 05/18/2015   Procedure: KNEE ARTHROSCOPY WITH PARTIAL  MEDIAL MENISECTOMY;  Surgeon: 05/20/2015, MD;  Location: [facility];  Service: Orthopedics;  Laterality: Right;   LIPOMA EXCISION Right 01/2018   right thumb lipoma    PULLEY RELEASE RIGHT LONG AND SMALL FINGERS  01-30-2006   RECONSTRUCTION OF ELBOW Bilateral right 06-03-2005  &  left  06-12-2011   ROBOTIC ASSITED PARTIAL NEPHRECTOMY Left 10-16-2008   oncocytoma ( negative neoplasm)   SHOULDER ARTHROSCOPY W/ SUBACROMIAL DECOMPRESSION AND  DISTAL CLAVICLE EXCISION Left 07-07-2007   and ROTATOR CUFF REPAIR   SIGMOIDOSCOPY  02-02-2014   TOTAL ABDOMINAL HYSTERECTOMY W/ BILATERAL SALPINGOOPHORECTOMY  1979   Social History   Social History Narrative   Not on file   Immunization History  Administered Date(s) Administered   Influenza,inj,Quad PF,6+ Mos 06/30/2018     Objective: Vital Signs:  There were no vitals taken for this visit.   Physical Exam   Musculoskeletal Exam: ***  CDAI Exam: CDAI Score: -- Patient Global: --; Provider Global: -- Swollen: --; Tender: -- Joint Exam 07/23/2020   No joint exam has been documented for this visit   There is currently no information documented on the homunculus. Go to the Rheumatology activity and complete the homunculus joint exam.  Investigation: No additional findings.  Imaging: No results found.  Recent Labs: Lab Results  Component Value Date   WBC 8.4 05/08/2015   HGB 13.2 05/08/2015   PLT 164 05/08/2015   NA 138 05/08/2015   K 3.3 (L) 05/08/2015   CL 101 05/08/2015   CO2 28 05/08/2015   GLUCOSE 135 (H) 05/08/2015   BUN 13 05/08/2015   CREATININE 1.07 (H) 05/08/2015   BILITOT 0.3 06/28/2013   ALKPHOS 68 06/28/2013   AST 14 06/28/2013   ALT 11 06/28/2013   PROT 7.6 06/28/2013   ALBUMIN 3.8 06/28/2013   CALCIUM 9.5 05/08/2015   GFRAA >60 05/08/2015    Speciality Comments: No specialty comments available.  Procedures:  No procedures performed Allergies: Macrobid [nitrofurantoin macrocrystal], Seasonal ic [cholestatin], Flexeril [cyclobenzaprine], Penicillins, Percocet [oxycodone-acetaminophen], and Zanaflex [tizanidine hcl]   Assessment / Plan:     Visit Diagnoses: No diagnosis found.  Orders: No orders of the defined types were placed in this encounter.  No orders of the defined types were placed in this encounter.   Face-to-face time spent with patient was *** minutes. Greater than 50% of time was spent in counseling and coordination of care.  Follow-Up Instructions: No follow-ups on file.   Ellen Henri, CMA  Note - This record has been created using Animal nutritionist.  Chart creation errors have been sought, but may not always  have been located. Such creation errors do not reflect on  the standard of medical care.

## 2020-07-18 ENCOUNTER — Telehealth: Payer: Self-pay

## 2020-07-18 NOTE — Telephone Encounter (Signed)
Patient needs to change her appointment on Monday, 07/23/20 at 11:15 from an in-person appointment to a virtual.  Please advise

## 2020-07-19 ENCOUNTER — Ambulatory Visit: Payer: Medicare Other | Admitting: Physician Assistant

## 2020-07-19 NOTE — Telephone Encounter (Signed)
Okay to schedule virtual visit.

## 2020-07-20 NOTE — Telephone Encounter (Signed)
LMOM for patient to call and schedule virtual appointment either 08/02/20 or 08/09/20 at 4:00 pm.

## 2020-07-23 ENCOUNTER — Ambulatory Visit: Payer: Medicare Other | Admitting: Rheumatology

## 2020-07-23 DIAGNOSIS — R609 Edema, unspecified: Secondary | ICD-10-CM

## 2020-07-23 DIAGNOSIS — Z8709 Personal history of other diseases of the respiratory system: Secondary | ICD-10-CM

## 2020-07-23 DIAGNOSIS — Z8669 Personal history of other diseases of the nervous system and sense organs: Secondary | ICD-10-CM

## 2020-07-23 DIAGNOSIS — M5136 Other intervertebral disc degeneration, lumbar region: Secondary | ICD-10-CM

## 2020-07-23 DIAGNOSIS — Z8639 Personal history of other endocrine, nutritional and metabolic disease: Secondary | ICD-10-CM

## 2020-07-23 DIAGNOSIS — M797 Fibromyalgia: Secondary | ICD-10-CM

## 2020-07-23 DIAGNOSIS — M7061 Trochanteric bursitis, right hip: Secondary | ICD-10-CM

## 2020-07-23 DIAGNOSIS — M722 Plantar fascial fibromatosis: Secondary | ICD-10-CM

## 2020-07-23 DIAGNOSIS — M65341 Trigger finger, right ring finger: Secondary | ICD-10-CM

## 2020-07-23 DIAGNOSIS — R5382 Chronic fatigue, unspecified: Secondary | ICD-10-CM

## 2020-07-23 DIAGNOSIS — G4709 Other insomnia: Secondary | ICD-10-CM

## 2020-07-23 DIAGNOSIS — M170 Bilateral primary osteoarthritis of knee: Secondary | ICD-10-CM

## 2020-07-23 DIAGNOSIS — Z8719 Personal history of other diseases of the digestive system: Secondary | ICD-10-CM

## 2020-07-23 DIAGNOSIS — M62838 Other muscle spasm: Secondary | ICD-10-CM

## 2020-07-23 DIAGNOSIS — Z8679 Personal history of other diseases of the circulatory system: Secondary | ICD-10-CM

## 2020-08-09 ENCOUNTER — Telehealth: Payer: Medicare Other | Admitting: Rheumatology

## 2020-09-06 NOTE — Progress Notes (Signed)
DUE TO COVID-19 ONLY ONE VISITOR IS ALLOWED TO COME WITH YOU AND STAY IN THE WAITING ROOM ONLY DURING PRE OP AND PROCEDURE DAY OF SURGERY. THE 1 VISITOR  MAY VISIT WITH YOU AFTER SURGERY IN YOUR PRIVATE ROOM DURING VISITING HOURS ONLY!  YOU NEED TO HAVE A COVID 19 TEST ON__12/27/2021 _____ @_______ , THIS TEST MUST BE DONE BEFORE SURGERY,  COVID TESTING SITE [address] [place] , IT IS ON THE RIGHT GOING OUT [address] APPROXIMATELY  2 MINUTES PAST [employer] ON THE RIGHT. ONCE YOUR COVID TEST IS COMPLETED,  PLEASE BEGIN THE QUARANTINE INSTRUCTIONS AS OUTLINED IN YOUR HANDOUT.                [name]  09/06/2020   Your procedure is scheduled on: 09/18/2020    Report to [facility] Main  Entrance   Report to admitting at0610am         Call this number if you have problems the morning of surgery [phone]    REMEMBER: NO  SOLID FOOD CANDY OR GUM AFTER MIDNIGHT. CLEAR LIQUIDS UNTIL  0530am        . NOTHING BY MOUTH EXCEPT CLEAR LIQUIDS UNTIL    . PLEASE FINISH ENSURE DRINK PER SURGEON ORDER  WHICH NEEDS TO BE COMPLETED AT   0530am    .      CLEAR LIQUID DIET   Foods Allowed                                                                    Coffee and tea, regular and decaf                            Fruit ices (not with fruit pulp)                                      Iced Popsicles                                    Carbonated beverages, regular and diet                                    Cranberry, grape and apple juices Sports drinks like Gatorade Lightly seasoned clear broth or consume(fat free) Sugar, honey syrup ___________________________________________________________________      BRUSH YOUR TEETH MORNING OF SURGERY AND RINSE YOUR MOUTH OUT, NO CHEWING GUM CANDY OR MINTS.     Take these medicines the morning of surgery with A SIP OF WATER: protonix, inhalers as usual and bring   DO NOT TAKE ANY DIABETIC  MEDICATIONS DAY OF YOUR SURGERY                               You may not have any metal on your body including hair pins and  piercings  Do not wear jewelry, make-up, lotions, powders or perfumes, deodorant             Do not wear nail polish on your fingernails.  Do not shave  48 hours prior to surgery.              Men may shave face and neck.   Do not bring valuables to the hospital. [place].  Contacts, dentures or bridgework may not be worn into surgery.  Leave suitcase in the car. After surgery it may be brought to your room.     Patients discharged the day of surgery will not be allowed to drive home. IF YOU ARE HAVING SURGERY AND GOING HOME THE SAME DAY, YOU MUST HAVE AN ADULT TO DRIVE YOU HOME AND BE WITH YOU FOR 24 HOURS. YOU MAY GO HOME BY TAXI OR UBER OR ORTHERWISE, BUT AN ADULT MUST ACCOMPANY YOU HOME AND STAY WITH YOU FOR 24 HOURS.  Name and phone number of your driver:  Special Instructions: N/A              Please read over the following fact sheets you were given: _____________________________________________________________________  [facility] - Preparing for Surgery Before surgery, you can play an important role.  Because skin is not sterile, your skin needs to be as free of germs as possible.  You can reduce the number of germs on your skin by washing with CHG (chlorahexidine gluconate) soap before surgery.  CHG is an antiseptic cleaner which kills germs and bonds with the skin to continue killing germs even after washing. Please DO NOT use if you have an allergy to CHG or antibacterial soaps.  If your skin becomes reddened/irritated stop using the CHG and inform your nurse when you arrive at Short Stay. Do not shave (including legs and underarms) for at least 48 hours prior to the first CHG shower.  You may shave your face/neck. Please follow these instructions carefully:  1.  Shower with CHG Soap the night  before surgery and the  morning of Surgery.  2.  If you choose to wash your hair, wash your hair first as usual with your  normal  shampoo.  3.  After you shampoo, rinse your hair and body thoroughly to remove the  shampoo.                           4.  Use CHG as you would any other liquid soap.  You can apply chg directly  to the skin and wash                       Gently with a scrungie or clean washcloth.  5.  Apply the CHG Soap to your body ONLY FROM THE NECK DOWN.   Do not use on face/ open                           Wound or open sores. Avoid contact with eyes, ears mouth and genitals (private parts).                       Wash face,  Genitals (private parts) with your normal soap.             6.  Wash thoroughly, paying special attention to the area where your surgery  will be performed.  7.  Thoroughly rinse your body with warm water from the neck down.  8.  DO NOT shower/wash with your normal soap after using and rinsing off  the CHG Soap.                9.  Pat yourself dry with a clean towel.            10.  Wear clean pajamas.            11.  Place clean sheets on your bed the night of your first shower and do not  sleep with pets. Day of Surgery : Do not apply any lotions/deodorants the morning of surgery.  Please wear clean clothes to the hospital/surgery center.  FAILURE TO FOLLOW THESE INSTRUCTIONS MAY RESULT IN THE CANCELLATION OF YOUR SURGERY PATIENT SIGNATURE_________________________________  NURSE SIGNATURE__________________________________  ________________________________________________________________________

## 2020-09-07 ENCOUNTER — Other Ambulatory Visit: Payer: Self-pay

## 2020-09-07 ENCOUNTER — Encounter (HOSPITAL_COMMUNITY): Payer: Self-pay

## 2020-09-07 ENCOUNTER — Encounter (HOSPITAL_COMMUNITY)
Admission: RE | Admit: 2020-09-07 | Discharge: 2020-09-07 | Disposition: A | Discharge status: Home / Self Care | Payer: Medicare Other | Source: Ambulatory Visit | Attending: Orthopedic Surgery | Admitting: Orthopedic Surgery

## 2020-09-07 DIAGNOSIS — Z01818 Encounter for other preprocedural examination: Secondary | ICD-10-CM | POA: Insufficient documentation

## 2020-09-07 LAB — SURGICAL PCR SCREEN
MRSA, PCR: NEGATIVE
Staphylococcus aureus: NEGATIVE

## 2020-09-07 LAB — TYPE AND SCREEN
ABO/RH(D): O POS
Antibody Screen: NEGATIVE

## 2020-09-07 LAB — CBC
HCT: 37.3 % (ref 36.0–46.0)
Hemoglobin: 12.1 g/dL (ref 12.0–15.0)
MCH: 30.8 pg (ref 26.0–34.0)
MCHC: 32.4 g/dL (ref 30.0–36.0)
MCV: 94.9 fL (ref 80.0–100.0)
Platelets: 194 10*3/uL (ref 150–400)
RBC: 3.93 MIL/uL (ref 3.87–5.11)
RDW: 11.8 % (ref 11.5–15.5)
WBC: 6.9 10*3/uL (ref 4.0–10.5)
nRBC: 0 % (ref 0.0–0.2)

## 2020-09-07 NOTE — Progress Notes (Addendum)
3ewx\nesthesia Review:  PCP: DR Maryelizabeth Rowan -LOV 09/06/2020 on chart along with labs of CMP, PT, A1C and lipids.  Pt states she saw for clearance for surgery.  Cardiologist : Chest x-ray : EKG :09/07/2020  Echo : Stress test: Cardiac Cath :  Activity level: can do a flight of stairs without difficulty  Sleep Study/ CPAP :no  Fasting Blood Sugar :      / Checks Blood Sugar -- times a day:   Blood Thinner/ Instructions /Last Dose: ASA / Instructions/ Last Dose :

## 2020-09-17 ENCOUNTER — Other Ambulatory Visit (HOSPITAL_COMMUNITY)
Admission: RE | Admit: 2020-09-17 | Discharge: 2020-09-17 | Disposition: A | Discharge status: Home / Self Care | Payer: Medicare Other | Source: Ambulatory Visit | Attending: Orthopedic Surgery | Admitting: Orthopedic Surgery

## 2020-09-17 DIAGNOSIS — Z20822 Contact with and (suspected) exposure to covid-19: Secondary | ICD-10-CM | POA: Diagnosis not present

## 2020-09-17 DIAGNOSIS — Z01812 Encounter for preprocedural laboratory examination: Secondary | ICD-10-CM | POA: Diagnosis present

## 2020-09-17 LAB — SARS CORONAVIRUS 2 (TAT 6-24 HRS): SARS Coronavirus 2: NEGATIVE

## 2020-09-17 NOTE — H&P (Signed)
TOTAL KNEE ADMISSION H&P  Patient is being admitted for right total knee arthroplasty.  Subjective:  Chief Complaint:  Right knee OA / pain.  HPI: Beatris Belen Restivo, 64 y.o. female, has a history of pain and functional disability in the right knee due to arthritis and has failed non-surgical conservative treatments for greater than 12 weeks to include NSAID's and/or analgesics, corticosteriod injections, use of assistive devices and activity modification.  Onset of symptoms was gradual, starting < 1 year ago with rapidlly worsening course since that time. The patient noted prior procedures on the knee to include  arthroscopy on the right knee.  Patient currently rates pain in the right knee at 10 out of 10 with activity. Patient has night pain, worsening of pain with activity and weight bearing, pain that interferes with activities of daily living, pain with passive range of motion, crepitus and joint swelling.  Patient has evidence of periarticular osteophytes and joint space narrowing by imaging studies.  There is no active infection.  Risks, benefits and expectations were discussed with the patient.  Risks including but not limited to the risk of anesthesia, blood clots, nerve damage, blood vessel damage, failure of the prosthesis, infection and up to and including death.  Patient understand the risks, benefits and expectations and wishes to proceed with surgery.   D/C Plans:       Home   Post-op Meds:       No Rx given  Tranexamic Acid:      To be given - IV   Decadron:      Is to be given  FYI:      ASA  Dilaudid  DME:   Pt already has equipment  PT:   OPPT   Pharmacy: [facility] - [place]     Patient Active Problem List   Diagnosis Date Noted  . Benign hypertension 08/02/2018  . Chronic back pain 08/02/2018  . Diabetes mellitus (HCC) 08/02/2018  . Hyperlipidemia 08/02/2018  . Plantar fasciitis 07/11/2018  . Acquired trigger finger 03/17/2018  . Primary osteoarthritis of  both knees 11/16/2017  . DDD (degenerative disc disease), lumbar 11/16/2017  . History of asthma 11/16/2017  . History of gastroesophageal reflux (GERD) 11/16/2017  . History of hypertension 11/16/2017  . History of neuropathy 11/16/2017  . History of hypercholesterolemia 11/16/2017  . Mass of hand 11/09/2017  . Pain in right hand 11/09/2017  . Trigger finger 11/09/2017  . Fibromyalgia 07/26/2016  . Greater trochanteric bursitis of both hips 07/26/2016  . Chronic fatigue 07/26/2016  . Other insomnia 07/26/2016  . Rectal bleeding 01/31/2014   Past Medical History:  Diagnosis Date  . Arthritis    RIGHT KNEE  . Asthma   . Chronic back pain   . DDD (degenerative disc disease), lumbar   . Diabetes mellitus type 2, diet-controlled (HCC)    pt denies   . Fibromyalgia   . GERD (gastroesophageal reflux disease)   . Hemorrhoids    INTERNAL AND EXTERNAL  . History of colon polyps    2009-- BENIGN  . Hyperlipidemia   . Hypertension   . PONV (postoperative nausea and vomiting)   . Right knee meniscal tear     Past Surgical History:  Procedure Laterality Date  . CARDIOVASCULAR STRESS TEST  07-06-2013   no ischemia or infarct/  normal LV function and wall motion , ef 63%  . COLONOSCOPY W/ POLYPECTOMY  01-10-2008  . KNEE ARTHROSCOPY Right 10-14-2006  . KNEE ARTHROSCOPY WITH LATERAL MENISECTOMY Right  05/18/2015   Procedure: KNEE ARTHROSCOPY WITH PARTIAL  LATERAL MENISECTOMY;  Surgeon: Durene Romans, MD;  Location: [facility];  Service: Orthopedics;  Laterality: Right;  . KNEE ARTHROSCOPY WITH MEDIAL MENISECTOMY Right 05/18/2015   Procedure: KNEE ARTHROSCOPY WITH PARTIAL  MEDIAL MENISECTOMY;  Surgeon: Durene Romans, MD;  Location: [facility]  Service: Orthopedics;  Laterality: Right;  . LIPOMA EXCISION Right 01/2018   right thumb lipoma   . PULLEY RELEASE RIGHT LONG AND SMALL FINGERS  01-30-2006  . RECONSTRUCTION OF ELBOW Bilateral right 06-03-2005  &  left   06-12-2011  . ROBOTIC ASSITED PARTIAL NEPHRECTOMY Left 10-16-2008   oncocytoma ( negative neoplasm)  . SHOULDER ARTHROSCOPY W/ SUBACROMIAL DECOMPRESSION AND DISTAL CLAVICLE EXCISION Left 07-07-2007   and ROTATOR CUFF REPAIR  . SIGMOIDOSCOPY  02-02-2014  . TOTAL ABDOMINAL HYSTERECTOMY W/ BILATERAL SALPINGOOPHORECTOMY  1979    Current Facility-Administered Medications  Medication Dose Route Frequency Provider Last Rate Last Admin  . betamethasone acetate-betamethasone sodium phosphate (CELESTONE) injection 3 mg  3 mg Intramuscular Once Felecia Shelling, DPM       Current Outpatient Medications  Medication Sig Dispense Refill Last Dose  . albuterol (VENTOLIN HFA) 108 (90 Base) MCG/ACT inhaler Inhale 2 puffs into the lungs every 6 (six) hours as needed for wheezing or shortness of breath.     Marland Kitchen atorvastatin (LIPITOR) 40 MG tablet Take 40 mg by mouth every evening.      . budesonide-formoterol (SYMBICORT) 80-4.5 MCG/ACT inhaler Inhale 2 puffs into the lungs 2 (two) times daily as needed (Asthma).     . Calcium Carb-Cholecalciferol (CALCIUM 600/VITAMIN D3 PO) Take 1 tablet by mouth daily.     . Cholecalciferol (VITAMIN D3) 125 MCG (5000 UT) CAPS Take 5,000 Units by mouth daily.     Marland Kitchen gabapentin (NEURONTIN) 300 MG capsule Take 300 mg by mouth at bedtime.     . hydrochlorothiazide (HYDRODIURIL) 25 MG tablet Take 25 mg by mouth daily.     Marland Kitchen HYDROcodone-acetaminophen (NORCO) 7.5-325 MG tablet Take 1 tablet by mouth every 6 (six) hours as needed for moderate pain.     Marland Kitchen lisinopril (PRINIVIL,ZESTRIL) 40 MG tablet Take 40 mg by mouth daily.  0   . Melatonin 10 MG TABS Take 10 mg by mouth at bedtime.     . Multiple Vitamins-Minerals (MULTIVITAMIN PO) Take 1 tablet by mouth daily.     . pantoprazole (PROTONIX) 40 MG tablet Take 40 mg by mouth 2 (two) times daily before a meal.     . traZODone (DESYREL) 50 MG tablet TAKE 1 TABLET(50 MG) BY MOUTH AT BEDTIME AS NEEDED (Patient taking differently: Take 50 mg  by mouth at bedtime. TAKE 1 TABLET(50 MG) BY MOUTH AT BEDTIME AS NEEDED) 90 tablet 0    Allergies  Allergen Reactions  . Macrobid [Nitrofurantoin Macrocrystal]     GI upset   . Seasonal Ic [Cholestatin]   . Flexeril [Cyclobenzaprine] Other (See Comments)    "out of it "  . Penicillins Diarrhea  . Percocet [Oxycodone-Acetaminophen] Nausea And Vomiting  . Zanaflex [Tizanidine Hcl] Other (See Comments)    hallucinations     Social History   Tobacco Use  . Smoking status: Former Games developer  . Smokeless tobacco: Never Used  Substance Use Topics  . Alcohol use: Yes    Comment: rarely- 1 or 2 times yearly     Family History  Problem Relation Age of Onset  . Heart disease Mother   . Heart  attack Mother   . Arthritis Father   . Diabetes Sister   . Diabetes Brother   . Hypertension Brother   . Stomach cancer Brother      Review of Systems  Constitutional: Negative.   HENT: Negative.   Eyes: Negative.   Respiratory: Negative.   Cardiovascular: Negative.   Gastrointestinal: Negative.   Genitourinary: Negative.   Musculoskeletal: Positive for back pain and joint pain.  Skin: Negative.   Neurological: Negative.   Endo/Heme/Allergies: Negative.   Psychiatric/Behavioral: Negative.      Objective:  Physical Exam Constitutional:      Appearance: She is well-developed.  HENT:     Head: Normocephalic.  Eyes:     Pupils: Pupils are equal, round, and reactive to light.  Neck:     Thyroid: No thyromegaly.     Vascular: No JVD.     Trachea: No tracheal deviation.  Cardiovascular:     Rate and Rhythm: Normal rate and regular rhythm.     Pulses: Intact distal pulses.  Pulmonary:     Effort: Pulmonary effort is normal. No respiratory distress.     Breath sounds: Normal breath sounds. No wheezing.  Abdominal:     Palpations: Abdomen is soft.     Tenderness: There is no abdominal tenderness. There is no guarding.  Musculoskeletal:     Cervical back: Neck supple.     Right  knee: Swelling and bony tenderness present. No erythema or ecchymosis. Decreased range of motion. Tenderness present.  Lymphadenopathy:     Cervical: No cervical adenopathy.  Skin:    General: Skin is warm and dry.  Neurological:     Mental Status: She is alert and oriented to person, place, and time.  Psychiatric:        Mood and Affect: Mood and affect normal.       Labs:  Estimated body mass index is 31.64 kg/m as calculated from the following:   Height as of 09/07/20: 5\' 6"  (1.676 m).   Weight as of 09/07/20: 88.9 kg.   Imaging Review Plain radiographs demonstrate severe degenerative joint disease of the right knee(s). The bone quality appears to be good for age and reported activity level.      Assessment/Plan:  End stage arthritis, right knee   The patient history, physical examination, clinical judgment of the provider and imaging studies are consistent with end stage degenerative joint disease of the right knee(s) and total knee arthroplasty is deemed medically necessary. The treatment options including medical management, injection therapy arthroscopy and arthroplasty were discussed at length. The risks and benefits of total knee arthroplasty were presented and reviewed. The risks due to aseptic loosening, infection, stiffness, patella tracking problems, thromboembolic complications and other imponderables were discussed. The patient acknowledged the explanation, agreed to proceed with the plan and consent was signed. Patient is being admitted for treatment for surgery, pain control, PT, OT, prophylactic antibiotics, VTE prophylaxis, progressive ambulation and ADL's and discharge planning. The patient is planning to be discharged home.     Patient's anticipated LOS is less than 2 midnights, meeting these requirements: - Lives within 1 hour of care - Has a competent adult at home to recover with post-op recover - NO history of  - Chronic pain requiring opiods  -  Coronary Artery Disease  - Heart failure  - Heart attack  - Stroke  - DVT/VTE  - Cardiac arrhythmia  - Respiratory Failure/COPD  - Renal failure  - Anemia  - Advanced Liver disease

## 2020-09-18 ENCOUNTER — Ambulatory Visit (HOSPITAL_COMMUNITY): Payer: Medicare Other | Admitting: Physician Assistant

## 2020-09-18 ENCOUNTER — Ambulatory Visit (HOSPITAL_COMMUNITY): Payer: Medicare Other | Admitting: Anesthesiology

## 2020-09-18 ENCOUNTER — Encounter (HOSPITAL_COMMUNITY): Payer: Self-pay | Admitting: Orthopedic Surgery

## 2020-09-18 ENCOUNTER — Observation Stay (HOSPITAL_COMMUNITY)
Admission: RE | Admit: 2020-09-18 | Discharge: 2020-09-19 | Disposition: A | Discharge status: Home / Self Care | Payer: Medicare Other | Attending: Orthopedic Surgery | Admitting: Orthopedic Surgery

## 2020-09-18 ENCOUNTER — Other Ambulatory Visit: Payer: Self-pay

## 2020-09-18 ENCOUNTER — Encounter (HOSPITAL_COMMUNITY)
Admission: RE | Disposition: A | Discharge status: Home / Self Care | Payer: Self-pay | Source: Home / Self Care | Attending: Orthopedic Surgery

## 2020-09-18 DIAGNOSIS — Z86010 Personal history of colonic polyps: Secondary | ICD-10-CM | POA: Diagnosis not present

## 2020-09-18 DIAGNOSIS — I10 Essential (primary) hypertension: Secondary | ICD-10-CM | POA: Insufficient documentation

## 2020-09-18 DIAGNOSIS — E119 Type 2 diabetes mellitus without complications: Secondary | ICD-10-CM | POA: Insufficient documentation

## 2020-09-18 DIAGNOSIS — Z96651 Presence of right artificial knee joint: Secondary | ICD-10-CM

## 2020-09-18 DIAGNOSIS — M25561 Pain in right knee: Secondary | ICD-10-CM | POA: Diagnosis present

## 2020-09-18 DIAGNOSIS — J45909 Unspecified asthma, uncomplicated: Secondary | ICD-10-CM | POA: Insufficient documentation

## 2020-09-18 DIAGNOSIS — Z87891 Personal history of nicotine dependence: Secondary | ICD-10-CM | POA: Insufficient documentation

## 2020-09-18 DIAGNOSIS — Z79899 Other long term (current) drug therapy: Secondary | ICD-10-CM | POA: Insufficient documentation

## 2020-09-18 DIAGNOSIS — M1711 Unilateral primary osteoarthritis, right knee: Secondary | ICD-10-CM | POA: Diagnosis not present

## 2020-09-18 HISTORY — PX: TOTAL KNEE ARTHROPLASTY: SHX125

## 2020-09-18 LAB — BASIC METABOLIC PANEL
Anion gap: 17 — ABNORMAL HIGH (ref 5–15)
BUN: 18 mg/dL (ref 8–23)
CO2: 20 mmol/L — ABNORMAL LOW (ref 22–32)
Calcium: 9.5 mg/dL (ref 8.9–10.3)
Chloride: 102 mmol/L (ref 98–111)
Creatinine, Ser: 1.2 mg/dL — ABNORMAL HIGH (ref 0.44–1.00)
GFR, Estimated: 50 mL/min — ABNORMAL LOW (ref >60)
Glucose, Bld: 141 mg/dL — ABNORMAL HIGH (ref 70–99)
Potassium: 3.8 mmol/L (ref 3.5–5.1)
Sodium: 139 mmol/L (ref 135–145)

## 2020-09-18 SURGERY — ARTHROPLASTY, KNEE, TOTAL
Anesthesia: Spinal | Site: Knee | Laterality: Right

## 2020-09-18 MED ORDER — PANTOPRAZOLE SODIUM 40 MG PO TBEC
40.0000 mg | DELAYED_RELEASE_TABLET | Freq: Two times a day (BID) | ORAL | Status: DC
  Administered 2020-09-18 – 2020-09-19 (×2): 40 mg via ORAL
  Filled 2020-09-18 (×2): qty 1

## 2020-09-18 MED ORDER — DIPHENHYDRAMINE HCL 12.5 MG/5ML PO ELIX
12.5000 mg | ORAL_SOLUTION | ORAL | Status: DC | PRN
Start: 2020-09-18 — End: 2020-09-19

## 2020-09-18 MED ORDER — MIDAZOLAM HCL 2 MG/2ML IJ SOLN: 0.5000 mg | Freq: Once | INTRAMUSCULAR | Status: DC | PRN

## 2020-09-18 MED ORDER — SODIUM CHLORIDE 0.9 % IV SOLN: INTRAVENOUS | Status: DC

## 2020-09-18 MED ORDER — BISACODYL 10 MG RE SUPP: 10.0000 mg | Freq: Every day | RECTAL | Status: DC | PRN

## 2020-09-18 MED ORDER — MENTHOL 3 MG MT LOZG: 1.0000 | LOZENGE | OROMUCOSAL | Status: DC | PRN

## 2020-09-18 MED ORDER — MIDAZOLAM HCL 2 MG/2ML IJ SOLN
1.0000 mg | INTRAMUSCULAR | Status: DC
  Administered 2020-09-18: 1 mg via INTRAVENOUS
  Filled 2020-09-18: qty 2

## 2020-09-18 MED ORDER — DEXAMETHASONE SODIUM PHOSPHATE 10 MG/ML IJ SOLN
INTRAMUSCULAR | Status: AC
  Filled 2020-09-18: qty 1

## 2020-09-18 MED ORDER — CEFAZOLIN SODIUM-DEXTROSE 2-4 GM/100ML-% IV SOLN
2.0000 g | INTRAVENOUS | Status: AC
  Administered 2020-09-18: 2 g via INTRAVENOUS
  Filled 2020-09-18: qty 100

## 2020-09-18 MED ORDER — MIDAZOLAM HCL 5 MG/5ML IJ SOLN
INTRAMUSCULAR | Status: DC | PRN
  Administered 2020-09-18: 1 mg via INTRAVENOUS

## 2020-09-18 MED ORDER — ALUM & MAG HYDROXIDE-SIMETH 200-200-20 MG/5ML PO SUSP: 15.0000 mL | ORAL | Status: DC | PRN

## 2020-09-18 MED ORDER — FENTANYL CITRATE (PF) 100 MCG/2ML IJ SOLN
INTRAMUSCULAR | Status: AC
  Filled 2020-09-18: qty 2

## 2020-09-18 MED ORDER — MEPERIDINE HCL 50 MG/ML IJ SOLN
6.2500 mg | INTRAMUSCULAR | Status: DC | PRN
Start: 2020-09-18 — End: 2020-09-18

## 2020-09-18 MED ORDER — MELATONIN 5 MG PO TABS
10.0000 mg | ORAL_TABLET | Freq: Every day | ORAL | Status: DC
  Filled 2020-09-18: qty 2

## 2020-09-18 MED ORDER — POVIDONE-IODINE 10 % EX SWAB
2.0000 "application " | Freq: Once | CUTANEOUS | Status: AC
  Administered 2020-09-18: 2 via TOPICAL

## 2020-09-18 MED ORDER — ALBUTEROL SULFATE HFA 108 (90 BASE) MCG/ACT IN AERS: 2.0000 | INHALATION_SPRAY | Freq: Four times a day (QID) | RESPIRATORY_TRACT | Status: DC | PRN

## 2020-09-18 MED ORDER — ONDANSETRON HCL 4 MG/2ML IJ SOLN
INTRAMUSCULAR | Status: DC | PRN
  Administered 2020-09-18: 4 mg via INTRAVENOUS

## 2020-09-18 MED ORDER — KETOROLAC TROMETHAMINE 30 MG/ML IJ SOLN
INTRAMUSCULAR | Status: DC | PRN
  Administered 2020-09-18: 30 mg

## 2020-09-18 MED ORDER — PHENYLEPHRINE HCL (PRESSORS) 10 MG/ML IV SOLN
INTRAVENOUS | Status: DC | PRN
  Administered 2020-09-18: 60 ug via INTRAVENOUS
  Administered 2020-09-18 (×3): 80 ug via INTRAVENOUS
  Administered 2020-09-18: 40 ug via INTRAVENOUS

## 2020-09-18 MED ORDER — LACTATED RINGERS IV SOLN: INTRAVENOUS | Status: DC

## 2020-09-18 MED ORDER — HYDROMORPHONE HCL 1 MG/ML IJ SOLN: 0.2500 mg | INTRAMUSCULAR | Status: DC | PRN

## 2020-09-18 MED ORDER — CELECOXIB 200 MG PO CAPS
200.0000 mg | ORAL_CAPSULE | Freq: Two times a day (BID) | ORAL | Status: DC
  Administered 2020-09-18: 200 mg via ORAL
  Filled 2020-09-18 (×2): qty 1

## 2020-09-18 MED ORDER — FENTANYL CITRATE (PF) 100 MCG/2ML IJ SOLN
50.0000 ug | INTRAMUSCULAR | Status: DC
  Administered 2020-09-18: 50 ug via INTRAVENOUS
  Filled 2020-09-18: qty 2

## 2020-09-18 MED ORDER — ATORVASTATIN CALCIUM 40 MG PO TABS: 40.0000 mg | ORAL_TABLET | Freq: Every evening | ORAL | Status: DC

## 2020-09-18 MED ORDER — ONDANSETRON HCL 4 MG/2ML IJ SOLN: 4.0000 mg | Freq: Four times a day (QID) | INTRAMUSCULAR | Status: DC | PRN

## 2020-09-18 MED ORDER — PHENYLEPHRINE 40 MCG/ML (10ML) SYRINGE FOR IV PUSH (FOR BLOOD PRESSURE SUPPORT)
PREFILLED_SYRINGE | INTRAVENOUS | Status: AC
  Filled 2020-09-18: qty 10

## 2020-09-18 MED ORDER — FERROUS SULFATE 325 (65 FE) MG PO TABS
325.0000 mg | ORAL_TABLET | Freq: Two times a day (BID) | ORAL | Status: DC
  Administered 2020-09-18 – 2020-09-19 (×2): 325 mg via ORAL
  Filled 2020-09-18 (×2): qty 1

## 2020-09-18 MED ORDER — POLYETHYLENE GLYCOL 3350 17 G PO PACK
17.0000 g | PACK | Freq: Two times a day (BID) | ORAL | Status: DC
  Administered 2020-09-18 – 2020-09-19 (×2): 17 g via ORAL
  Filled 2020-09-18 (×2): qty 1

## 2020-09-18 MED ORDER — MIDAZOLAM HCL 2 MG/2ML IJ SOLN
INTRAMUSCULAR | Status: AC
  Filled 2020-09-18: qty 2

## 2020-09-18 MED ORDER — PHENOL 1.4 % MT LIQD: 1.0000 | OROMUCOSAL | Status: DC | PRN

## 2020-09-18 MED ORDER — ASPIRIN 81 MG PO CHEW
81.0000 mg | CHEWABLE_TABLET | Freq: Two times a day (BID) | ORAL | Status: DC
  Administered 2020-09-18 – 2020-09-19 (×2): 81 mg via ORAL
  Filled 2020-09-18 (×2): qty 1

## 2020-09-18 MED ORDER — PROPOFOL 500 MG/50ML IV EMUL
INTRAVENOUS | Status: AC
  Filled 2020-09-18: qty 50

## 2020-09-18 MED ORDER — METOCLOPRAMIDE HCL 5 MG/ML IJ SOLN: 5.0000 mg | Freq: Three times a day (TID) | INTRAMUSCULAR | Status: DC | PRN

## 2020-09-18 MED ORDER — CHLORHEXIDINE GLUCONATE 0.12 % MT SOLN
15.0000 mL | Freq: Once | OROMUCOSAL | Status: AC
  Administered 2020-09-18: 15 mL via OROMUCOSAL

## 2020-09-18 MED ORDER — TRANEXAMIC ACID-NACL 1000-0.7 MG/100ML-% IV SOLN
1000.0000 mg | Freq: Once | INTRAVENOUS | Status: AC
  Administered 2020-09-18: 1000 mg via INTRAVENOUS
  Filled 2020-09-18: qty 100

## 2020-09-18 MED ORDER — LIDOCAINE HCL (PF) 2 % IJ SOLN
INTRAMUSCULAR | Status: AC
  Filled 2020-09-18: qty 5

## 2020-09-18 MED ORDER — LISINOPRIL 20 MG PO TABS
40.0000 mg | ORAL_TABLET | Freq: Every day | ORAL | Status: DC
  Administered 2020-09-19: 40 mg via ORAL
  Filled 2020-09-18: qty 2

## 2020-09-18 MED ORDER — STERILE WATER FOR IRRIGATION IR SOLN
Status: DC | PRN
  Administered 2020-09-18: 1000 mL

## 2020-09-18 MED ORDER — MOMETASONE FURO-FORMOTEROL FUM 100-5 MCG/ACT IN AERO
2.0000 | INHALATION_SPRAY | Freq: Two times a day (BID) | RESPIRATORY_TRACT | Status: DC
  Filled 2020-09-18: qty 8.8

## 2020-09-18 MED ORDER — KETOROLAC TROMETHAMINE 30 MG/ML IJ SOLN
INTRAMUSCULAR | Status: AC
  Filled 2020-09-18: qty 1

## 2020-09-18 MED ORDER — CEFAZOLIN SODIUM-DEXTROSE 2-4 GM/100ML-% IV SOLN
2.0000 g | Freq: Four times a day (QID) | INTRAVENOUS | Status: AC
  Administered 2020-09-18 (×2): 2 g via INTRAVENOUS
  Filled 2020-09-18 (×2): qty 100

## 2020-09-18 MED ORDER — METHOCARBAMOL 500 MG PO TABS
500.0000 mg | ORAL_TABLET | Freq: Four times a day (QID) | ORAL | Status: DC | PRN
  Administered 2020-09-18 – 2020-09-19 (×4): 500 mg via ORAL
  Filled 2020-09-18 (×4): qty 1

## 2020-09-18 MED ORDER — LIDOCAINE HCL (CARDIAC) PF 100 MG/5ML IV SOSY
PREFILLED_SYRINGE | INTRAVENOUS | Status: DC | PRN
  Administered 2020-09-18: 40 mg via INTRAVENOUS
  Administered 2020-09-18: 10 mg via INTRAVENOUS

## 2020-09-18 MED ORDER — MAGNESIUM CITRATE PO SOLN: 1.0000 | Freq: Once | ORAL | Status: DC | PRN

## 2020-09-18 MED ORDER — BUPIVACAINE IN DEXTROSE 0.75-8.25 % IT SOLN
INTRATHECAL | Status: DC | PRN
  Administered 2020-09-18: 14 mg via INTRATHECAL

## 2020-09-18 MED ORDER — BUPIVACAINE-EPINEPHRINE (PF) 0.25% -1:200000 IJ SOLN
INTRAMUSCULAR | Status: AC
  Filled 2020-09-18: qty 30

## 2020-09-18 MED ORDER — ACETAMINOPHEN 500 MG PO TABS
ORAL_TABLET | ORAL | Status: AC
  Administered 2020-09-18: 1000 mg via ORAL
  Filled 2020-09-18: qty 2

## 2020-09-18 MED ORDER — ACETAMINOPHEN 500 MG PO TABS: 1000.0000 mg | ORAL_TABLET | Freq: Once | ORAL | Status: AC

## 2020-09-18 MED ORDER — TRANEXAMIC ACID-NACL 1000-0.7 MG/100ML-% IV SOLN
1000.0000 mg | INTRAVENOUS | Status: AC
  Administered 2020-09-18: 1000 mg via INTRAVENOUS
  Filled 2020-09-18: qty 100

## 2020-09-18 MED ORDER — EPHEDRINE SULFATE 50 MG/ML IJ SOLN
INTRAMUSCULAR | Status: DC | PRN
  Administered 2020-09-18: 10 mg via INTRAVENOUS
  Administered 2020-09-18 (×2): 5 mg via INTRAVENOUS

## 2020-09-18 MED ORDER — ORAL CARE MOUTH RINSE: 15.0000 mL | Freq: Once | OROMUCOSAL | Status: AC

## 2020-09-18 MED ORDER — ONDANSETRON HCL 4 MG PO TABS: 4.0000 mg | ORAL_TABLET | Freq: Four times a day (QID) | ORAL | Status: DC | PRN

## 2020-09-18 MED ORDER — ACETAMINOPHEN 500 MG PO TABS
1000.0000 mg | ORAL_TABLET | Freq: Four times a day (QID) | ORAL | Status: DC
  Administered 2020-09-18 – 2020-09-19 (×4): 1000 mg via ORAL
  Filled 2020-09-18 (×4): qty 2

## 2020-09-18 MED ORDER — PROPOFOL 10 MG/ML IV BOLUS
INTRAVENOUS | Status: AC
  Filled 2020-09-18: qty 20

## 2020-09-18 MED ORDER — HYDROCHLOROTHIAZIDE 25 MG PO TABS
25.0000 mg | ORAL_TABLET | Freq: Every day | ORAL | Status: DC
  Administered 2020-09-19: 25 mg via ORAL
  Filled 2020-09-18: qty 1

## 2020-09-18 MED ORDER — METHOCARBAMOL 500 MG IVPB - SIMPLE MED
500.0000 mg | Freq: Four times a day (QID) | INTRAVENOUS | Status: DC | PRN
  Filled 2020-09-18: qty 50

## 2020-09-18 MED ORDER — ONDANSETRON HCL 4 MG/2ML IJ SOLN
INTRAMUSCULAR | Status: AC
  Filled 2020-09-18: qty 2

## 2020-09-18 MED ORDER — DEXAMETHASONE SODIUM PHOSPHATE 10 MG/ML IJ SOLN
10.0000 mg | Freq: Once | INTRAMUSCULAR | Status: AC
  Administered 2020-09-18: 10 mg via INTRAVENOUS

## 2020-09-18 MED ORDER — BUPIVACAINE-EPINEPHRINE (PF) 0.25% -1:200000 IJ SOLN
INTRAMUSCULAR | Status: DC | PRN
  Administered 2020-09-18: 30 mL

## 2020-09-18 MED ORDER — PROMETHAZINE HCL 25 MG/ML IJ SOLN: 6.2500 mg | INTRAMUSCULAR | Status: DC | PRN

## 2020-09-18 MED ORDER — TRAZODONE HCL 50 MG PO TABS
50.0000 mg | ORAL_TABLET | Freq: Every day | ORAL | Status: DC
  Administered 2020-09-18: 50 mg via ORAL
  Filled 2020-09-18: qty 1

## 2020-09-18 MED ORDER — FENTANYL CITRATE (PF) 100 MCG/2ML IJ SOLN
INTRAMUSCULAR | Status: DC | PRN
  Administered 2020-09-18: 50 ug via INTRAVENOUS

## 2020-09-18 MED ORDER — PROPOFOL 500 MG/50ML IV EMUL
INTRAVENOUS | Status: DC | PRN
  Administered 2020-09-18: 50 ug/kg/min via INTRAVENOUS

## 2020-09-18 MED ORDER — GABAPENTIN 300 MG PO CAPS
300.0000 mg | ORAL_CAPSULE | Freq: Every day | ORAL | Status: DC
  Administered 2020-09-18: 300 mg via ORAL
  Filled 2020-09-18: qty 1

## 2020-09-18 MED ORDER — SODIUM CHLORIDE (PF) 0.9 % IJ SOLN
INTRAMUSCULAR | Status: AC
  Filled 2020-09-18: qty 30

## 2020-09-18 MED ORDER — DEXAMETHASONE SODIUM PHOSPHATE 10 MG/ML IJ SOLN
10.0000 mg | Freq: Once | INTRAMUSCULAR | Status: AC
  Administered 2020-09-19: 10 mg via INTRAVENOUS
  Filled 2020-09-18: qty 1

## 2020-09-18 MED ORDER — SODIUM CHLORIDE (PF) 0.9 % IJ SOLN
INTRAMUSCULAR | Status: DC | PRN
  Administered 2020-09-18: 30 mL

## 2020-09-18 MED ORDER — METOCLOPRAMIDE HCL 5 MG PO TABS: 5.0000 mg | ORAL_TABLET | Freq: Three times a day (TID) | ORAL | Status: DC | PRN

## 2020-09-18 MED ORDER — DOCUSATE SODIUM 100 MG PO CAPS
100.0000 mg | ORAL_CAPSULE | Freq: Two times a day (BID) | ORAL | Status: DC
  Administered 2020-09-18 – 2020-09-19 (×2): 100 mg via ORAL
  Filled 2020-09-18 (×2): qty 1

## 2020-09-18 MED ORDER — HYDROMORPHONE HCL 1 MG/ML IJ SOLN
0.5000 mg | INTRAMUSCULAR | Status: DC | PRN
  Administered 2020-09-18 (×2): 1 mg via INTRAVENOUS
  Administered 2020-09-19: 0.5 mg via INTRAVENOUS
  Administered 2020-09-19: 1 mg via INTRAVENOUS
  Filled 2020-09-18 (×4): qty 1

## 2020-09-18 MED ORDER — HYDROMORPHONE HCL 2 MG PO TABS
2.0000 mg | ORAL_TABLET | ORAL | Status: DC | PRN
  Administered 2020-09-18 (×2): 2 mg via ORAL
  Administered 2020-09-18 – 2020-09-19 (×4): 4 mg via ORAL
  Filled 2020-09-18: qty 2
  Filled 2020-09-18: qty 1
  Filled 2020-09-18: qty 2
  Filled 2020-09-18: qty 1
  Filled 2020-09-18 (×2): qty 2

## 2020-09-18 MED ORDER — ROPIVACAINE HCL 7.5 MG/ML IJ SOLN
INTRAMUSCULAR | Status: DC | PRN
  Administered 2020-09-18: 20 mL via PERINEURAL

## 2020-09-18 MED ORDER — 0.9 % SODIUM CHLORIDE (POUR BTL) OPTIME
TOPICAL | Status: DC | PRN
  Administered 2020-09-18: 1000 mL

## 2020-09-18 SURGICAL SUPPLY — 61 items
ADH SKN CLS APL DERMABOND .7 (GAUZE/BANDAGES/DRESSINGS) ×1
ATTUNE MED ANAT PAT 35 KNEE (Knees) ×1 IMPLANT
ATTUNE PS FEM RT SZ 4 CEM KNEE (Femur) ×1 IMPLANT
ATTUNE PSRP INSR SZ4 7 KNEE (Insert) ×1 IMPLANT
BAG SPEC THK2 15X12 ZIP CLS (MISCELLANEOUS)
BAG ZIPLOCK 12X15 (MISCELLANEOUS) IMPLANT
BASEPLATE TIBIAL ROTATING SZ 4 (Knees) ×1 IMPLANT
BLADE SAW SGTL 11.0X1.19X90.0M (BLADE) IMPLANT
BLADE SAW SGTL 13.0X1.19X90.0M (BLADE) ×2 IMPLANT
BLADE SURG SZ10 CARB STEEL (BLADE) ×4 IMPLANT
BNDG CMPR MED 10X6 ELC LF (GAUZE/BANDAGES/DRESSINGS) ×1
BNDG ELASTIC 6X10 VLCR STRL LF (GAUZE/BANDAGES/DRESSINGS) ×1 IMPLANT
BNDG ELASTIC 6X5.8 VLCR STR LF (GAUZE/BANDAGES/DRESSINGS) ×2 IMPLANT
BOWL SMART MIX CTS (DISPOSABLE) ×2 IMPLANT
BSPLAT TIB 4 CMNT ROT PLAT STR (Knees) ×1 IMPLANT
CEMENT HV SMART SET (Cement) ×2 IMPLANT
COVER WAND RF STERILE (DRAPES) IMPLANT
CUFF TOURN SGL QUICK 34 (TOURNIQUET CUFF) ×2
CUFF TRNQT CYL 34X4.125X (TOURNIQUET CUFF) ×1 IMPLANT
DECANTER SPIKE VIAL GLASS SM (MISCELLANEOUS) ×4 IMPLANT
DERMABOND ADVANCED (GAUZE/BANDAGES/DRESSINGS) ×1
DERMABOND ADVANCED .7 DNX12 (GAUZE/BANDAGES/DRESSINGS) ×1 IMPLANT
DRAPE U-SHAPE 47X51 STRL (DRAPES) ×2 IMPLANT
DRESSING AQUACEL AG SP 3.5X10 (GAUZE/BANDAGES/DRESSINGS) ×1 IMPLANT
DRSG AQUACEL AG ADV 3.5X10 (GAUZE/BANDAGES/DRESSINGS) ×1 IMPLANT
DRSG AQUACEL AG SP 3.5X10 (GAUZE/BANDAGES/DRESSINGS) ×2
DURAPREP 26ML APPLICATOR (WOUND CARE) ×4 IMPLANT
ELECT REM PT RETURN 15FT ADLT (MISCELLANEOUS) ×2 IMPLANT
GLOVE BIOGEL PI IND STRL 7.5 (GLOVE) ×1 IMPLANT
GLOVE BIOGEL PI IND STRL 8.5 (GLOVE) IMPLANT
GLOVE BIOGEL PI INDICATOR 7.5 (GLOVE) ×1
GLOVE BIOGEL PI INDICATOR 8.5 (GLOVE)
GLOVE ECLIPSE 8.0 STRL XLNG CF (GLOVE) ×2 IMPLANT
GLOVE ORTHO TXT STRL SZ7.5 (GLOVE) ×2 IMPLANT
GLOVE SURG ENC MOIS LTX SZ6 (GLOVE) IMPLANT
GLOVE SURG UNDER POLY LF SZ6.5 (GLOVE) IMPLANT
GOWN STRL REUS W/TWL 2XL LVL3 (GOWN DISPOSABLE) IMPLANT
GOWN STRL REUS W/TWL LRG LVL3 (GOWN DISPOSABLE) ×2 IMPLANT
HANDPIECE INTERPULSE COAX TIP (DISPOSABLE) ×2
HOLDER FOLEY CATH W/STRAP (MISCELLANEOUS) IMPLANT
KIT TURNOVER KIT A (KITS) IMPLANT
MANIFOLD NEPTUNE II (INSTRUMENTS) ×2 IMPLANT
NDL SAFETY ECLIPSE 18X1.5 (NEEDLE) IMPLANT
NEEDLE HYPO 18GX1.5 SHARP (NEEDLE)
NS IRRIG 1000ML POUR BTL (IV SOLUTION) ×2 IMPLANT
PACK TOTAL KNEE CUSTOM (KITS) ×2 IMPLANT
PENCIL SMOKE EVACUATOR (MISCELLANEOUS) IMPLANT
PIN DRILL FIX HALF THREAD (BIT) ×1 IMPLANT
PIN FIX SIGMA LCS THRD HI (PIN) ×1 IMPLANT
PROTECTOR NERVE ULNAR (MISCELLANEOUS) ×2 IMPLANT
SET HNDPC FAN SPRY TIP SCT (DISPOSABLE) ×1 IMPLANT
SET PAD KNEE POSITIONER (MISCELLANEOUS) ×2 IMPLANT
SUT MNCRL AB 4-0 PS2 18 (SUTURE) ×2 IMPLANT
SUT STRATAFIX PDS+ 0 24IN (SUTURE) ×2 IMPLANT
SUT VIC AB 1 CT1 36 (SUTURE) ×2 IMPLANT
SUT VIC AB 2-0 CT1 27 (SUTURE) ×6
SUT VIC AB 2-0 CT1 TAPERPNT 27 (SUTURE) ×3 IMPLANT
SYR 3ML LL SCALE MARK (SYRINGE) ×2 IMPLANT
TRAY FOLEY MTR SLVR 16FR STAT (SET/KITS/TRAYS/PACK) ×2 IMPLANT
WATER STERILE IRR 1000ML POUR (IV SOLUTION) ×4 IMPLANT
WRAP KNEE MAXI GEL POST OP (GAUZE/BANDAGES/DRESSINGS) ×2 IMPLANT

## 2020-09-18 NOTE — Evaluation (Signed)
Physical Therapy Evaluation Patient Details Name: [name] MRN: [id number] DOB: [birth year] Today's Date: 09/18/2020   History of Present Illness  s/p  R TKA. PMH: bil elbow reconstruction, fibromyalgia  Clinical Impression  Pt is s/p TKA resulting in the deficits listed below (see PT Problem List).  Pt amb 30' with RW, distance limited by PT d/t pt with incr pain.  Anticipate steady progress in acute setting.   Pt will benefit from skilled PT to increase their independence and safety with mobility to allow discharge to the venue listed below.      Follow Up Recommendations Follow surgeon's recommendation for DC plan and follow-up therapies    Equipment Recommendations  None recommended by PT    Recommendations for Other Services       Precautions / Restrictions Precautions Precautions: Knee Restrictions Weight Bearing Restrictions: No      Mobility  Bed Mobility Overal bed mobility: Needs Assistance Bed Mobility: Supine to Sit     Supine to sit: Min guard     General bed mobility comments: incr time, cues to self assist    Transfers Overall transfer level: Needs assistance Equipment used: Rolling walker (2 wheeled) Transfers: Sit to/from Stand Sit to Stand: Min assist         General transfer comment: cues for hand placement and RLE position  Ambulation/Gait Ambulation/Gait assistance: Min assist;Min guard Gait Distance (Feet): 30 Feet Assistive device: Rolling walker (2 wheeled) Gait Pattern/deviations: Step-to pattern;Decreased stance time - right     General Gait Details: cues for sequence and RW position  Stairs            Wheelchair Mobility    Modified Rankin (Stroke Patients Only)       Balance Overall balance assessment: Needs assistance Sitting-balance support: Feet supported;No upper extremity supported Sitting balance-Leahy Scale: Good       Standing balance-Leahy Scale: Poor Standing balance comment: reliant on  UEs                             Pertinent Vitals/Pain Pain Assessment: 0-10 Pain Score: 8  Pain Location: right knee Pain Descriptors / Indicators: Aching;Discomfort;Grimacing;Sore Pain Intervention(s): Limited activity within patient's tolerance;Monitored during session;Premedicated before session;Repositioned;Ice applied;Patient requesting pain meds-RN notified    Home Living Family/patient expects to be discharged to:: Private residence Living Arrangements: Spouse/significant other Available Help at Discharge: Family Type of Home: House Home Access: Stairs to enter   [employer] of Steps: 1 Home Layout: One level Home Equipment: Environmental consultant - 2 wheels;Cane - single point;Adaptive equipment      Prior Function Level of Independence: Independent               Hand Dominance        Extremity/Trunk Assessment   Upper Extremity Assessment Upper Extremity Assessment: Overall WFL for tasks assessed    Lower Extremity Assessment Lower Extremity Assessment: RLE deficits/detail RLE Deficits / Details: ankle WFL, knee extension and hip flexion 3/5       Communication   Communication: No difficulties  Cognition Arousal/Alertness: Awake/alert Behavior During Therapy: WFL for tasks assessed/performed Overall Cognitive Status: Within Functional Limits for tasks assessed                                        General Comments      Exercises Total Joint  Exercises Ankle Circles/Pumps: AROM;10 reps;Both Quad Sets: AROM;Both;10 reps   Assessment/Plan    PT Assessment Patient needs continued PT services  PT Problem List Decreased strength;Decreased mobility;Decreased range of motion;Decreased activity tolerance;Decreased balance;Pain;Decreased knowledge of use of DME       PT Treatment Interventions DME instruction;Therapeutic activities;Gait training;Functional mobility training;Stair training;Therapeutic exercise;Patient/family  education    PT Goals (Current goals can be found in the Care Plan section)  Acute Rehab PT Goals Patient Stated Goal: less knee pain after rehab PT Goal Formulation: With patient Time For Goal Achievement: 09/24/20 Potential to Achieve Goals: Good    Frequency 7X/week   Barriers to discharge        Co-evaluation               AM-PAC PT "6 Clicks" Mobility  Outcome Measure Help needed turning from your back to your side while in a flat bed without using bedrails?: A Little Help needed moving from lying on your back to sitting on the side of a flat bed without using bedrails?: A Little Help needed moving to and from a bed to a chair (including a wheelchair)?: A Little Help needed standing up from a chair using your arms (e.g., wheelchair or bedside chair)?: A Little Help needed to walk in hospital room?: A Little Help needed climbing 3-5 steps with a railing? : A Little 6 Click Score: 18    End of Session Equipment Utilized During Treatment: Gait belt Activity Tolerance: Patient tolerated treatment well Patient left: in chair;with call bell/phone within reach;with chair alarm set;with family/visitor present Nurse Communication: Mobility status PT Visit Diagnosis: Difficulty in walking, not elsewhere classified (R26.2)    Time: 2423-5361 PT Time Calculation (min) (ACUTE ONLY): 23 min   Charges:   PT Evaluation $PT Eval Low Complexity: 1 Low PT Treatments $Gait Training: 8-22 mins        Delice Bison, PT  Acute Rehab Dept ([facility]) [phone] Pager [phone]  09/18/2020   [facility] 09/18/2020, 3:57 PM

## 2020-09-18 NOTE — Anesthesia Postprocedure Evaluation (Signed)
Anesthesia Post Note  Patient: [name]  Procedure(s) Performed: TOTAL KNEE ARTHROPLASTY (Right Knee)     Patient location during evaluation: PACU Anesthesia Type: Spinal Level of consciousness: awake and alert, patient cooperative and oriented Pain management: pain level controlled Vital Signs Assessment: post-procedure vital signs reviewed and stable Respiratory status: nonlabored ventilation, spontaneous breathing and respiratory function stable Cardiovascular status: blood pressure returned to baseline and stable Postop Assessment: spinal receding, patient able to bend at knees and no apparent nausea or vomiting Anesthetic complications: no   No complications documented.  Last Vitals:  Vitals:   09/18/20 1318 09/18/20 1559  BP: (!) 141/73 140/70  Pulse: 74 70  Resp: 14 16  Temp: 36.7 C 36.8 C  SpO2: 100% 100%    Last Pain:  Vitals:   09/18/20 1559  TempSrc: Oral  PainSc:                  Vaidehi Braddy,E. Kandice Schmelter

## 2020-09-18 NOTE — Transfer of Care (Signed)
Immediate Anesthesia Transfer of Care Note  Patient: [name]  Procedure(s) Performed: TOTAL KNEE ARTHROPLASTY (Right Knee)  Patient Location: PACU  Anesthesia Type:Spinal  Level of Consciousness: awake, alert , oriented and patient cooperative  Airway & Oxygen Therapy: Patient Spontanous Breathing and Patient connected to face mask oxygen  Post-op Assessment: Report given to RN and Post -op Vital signs reviewed and stable  Post vital signs: Reviewed and stable  Last Vitals:  Vitals Value Taken Time  BP 120/63 09/18/20 1145  Temp    Pulse 84 09/18/20 1146  Resp 28 09/18/20 1146  SpO2 100 % 09/18/20 1146  Vitals shown include unvalidated device data.  Last Pain:  Vitals:   09/18/20 0749  TempSrc:   PainSc: 4       Patients Stated Pain Goal: 4 (09/18/20 0749)  Complications: No complications documented.

## 2020-09-18 NOTE — Discharge Instructions (Signed)

## 2020-09-18 NOTE — Op Note (Signed)
NAME:  [name]                      MEDICAL RECORD NO.:  [id number]                             FACILITY:  [facility]      PHYSICIAN:  Madlyn Frankel. Charlann Boxer, M.D.  DATE OF BIRTH:  January 18, 1956      DATE OF PROCEDURE:  09/18/2020                                     OPERATIVE REPORT         PREOPERATIVE DIAGNOSIS:  Right knee osteoarthritis.      POSTOPERATIVE DIAGNOSIS:  Right knee osteoarthritis.      FINDINGS:  The patient was noted to have complete loss of cartilage and   bone-on-bone arthritis with associated osteophytes in the lateral and patellofemoral compartments of   the knee with a significant synovitis and associated effusion.  The patient had failed months of conservative treatment including medications, injection therapy, activity modification.     PROCEDURE:  Right total knee replacement.      COMPONENTS USED:  DePuy Attune rotating platform posterior stabilized knee   system, a size 4 femur, 4 tibia, size 7 mm PS AOX insert, and 35 anatomic patellar   button.      SURGEON:  Madlyn Frankel. Charlann Boxer, M.D.      ASSISTANT:  Dennie Bible, PA-C.      ANESTHESIA:  Regional and Spinal.      SPECIMENS:  None.      COMPLICATION:  None.      DRAINS:  None.  EBL: <100cc      TOURNIQUET TIME:   Total Tourniquet Time Documented: Thigh (Right) - 29 minutes Total: Thigh (Right) - 29 minutes  .      The patient was stable to the recovery room.      INDICATION FOR PROCEDURE:  [name] is a 64 y.o. female patient of   mine.  The patient had been seen, evaluated, and treated for months conservatively in the   office with medication, activity modification, and injections.  The patient had   radiographic changes of bone-on-bone arthritis with endplate sclerosis and osteophytes noted.  Based on the radiographic changes and failed conservative measures, the patient   decided to proceed with definitive treatment, total knee replacement.  Risks of infection, DVT, component  failure, need for revision surgery, neurovascular injury were reviewed in the office setting.  The postop course was reviewed stressing the efforts to maximize post-operative satisfaction and function.  Consent was obtained for benefit of pain   relief.      PROCEDURE IN DETAIL:  The patient was brought to the operative theater.   Once adequate anesthesia, preoperative antibiotics, 2 gm of Ancef,1 gm of Tranexamic Acid, and 10 mg of Decadron administered, the patient was positioned supine with a right thigh tourniquet placed.  The  right lower extremity was prepped and draped in sterile fashion.  A time-   out was performed identifying the patient, planned procedure, and the appropriate extremity.      The right lower extremity was placed in the [facility] leg holder.  The leg was   exsanguinated, tourniquet elevated to 250 mmHg.  A midline incision was   made  followed by median parapatellar arthrotomy.  Following initial   exposure, attention was first directed to the patella.  Precut   measurement was noted to be 22-23 mm.  I resected down to 13 mm and used a   35 anatomic patellar button to restore patellar height as well as cover the cut surface.      The lug holes were drilled and a metal shim was placed to protect the   patella from retractors and saw blade during the procedure.      At this point, attention was now directed to the femur.  The femoral   canal was opened with a drill, irrigated to try to prevent fat emboli.  An   intramedullary rod was passed at 3 degrees valgus, 9 mm of bone was   resected off the distal femur.  Following this resection, the tibia was   subluxated anteriorly.  Using the extramedullary guide, 3 mm of bone was resected off   the proximal medial tibia.  We confirmed the gap would be   stable medially and laterally with a size 6 spacer block as well as confirmed that the tibial cut was perpendicular in the coronal plane, checking with an alignment rod.       Once this was done, I sized the femur to be a size 4 in the anterior-   posterior dimension, chose a standard component based on medial and   lateral dimension.  The size 4 rotation block was then pinned in   position anterior referenced using the C-clamp to set rotation.  The   anterior, posterior, and  chamfer cuts were made without difficulty nor   notching making certain that I was along the anterior cortex to help   with flexion gap stability.      The final box cut was made off the lateral aspect of distal femur.      At this point, the tibia was sized to be a size 4.  The size 4 tray was   then pinned in position through the medial third of the tubercle,   drilled, and keel punched.  Trial reduction was now carried with a 4 femur,  4 tibia, a size 7 mm PS insert, and the 35 anatomic patella botton.  The knee was brought to full extension with good flexion stability with the patella   tracking through the trochlea without application of pressure.  Given   all these findings the trial components removed.  Final components were   opened and cement was mixed.  The knee was irrigated with normal saline solution and pulse lavage.  The synovial lining was   then injected with 30 cc of 0.25% Marcaine with epinephrine, 1 cc of Toradol and 30 cc of NS for a total of 61 cc.     Final implants were then cemented onto cleaned and dried cut surfaces of bone with the knee brought to extension with a size 7 mm PS trial insert.      Once the cement had fully cured, excess cement was removed   throughout the knee.  I confirmed that I was satisfied with the range of   motion and stability, and the final size 7 mm PS AOX insert was chosen.  It was   placed into the knee.      The tourniquet had been let down at 29 minutes.  No significant   hemostasis was required.  The extensor mechanism was then reapproximated using #1 Vicryl and #  1 Stratafix sutures with the knee   in flexion.  The   remaining  wound was closed with 2-0 Vicryl and running 4-0 Monocryl.   The knee was cleaned, dried, dressed sterilely using Dermabond and   Aquacel dressing.  The patient was then   brought to recovery room in stable condition, tolerating the procedure   well.   Please note that Physician Assistant, Dennie Bible, PA-C was present for the entirety of the case, and was utilized for pre-operative positioning, peri-operative retractor management, general facilitation of the procedure and for primary wound closure at the end of the case.              Madlyn Frankel Charlann Boxer, M.D.    09/18/2020 11:17 AM

## 2020-09-18 NOTE — Anesthesia Preprocedure Evaluation (Addendum)
Anesthesia Evaluation  Patient identified by MRN, date of birth, ID band Patient awake    Reviewed: Allergy & Precautions, NPO status , Patient's Chart, lab work & pertinent test results  History of Anesthesia Complications (+) PONV  Airway Mallampati: II  TM Distance: >3 FB Neck ROM: Full    Dental  (+) Dental Advisory Given   Pulmonary COPD,  COPD inhaler, former smoker,  09/17/2020 SARS coronavirus NEG   breath sounds clear to auscultation       Cardiovascular hypertension, Pt. on medications (-) angina Rhythm:Regular Rate:Normal  '14 Stress: No evidence of myocardial ischemia or infarction. Normal left ventricular wall motion. Estimated QGS ejection fraction 63%.    Neuro/Psych negative neurological ROS  negative psych ROS   GI/Hepatic Neg liver ROS, GERD  Medicated and Controlled,  Endo/Other  diabetes (diet controlled), Well Controlled  Renal/GU negative Renal ROS     Musculoskeletal  (+) Arthritis , Fibromyalgia -  Abdominal (+) + obese,   Peds  Hematology negative hematology ROS (+)   Anesthesia Other Findings   Reproductive/Obstetrics                            Anesthesia Physical Anesthesia Plan  ASA: III  Anesthesia Plan: Spinal   Post-op Pain Management:  Regional for Post-op pain   Induction:   PONV Risk Score and Plan: 3 and Ondansetron and Dexamethasone  Airway Management Planned: Natural Airway and Simple Face Mask  Additional Equipment: None  Intra-op Plan:   Post-operative Plan:   Informed Consent: I have reviewed the patients History and Physical, chart, labs and discussed the procedure including the risks, benefits and alternatives for the proposed anesthesia with the patient or authorized representative who has indicated his/her understanding and acceptance.     Dental advisory given  Plan Discussed with: CRNA and Surgeon  Anesthesia Plan Comments:  (plan routine monitors, SAB with adductor canal block for post op analgesia)       Anesthesia Quick Evaluation

## 2020-09-18 NOTE — Interval H&P Note (Signed)
History and Physical Interval Note:  09/18/2020 8:45 AM  [name]  has presented today for surgery, with the diagnosis of Right knee osteoarthritis.  The various methods of treatment have been discussed with the patient and family. After consideration of risks, benefits and other options for treatment, the patient has consented to  Procedure(s) with comments: TOTAL KNEE ARTHROPLASTY (Right) - 70 mins as a surgical intervention.  The patient's history has been reviewed, patient examined, no change in status, stable for surgery.  I have reviewed the patient's chart and labs.  Questions were answered to the patient's satisfaction.     Shelda Pal

## 2020-09-18 NOTE — Progress Notes (Signed)
AssistedDr. Carswell Jackson with right, ultrasound guided, adductor canal block. Side rails up, monitors on throughout procedure. See vital signs in flow sheet. Tolerated Procedure well.  

## 2020-09-18 NOTE — Anesthesia Procedure Notes (Addendum)
Anesthesia Regional Block: Adductor canal block   Pre-Anesthetic Checklist: ,, timeout performed, Correct Patient, Correct Site, Correct Laterality, Correct Procedure, Correct Position, site marked, Risks and benefits discussed,  Surgical consent,  Pre-op evaluation,  At surgeon's request and post-op pain management  Laterality: Right and Lower  Prep: chloraprep       Needles:  Injection technique: Single-shot  Needle Type: Echogenic Needle     Needle Length: 9cm  Needle Gauge: 21     Additional Needles:   Procedures:,,,, ultrasound used (permanent image in chart),,,,  Narrative:  Start time: 09/18/2020 8:49 AM End time: 09/18/2020 8:55 AM Injection made incrementally with aspirations every 5 mL.  Performed by: Personally  Anesthesiologist: Jairo Ben, MD  Additional Notes: Pt identified in Holding room.  Monitors applied. Working IV access confirmed. Sterile prep R thigh.  #21ga ECHOgenic needle into adductor canal with US guidance.  20cc 0.75% Ropivacaine injected incrementally after negative test dose.  Patient asymptomatic, VSS, no heme aspirated, tolerated well.  Sandford Craze, MD

## 2020-09-18 NOTE — Progress Notes (Signed)
Pt refuses to take hospital-provided Dulera and does not want to be charged for this medication.  Dulera sent back to pharmacy per pt request.  Pt states she will take her symbicort from home instead.  RN aware.

## 2020-09-18 NOTE — Anesthesia Procedure Notes (Signed)
Spinal  Patient location during procedure: OR Start time: 09/18/2020 10:00 AM End time: 09/18/2020 10:06 AM Staffing Performed: resident/CRNA  Resident/CRNA: Garrel Ridgel, CRNA Preanesthetic Checklist Completed: patient identified, IV checked, site marked, risks and benefits discussed, surgical consent, monitors and equipment checked, pre-op evaluation and timeout performed Spinal Block Patient position: sitting Prep: Betadine Patient monitoring: heart rate, continuous pulse ox and blood pressure Location: L4-5 Injection technique: single-shot Needle Needle type: Sprotte and Pencan  Needle gauge: 24 G Needle length: 9 cm Needle insertion depth: 5 cm Assessment Sensory level: T10 Additional Notes Expiration date of kit checked and confirmed. Patient tolerated procedure well, without complications.

## 2020-09-19 ENCOUNTER — Encounter (HOSPITAL_COMMUNITY): Payer: Self-pay | Admitting: Orthopedic Surgery

## 2020-09-19 DIAGNOSIS — M1711 Unilateral primary osteoarthritis, right knee: Secondary | ICD-10-CM | POA: Diagnosis not present

## 2020-09-19 LAB — BASIC METABOLIC PANEL
Anion gap: 8 (ref 5–15)
BUN: 19 mg/dL (ref 8–23)
CO2: 25 mmol/L (ref 22–32)
Calcium: 8.9 mg/dL (ref 8.9–10.3)
Chloride: 104 mmol/L (ref 98–111)
Creatinine, Ser: 0.97 mg/dL (ref 0.44–1.00)
GFR, Estimated: >60 mL/min (ref >60)
Glucose, Bld: 130 mg/dL — ABNORMAL HIGH (ref 70–99)
Potassium: 3.9 mmol/L (ref 3.5–5.1)
Sodium: 137 mmol/L (ref 135–145)

## 2020-09-19 LAB — CBC
HCT: 33.3 % — ABNORMAL LOW (ref 36.0–46.0)
Hemoglobin: 10.7 g/dL — ABNORMAL LOW (ref 12.0–15.0)
MCH: 30.8 pg (ref 26.0–34.0)
MCHC: 32.1 g/dL (ref 30.0–36.0)
MCV: 96 fL (ref 80.0–100.0)
Platelets: 175 10*3/uL (ref 150–400)
RBC: 3.47 MIL/uL — ABNORMAL LOW (ref 3.87–5.11)
RDW: 12 % (ref 11.5–15.5)
WBC: 12.2 10*3/uL — ABNORMAL HIGH (ref 4.0–10.5)
nRBC: 0 % (ref 0.0–0.2)

## 2020-09-19 MED ORDER — MELOXICAM 15 MG PO TABS: 15.0000 mg | ORAL_TABLET | Freq: Every day | ORAL | 2 refills | Status: DC

## 2020-09-19 MED ORDER — ASPIRIN 81 MG PO CHEW: 81.0000 mg | CHEWABLE_TABLET | Freq: Two times a day (BID) | ORAL | 0 refills | Status: AC

## 2020-09-19 MED ORDER — HYDROMORPHONE HCL 2 MG PO TABS: 2.0000 mg | ORAL_TABLET | ORAL | 0 refills | Status: DC | PRN

## 2020-09-19 MED ORDER — METHOCARBAMOL 500 MG PO TABS: 500.0000 mg | ORAL_TABLET | Freq: Four times a day (QID) | ORAL | 0 refills | Status: DC | PRN

## 2020-09-19 MED ORDER — FERROUS SULFATE 325 (65 FE) MG PO TABS: 325.0000 mg | ORAL_TABLET | Freq: Two times a day (BID) | ORAL | 0 refills | Status: DC

## 2020-09-19 NOTE — Progress Notes (Signed)
Physical Therapy Treatment Patient Details Name: [name] MRN: [id number] DOB: 12-Oct-1955 Today's Date: 09/19/2020    History of Present Illness s/p  R TKA. PMH: bil elbow reconstruction, fibromyalgia    PT Comments    Pt progressing well, pain well controlled during PT. Will see again in pm and pt is hopeful to d/c later today   Follow Up Recommendations  Follow surgeon's recommendation for DC plan and follow-up therapies     Equipment Recommendations  None recommended by PT    Recommendations for Other Services       Precautions / Restrictions Precautions Precautions: Knee Restrictions Weight Bearing Restrictions: No    Mobility  Bed Mobility Overal bed mobility: Needs Assistance Bed Mobility: Sit to Supine       Sit to supine: Min guard   General bed mobility comments: incr time, cues to self assist using gait belt as leg lifter  Transfers Overall transfer level: Needs assistance Equipment used: Rolling walker (2 wheeled) Transfers: Sit to/from Stand Sit to Stand: Min guard         General transfer comment: cues for hand placement and RLE position  Ambulation/Gait Ambulation/Gait assistance: Min guard;Supervision Gait Distance (Feet): 80 Feet Assistive device: Rolling walker (2 wheeled) Gait Pattern/deviations: Step-to pattern;Decreased stance time - right     General Gait Details: cues for sequence and RW position   Stairs             Wheelchair Mobility    Modified Rankin (Stroke Patients Only)       Balance                                            Cognition Arousal/Alertness: Awake/alert Behavior During Therapy: WFL for tasks assessed/performed Overall Cognitive Status: Within Functional Limits for tasks assessed                                        Exercises Total Joint Exercises Ankle Circles/Pumps: AROM;10 reps;Both Quad Sets: AROM;Both;10 reps Heel Slides: AROM;Right;10  reps;AAROM Hip ABduction/ADduction: AROM;Right;10 reps Straight Leg Raises: AAROM;10 reps;Right Goniometric ROM: grossly 8 to 65 degrees knee flexion    General Comments        Pertinent Vitals/Pain Pain Assessment: 0-10 Pain Score: 7  Pain Location: right knee Pain Descriptors / Indicators: Aching;Discomfort;Grimacing;Sore Pain Intervention(s): Limited activity within patient's tolerance;Monitored during session;Premedicated before session;Repositioned;Ice applied    Home Living                      Prior Function            PT Goals (current goals can now be found in the care plan section) Acute Rehab PT Goals Patient Stated Goal: less knee pain after rehab PT Goal Formulation: With patient Time For Goal Achievement: 09/24/20 Potential to Achieve Goals: Good Progress towards PT goals: Progressing toward goals    Frequency    7X/week      PT Plan Current plan remains appropriate    Co-evaluation              AM-PAC PT "6 Clicks" Mobility   Outcome Measure  Help needed turning from your back to your side while in a flat bed without using bedrails?: A Little Help needed moving from  lying on your back to sitting on the side of a flat bed without using bedrails?: A Little Help needed moving to and from a bed to a chair (including a wheelchair)?: A Little Help needed standing up from a chair using your arms (e.g., wheelchair or bedside chair)?: A Little Help needed to walk in hospital room?: A Little Help needed climbing 3-5 steps with a railing? : A Little 6 Click Score: 18    End of Session Equipment Utilized During Treatment: Gait belt Activity Tolerance: Patient tolerated treatment well Patient left: with call bell/phone within reach;with family/visitor present;in bed;with bed alarm set Nurse Communication: Mobility status PT Visit Diagnosis: Difficulty in walking, not elsewhere classified (R26.2)     Time: 8676-1950 PT Time Calculation  (min) (ACUTE ONLY): 25 min  Charges:  $Gait Training: 8-22 mins $Therapeutic Exercise: 8-22 mins                     Delice Bison, PT  Acute Rehab Dept ([facility]) [phone] Pager [phone]  09/19/2020    [facility] 09/19/2020, 12:47 PM

## 2020-09-19 NOTE — Progress Notes (Signed)
Subjective: 1 Day Post-Op Procedure(s) (LRB): TOTAL KNEE ARTHROPLASTY (Right) Patient reports pain as severe.   Patient seen in rounds by Dr. Charlann Boxer. Patient is well, and has had no acute complaints or problems other than pain in the right knee. No acute events overnight. Voiding without difficulty. Ambulated 30 feet with PT yesterday. She states she does want to go home today.  We will cpntinue therapy today.   Objective: Vital signs in last 24 hours: Temp:  [97.4 F (36.3 C)-98.3 F (36.8 C)] 97.9 F (36.6 C) (12/29 0535) Pulse Rate:  [64-87] 65 (12/29 0535) Resp:  [13-25] 16 (12/29 0535) BP: (120-153)/(59-79) 143/79 (12/29 0535) SpO2:  [96 %-100 %] 99 % (12/29 0535) Weight:  [88.9 kg] 88.9 kg (12/28 0749)  Intake/Output from previous day:  Intake/Output Summary (Last 24 hours) at 09/19/2020 0729 Last data filed at 09/19/2020 0630 Gross per 24 hour  Intake 3589.89 ml  Output 2225 ml  Net 1364.89 ml     Intake/Output this shift: No intake/output data recorded.  Labs: Recent Labs    09/19/20 0250  HGB 10.7*   Recent Labs    09/19/20 0250  WBC 12.2*  RBC 3.47*  HCT 33.3*  PLT 175   Recent Labs    09/18/20 0800 09/19/20 0250  NA 139 137  K 3.8 3.9  CL 102 104  CO2 20* 25  BUN 18 19  CREATININE 1.20* 0.97  GLUCOSE 141* 130*  CALCIUM 9.5 8.9   No results for input(s): LABPT, INR in the last 72 hours.  Exam: General - Patient is Alert and Oriented Extremity - Neurologically intact Sensation intact distally Intact pulses distally Dorsiflexion/Plantar flexion intact Dressing - dressing C/D/I Motor Function - intact, moving foot and toes well on exam.   Past Medical History:  Diagnosis Date  . Arthritis    RIGHT KNEE  . Asthma   . Chronic back pain   . DDD (degenerative disc disease), lumbar   . Diabetes mellitus type 2, diet-controlled (HCC)    pt denies   . Fibromyalgia   . GERD (gastroesophageal reflux disease)   . Hemorrhoids     INTERNAL AND EXTERNAL  . History of colon polyps    2009-- BENIGN  . Hyperlipidemia   . Hypertension   . PONV (postoperative nausea and vomiting)   . Right knee meniscal tear     Assessment/Plan: 1 Day Post-Op Procedure(s) (LRB): TOTAL KNEE ARTHROPLASTY (Right) Active Problems:   S/P total knee arthroplasty, right  Estimated body mass index is 31.64 kg/m as calculated from the following:   Height as of this encounter: 5\' 6"  (1.676 m).   Weight as of this encounter: 88.9 kg. Advance diet Up with therapy D/C IV fluids   Patient's anticipated LOS is less than 2 midnights, meeting these requirements: - Younger than 88 - Lives within 1 hour of care - Has a competent adult at home to recover with post-op recover - NO history of  - Diabetes  - Coronary Artery Disease  - Heart failure  - Heart attack  - Stroke  - DVT/VTE  - Cardiac arrhythmia  - Respiratory Failure/COPD  - Renal failure  - Anemia  - Advanced Liver disease  DVT Prophylaxis - Aspirin Weight bearing as tolerated.   Plan is to go Home after hospital stay. Plan for discharge home today following 1-2 sessions of therapy as long as she is meeting her goals and pain is relatively controlled. Dr. 76 had a long discussion regarding  pain after knee replacement, and we will utilize Meloxicam at discharge as well. We will work on decreasing IV pain meds today in anticipation of discharge today vs tomorrow. Follow up in the office in 2 weeks.   Dennie Bible, PA-C Orthopedic Surgery [phone] 09/19/2020, 7:29 AM

## 2020-09-19 NOTE — TOC Transition Note (Signed)
Transition of Care [facility]) - CM/SW Discharge Note   Patient Details  Name: [name] MRN: [id number] Date of Birth: 1955-12-30  [facility] CM/SW Contact:  Lennart Pall, LCSW Phone Number: 09/19/2020, 1:00 PM   Clinical Narrative:    Met briefly with pt and spouse to review dc. They confirm patient has all needed DME at home. Plan for OPPT at [facility].  No TOC needs.   Final next level of care: OP Rehab Barriers to Discharge: No Barriers Identified   Patient Goals and CMS Choice Patient states their goals for this hospitalization and ongoing recovery are:: go home      Discharge Placement                       Discharge Plan and Services                DME Arranged: N/A DME Agency: NA                  Social Determinants of Health (SDOH) Interventions     Readmission Risk Interventions No flowsheet data found.

## 2020-09-19 NOTE — Progress Notes (Signed)
09/19/20 1400  PT Visit Information  Last PT Received On 09/19/20  Assistance Needed +1  Pt progressing well, meeting PT goals. Ready to d/c with husband assist prn.   History of Present Illness s/p  R TKA. PMH: bil elbow reconstruction, fibromyalgia  Subjective Data  Patient Stated Goal less knee pain after rehab  Precautions  Precautions Knee  Restrictions  Weight Bearing Restrictions No  Other Position/Activity Restrictions WBAT  Pain Assessment  Pain Assessment 0-10  Pain Score 4  Pain Location right knee  Pain Descriptors / Indicators Aching;Discomfort;Grimacing;Sore  Pain Intervention(s) Limited activity within patient's tolerance;Monitored during session;Premedicated before session;Repositioned  Cognition  Arousal/Alertness Awake/alert  Behavior During Therapy WFL for tasks assessed/performed  Overall Cognitive Status Within Functional Limits for tasks assessed  Bed Mobility  Overal bed mobility Needs Assistance  Bed Mobility Sit to Supine;Supine to Sit  Supine to sit Supervision;Modified independent (Device/Increase time)  Sit to supine Min guard  General bed mobility comments incr time, cues to self assist using gait belt as leg lifter. demo's good carryover from previous session  Transfers  Overall transfer level Needs assistance  Equipment used Rolling walker (2 wheeled)  Transfers Sit to/from Stand  Sit to Stand Min guard  General transfer comment cues for hand placement and RLE position  Ambulation/Gait  Ambulation/Gait assistance Supervision  Gait Distance (Feet) 40 Feet  Assistive device Rolling walker (2 wheeled)  Gait Pattern/deviations Step-to pattern;Decreased stance time - right  General Gait Details cues for sequence and RW position  Stairs Yes  Stairs assistance Min guard;Min assist  Stair Management No rails;Step to pattern;Forwards;Backwards;With walker  Number of Stairs 2  General stair comments cues for sequence and technique. 1 step  backwards and forwars. improved pain control with going up backward. pt and husband familiar with techniques from past surgeries  PT - End of Session  Equipment Utilized During Treatment Gait belt  Activity Tolerance Patient tolerated treatment well  Patient left with call bell/phone within reach;with family/visitor present;in bed;with bed alarm set  Nurse Communication Mobility status   PT - Assessment/Plan  PT Plan Current plan remains appropriate  PT Visit Diagnosis Difficulty in walking, not elsewhere classified (R26.2)  PT Frequency (ACUTE ONLY) 7X/week  Follow Up Recommendations Follow surgeon's recommendation for DC plan and follow-up therapies  PT equipment None recommended by PT  AM-PAC PT "6 Clicks" Mobility Outcome Measure (Version 2)  Help needed turning from your back to your side while in a flat bed without using bedrails? 4  Help needed moving from lying on your back to sitting on the side of a flat bed without using bedrails? 4  Help needed moving to and from a bed to a chair (including a wheelchair)? 3  Help needed standing up from a chair using your arms (e.g., wheelchair or bedside chair)? 3  Help needed to walk in hospital room? 3  Help needed climbing 3-5 steps with a railing?  3  6 Click Score 20  Consider Recommendation of Discharge To: Home with no services  PT Goal Progression  Progress towards PT goals Progressing toward goals  Acute Rehab PT Goals  PT Goal Formulation With patient  Time For Goal Achievement 09/24/20  Potential to Achieve Goals Good  PT Time Calculation  PT Start Time (ACUTE ONLY) 1410  PT Stop Time (ACUTE ONLY) 1427  PT Time Calculation (min) (ACUTE ONLY) 17 min  PT General Charges  $$ ACUTE PT VISIT 1 Visit  PT Treatments  $Gait Training  8-22 mins

## 2020-09-19 NOTE — Progress Notes (Signed)
Patient discharged to home w/ spouse. Given all belongings, instructions. Verbalized understanding of instructions. Escorted to pov via w/c. 

## 2020-09-19 NOTE — Plan of Care (Signed)
Plan of care reviewed and discussed with the patient. 

## 2020-09-24 NOTE — Discharge Summary (Signed)
Physician Discharge Summary   Patient ID: [name] MRN: [id number] DOB/AGE: 03-03-1956 65 y.o.  Admit date: 09/18/2020 Discharge date: 09/19/2020  Primary Diagnosis: Right knee osteoarthritis.   Admission Diagnoses:  Past Medical History:  Diagnosis Date  . Arthritis    RIGHT KNEE  . Asthma   . Chronic back pain   . DDD (degenerative disc disease), lumbar   . Diabetes mellitus type 2, diet-controlled (HCC)    pt denies   . Fibromyalgia   . GERD (gastroesophageal reflux disease)   . Hemorrhoids    INTERNAL AND EXTERNAL  . History of colon polyps    2009-- BENIGN  . Hyperlipidemia   . Hypertension   . PONV (postoperative nausea and vomiting)   . Right knee meniscal tear    Discharge Diagnoses:   Active Problems:   S/P total knee arthroplasty, right  Estimated body mass index is 31.64 kg/m as calculated from the following:   Height as of this encounter: 5\' 6"  (1.676 m).   Weight as of this encounter: 88.9 kg.  Procedure:  Procedure(s) (LRB): TOTAL KNEE ARTHROPLASTY (Right)   Consults: None  HPI: [name] is a 65 y.o. female patient of   mine.  The patient had been seen, evaluated, and treated for months conservatively in the   office with medication, activity modification, and injections.  The patient had   radiographic changes of bone-on-bone arthritis with endplate sclerosis and osteophytes noted.  Based on the radiographic changes and failed conservative measures, the patient   decided to proceed with definitive treatment, total knee replacement.  Risks of infection, DVT, component failure, need for revision surgery, neurovascular injury were reviewed in the office setting.  The postop course was reviewed stressing the efforts to maximize post-operative satisfaction and function.  Consent was obtained for benefit of pain   relief.   Laboratory Data: Admission on 09/18/2020, Discharged on 09/19/2020  Component Date Value Ref Range Status  . Sodium  09/18/2020 139  135 - 145 mmol/L Final  . Potassium 09/18/2020 3.8  3.5 - 5.1 mmol/L Final  . Chloride 09/18/2020 102  98 - 111 mmol/L Final  . CO2 09/18/2020 20* 22 - 32 mmol/L Final  . Glucose, Bld 09/18/2020 141* 70 - 99 mg/dL Final   Glucose reference range applies only to samples taken after fasting for at least 8 hours.  . BUN 09/18/2020 18  8 - 23 mg/dL Final  . Creatinine, Ser 09/18/2020 1.20* 0.44 - 1.00 mg/dL Final  . Calcium 09/20/2020 9.5  8.9 - 10.3 mg/dL Final  . GFR, Estimated 09/18/2020 50* >60 mL/min Final   Comment: (NOTE) Calculated using the CKD-EPI Creatinine Equation (2021)   . Anion gap 09/18/2020 17* 5 - 15 Final   Performed at [facility], [address] [place], [place]  . WBC 09/19/2020 12.2* 4.0 - 10.5 K/uL Final  . RBC 09/19/2020 3.47* 3.87 - 5.11 MIL/uL Final  . Hemoglobin 09/19/2020 10.7* 12.0 - 15.0 g/dL Final  . HCT 09/21/2020 33.3* 36.0 - 46.0 % Final  . MCV 09/19/2020 96.0  80.0 - 100.0 fL Final  . MCH 09/19/2020 30.8  26.0 - 34.0 pg Final  . MCHC 09/19/2020 32.1  30.0 - 36.0 g/dL Final  . RDW 09/21/2020 12.0  11.5 - 15.5 % Final  . Platelets 09/19/2020 175  150 - 400 K/uL Final  . nRBC 09/19/2020 0.0  0.0 - 0.2 % Final   Performed at [facility],  [address] [place], [place] [address]  . Sodium 09/19/2020 137  135 - 145 mmol/L Final  . Potassium 09/19/2020 3.9  3.5 - 5.1 mmol/L Final  . Chloride 09/19/2020 104  98 - 111 mmol/L Final  . CO2 09/19/2020 25  22 - 32 mmol/L Final  . Glucose, Bld 09/19/2020 130* 70 - 99 mg/dL Final   Glucose reference range applies only to samples taken after fasting for at least 8 hours.  . BUN 09/19/2020 19  8 - 23 mg/dL Final  . Creatinine, Ser 09/19/2020 0.97  0.44 - 1.00 mg/dL Final  . Calcium 19/14/7829 8.9  8.9 - 10.3 mg/dL Final  . GFR, Estimated 09/19/2020 >60  >60 mL/min Final   Comment: (NOTE) Calculated using the CKD-EPI Creatinine Equation (2021)    . Anion gap 09/19/2020 8  5 - 15 Final   Performed at [facility], [address], [place], [place] [address]  Hospital Outpatient Visit on 09/17/2020  Component Date Value Ref Range Status  . SARS Coronavirus 2 09/17/2020 NEGATIVE  NEGATIVE Final   Comment: (NOTE) SARS-CoV-2 target nucleic acids are NOT DETECTED.  The SARS-CoV-2 RNA is generally detectable in upper and lower respiratory specimens during the acute phase of infection. Negative results do not preclude SARS-CoV-2 infection, do not rule out co-infections with other pathogens, and should not be used as the sole basis for treatment or other patient management decisions. Negative results must be combined with clinical observations, patient history, and epidemiological information. The expected result is Negative.  Fact Sheet for Patients: HairSlick.no  Fact Sheet for Healthcare Providers: quierodirigir.com  This test is not yet approved or cleared by the [place] FDA and  has been authorized for detection and/or diagnosis of SARS-CoV-2 by FDA under an Emergency Use Authorization (EUA). This EUA will remain  in effect (meaning this test can be used) for the duration of the COVID-19 declaration under Se                          ction 564(b)(1) of the Act, 21 U.S.C. section 360bbb-3(b)(1), unless the authorization is terminated or revoked sooner.  Performed at [facility], [address] [place], [place] [address]   Hospital Outpatient Visit on 09/07/2020  Component Date Value Ref Range Status  . WBC 09/07/2020 6.9  4.0 - 10.5 K/uL Final  . RBC 09/07/2020 3.93  3.87 - 5.11 MIL/uL Final  . Hemoglobin 09/07/2020 12.1  12.0 - 15.0 g/dL Final  . HCT 84/69/6295 37.3  36.0 - 46.0 % Final  . MCV 09/07/2020 94.9  80.0 - 100.0 fL Final  . MCH 09/07/2020 30.8  26.0 - 34.0 pg Final  . MCHC 09/07/2020 32.4  30.0 - 36.0 g/dL Final  . RDW  28/41/3244 11.8  11.5 - 15.5 % Final  . Platelets 09/07/2020 194  150 - 400 K/uL Final  . nRBC 09/07/2020 0.0  0.0 - 0.2 % Final   Performed at [facility], [address], [place], [place] [address]  . ABO/RH(D) 09/07/2020 O POS   Final  . Antibody Screen 09/07/2020 NEG   Final  . Sample Expiration 09/07/2020 09/21/2020,2359   Final  . Extend sample reason 09/07/2020    Final                   Value:NO TRANSFUSIONS OR PREGNANCY IN THE PAST 3 MONTHS Performed at [facility], [address] [place], [place] [address]   . MRSA, PCR 09/07/2020 NEGATIVE  NEGATIVE Final  . Staphylococcus aureus 09/07/2020 NEGATIVE  NEGATIVE Final   Comment: (NOTE) The Xpert SA Assay (FDA approved for NASAL specimens in patients 57 years of age and older), is one component of a comprehensive surveillance program. It is not intended to diagnose infection nor to guide or monitor treatment. Performed at [facility], [address], [place], [place] [address]      X-Rays:No results found.  EKG: Orders placed or performed in visit on 09/07/20  . EKG 12-Lead  . EKG 12-Lead  . EKG 12-Lead     Hospital Course: [name] is a 65 y.o. who was admitted to [facility]. They were brought to the operating room on 09/18/2020 and underwent Procedure(s): TOTAL KNEE ARTHROPLASTY.  Patient tolerated the procedure well and was later transferred to the recovery room and then to the orthopaedic floor for postoperative care. They were given PO and IV analgesics for pain control following their surgery. They were given 24 hours of postoperative antibiotics of  Anti-infectives (From admission, onward)   Start     Dose/Rate Route Frequency Ordered Stop   09/18/20 1530  ceFAZolin (ANCEF) IVPB 2g/100 mL premix        2 g 200 mL/hr over 30 Minutes Intravenous Every 6 hours 09/18/20 1321 09/18/20 2154   09/18/20 0745  ceFAZolin (ANCEF) IVPB 2g/100 mL  premix        2 g 200 mL/hr over 30 Minutes Intravenous On call to O.R. 09/18/20 0739 09/18/20 1009     and started on DVT prophylaxis in the form of Aspirin.   PT and OT were ordered for total joint protocol. Discharge planning consulted to help with postop disposition and equipment needs.  Patient had a good night on the evening of surgery. They started to get up OOB with therapy on POD #0. Pt was seen during rounds and was ready to go home pending progress with therapy.  She worked with therapy on POD #1 and was meeting her goals. Pt was discharged to home later that day in stable condition.  Diet: Regular diet Activity: WBAT Follow-up: in 2 weeks Disposition: Home Discharged Condition: good   Discharge Instructions    Call MD / Call 911   Complete by: As directed    If you experience chest pain or shortness of breath, CALL 911 and be transported to the hospital emergency room.  If you develope a fever above 101 F, pus (white drainage) or increased drainage or redness at the wound, or calf pain, call your surgeon's office.   Change dressing   Complete by: As directed    Maintain surgical dressing until follow up in the clinic. If the edges start to pull up, may reinforce with tape. If the dressing is no longer working, may remove and cover with gauze and tape, but must keep the area dry and clean.  Call with any questions or concerns.   Constipation Prevention   Complete by: As directed    Drink plenty of fluids.  Prune juice may be helpful.  You may use a stool softener, such as Colace (over the counter) 100 mg twice a day.  Use MiraLax (over the counter) for constipation as needed.   Diet - low sodium heart healthy   Complete by: As directed    Discharge instructions   Complete by: As directed    Maintain surgical dressing until follow  up in the clinic. If the edges start to pull up, may reinforce with tape. If the dressing is no longer working, may remove and cover with gauze and  tape, but must keep the area dry and clean.  Follow up in 2 weeks at [facility]. Call with any questions or concerns.   Increase activity slowly as tolerated   Complete by: As directed    Weight bearing as tolerated with assist device (walker, cane, etc) as directed, use it as long as suggested by your surgeon or therapist, typically at least 4-6 weeks.   TED hose   Complete by: As directed    Use stockings (TED hose) for 2 weeks on both leg(s).  You may remove them at night for sleeping.     Allergies as of 09/19/2020      Reactions   Macrobid [nitrofurantoin Macrocrystal]    GI upset    Seasonal Ic [cholestatin]    Flexeril [cyclobenzaprine] Other (See Comments)   "out of it "   Penicillins Diarrhea   Tolerated Cephalosporin 09/18/20.   Percocet [oxycodone-acetaminophen] Nausea And Vomiting   Zanaflex [tizanidine Hcl] Other (See Comments)   hallucinations       Medication List    STOP taking these medications   HYDROcodone-acetaminophen 7.5-325 MG tablet Commonly known as: NORCO     TAKE these medications   albuterol 108 (90 Base) MCG/ACT inhaler Commonly known as: VENTOLIN HFA Inhale 2 puffs into the lungs every 6 (six) hours as needed for wheezing or shortness of breath.   aspirin 81 MG chewable tablet Chew 1 tablet (81 mg total) by mouth 2 (two) times daily for 28 days.   atorvastatin 40 MG tablet Commonly known as: LIPITOR Take 40 mg by mouth every evening.   budesonide-formoterol 80-4.5 MCG/ACT inhaler Commonly known as: SYMBICORT Inhale 2 puffs into the lungs 2 (two) times daily as needed (Asthma).   CALCIUM 600/VITAMIN D3 PO Take 1 tablet by mouth daily.   ferrous sulfate 325 (65 FE) MG tablet Take 1 tablet (325 mg total) by mouth 2 (two) times daily with a meal for 14 days.   gabapentin 300 MG capsule Commonly known as: NEURONTIN Take 300 mg by mouth at bedtime.   hydrochlorothiazide 25 MG tablet Commonly known as: HYDRODIURIL Take 25 mg by mouth  daily.   HYDROmorphone 2 MG tablet Commonly known as: DILAUDID Take 1-2 tablets (2-4 mg total) by mouth every 4 (four) hours as needed for severe pain or moderate pain.   lisinopril 40 MG tablet Commonly known as: ZESTRIL Take 40 mg by mouth daily.   Melatonin 10 MG Tabs Take 10 mg by mouth at bedtime.   meloxicam 15 MG tablet Commonly known as: MOBIC Take 1 tablet (15 mg total) by mouth daily.   methocarbamol 500 MG tablet Commonly known as: ROBAXIN Take 1 tablet (500 mg total) by mouth every 6 (six) hours as needed for muscle spasms.   MULTIVITAMIN PO Take 1 tablet by mouth daily.   pantoprazole 40 MG tablet Commonly known as: PROTONIX Take 40 mg by mouth 2 (two) times daily before a meal.   traZODone 50 MG tablet Commonly known as: DESYREL TAKE 1 TABLET(50 MG) BY MOUTH AT BEDTIME AS NEEDED What changed: See the new instructions.   Vitamin D3 125 MCG (5000 UT) Caps Take 5,000 Units by mouth daily.            Discharge Care Instructions  (From admission, onward)  Start     Ordered   09/19/20 0000  Change dressing       Comments: Maintain surgical dressing until follow up in the clinic. If the edges start to pull up, may reinforce with tape. If the dressing is no longer working, may remove and cover with gauze and tape, but must keep the area dry and clean.  Call with any questions or concerns.   09/19/20 1247          Follow-up Information    Paralee Cancel, MD. Schedule an appointment as soon as possible for a visit in 2 weeks.   Specialty: Orthopedic Surgery Contact information: [address] [place] [address] [phone]               Signed: Griffith Citron, PA-C Orthopedic Surgery 09/24/2020, 8:45 AM

## 2020-09-29 ENCOUNTER — Other Ambulatory Visit: Payer: Self-pay | Admitting: Rheumatology

## 2020-10-01 NOTE — Telephone Encounter (Signed)
Last Visit: 01/19/2020  Next Visit: [facility] for patient and message sent to front desk to schedule appt. Return in about 6 months (around 07/20/2020) for Fibromyalgia, DDD.  Current Dose per office note on 01/19/2020, trazodone 50 mg by mouth at bedtime to help her sleep Dx: Fibromyalgia:  Okay to refill Trazadone?

## 2020-10-01 NOTE — Telephone Encounter (Signed)
Please call patient to schedule appt, LMOM for patient to call office to schedule appt,  Return in about 6 months (around 07/20/2020) for Fibromyalgia, DDD

## 2020-10-17 ENCOUNTER — Telehealth: Payer: Self-pay | Admitting: Rheumatology

## 2020-10-18 NOTE — Telephone Encounter (Signed)
Opened in error

## 2020-10-22 NOTE — Progress Notes (Signed)
Virtual Visit via telephone Note  Location: Patient: Home  Provider: Clinic  This service was conducted via virtual visit.  The patient was located at home. I was located in my office.  Consent was obtained prior to the virtual visit and is aware of possible charges through their insurance for this visit.  The patient is an established patient.  Dr. Corliss Skains, MD conducted the virtual visit and Sherron Ales, PA-C acted as scribe during the service.  Office staff helped with scheduling follow up visits after the service was conducted.     I discussed the limitations of evaluation and management by telemedicine and the availability of in person appointments. The patient expressed understanding and agreed to proceed.  CC: Right knee joint pain  History of Present Illness: Patient is a 65 year old female with a past medical history of fibromyalgia and osteoarthritis.  She had her right knee replaced on 09/18/20 by Dr. Charlann Boxer. She continues to go to physical therapy twice weekly and recovering well.  She states her left knee joint is doing well.  She denies any joint swelling.  She continues to have some persistent lower back, likely exacerbated by using a walker.  She continues to see Dr. Noel Gerold for management of her back pain. She has intermittent discomfort due to trochanteric bursitis of both hips. She has occasional myalgias due to fibromyalgia flares.  She had trigger point injections at her last office on 01/19/20.  She has persistent fatigue secondary to insomnia.  She states after surgery her insomnia worsened but the trazodone and melatonin seem to be starting to work again.   Review of Systems  Constitutional: Positive for malaise/fatigue.  HENT: Negative for congestion.   Eyes: Negative for redness.  Respiratory: Negative for shortness of breath.   Cardiovascular: Positive for leg swelling.  Gastrointestinal: Positive for constipation.  Genitourinary: Negative for urgency.  Musculoskeletal:  Positive for back pain, joint pain, myalgias and neck pain.  Skin: Positive for rash.  Neurological: Negative for weakness.  Endo/Heme/Allergies: Bruises/bleeds easily.  Psychiatric/Behavioral: The patient has insomnia.       Observations/Objective: Physical Exam Neurological:     Mental Status: She is alert and oriented to person, place, and time.  Psychiatric:        Mood and Affect: Mood and affect normal.        Cognition and Memory: Memory normal.        Judgment: Judgment normal.    Patient reports morning stiffness for 45 minutes.   Patient denies nocturnal pain.  Difficulty dressing/grooming: Denies Difficulty climbing stairs: Denies Difficulty getting out of chair: Reports Difficulty using hands for taps, buttons, cutlery, and/or writing: Reports   Assessment and Plan: Visit Diagnoses: Fibromyalgia: She has occasional myalgias and muscle tenderness due to fibromyalgia.  She has not had any recent flares.  She has occasional trapezius muscle tension and tenderness.  She had trigger point injections performed on 01/19/2020 which divided significant relief.  She experiences occasional discomfort due to trochanter bursitis of both hips which has been exacerbated by going to physical therapy after her right knee was replaced on 09/18/2020.  We discussed the importance of performing stretching exercises on a daily basis.  She continues to have chronic fatigue secondary to insomnia.  She takes trazodone 50 mg 1 tablet by mouth at bedtime and melatonin 5 mg a mouth at bedtime to help her sleep.  We discussed the importance of  good sleep hygiene.  She will follow-up in the office in  6 months.    Greater trochanteric bursitis of both hips: She has occasional discomfort due to trochanter bursitis of both hips.  Her discomfort has been exacerbated by certain exercises at physical therapy.  She was encouraged to perform stretching exercises on a daily basis.    Trapezius muscle spasm:   She has occasional muscle tension and muscle tenderness in her trapezius muscles bilaterally.  She had trigger point injections performed on 01/19/2020 which provided significant relief.  Primary osteoarthritis of left knee: Her right knee joint pain has improved since starting physical therapy.  Status post right knee replacement: Performed on 09/18/20 by Dr. Charlann Boxer. Doing well. She is currently going to physical therapy twice a week and continues to use a walker to assist with ambulation. She has been progressing well in physical therapy and her pain has been tolerable overall.   DDD (degenerative disc disease), lumbar: She continues to see Dr. Noel Gerold for management of her lower back pain.  Her discomfort has been exacerbated by using a walker to assist with ambulation.   Plantar fasciitis of left foot - Resolved   Trigger finger, right ring finger - Resolved. Injection by Dr. Amanda Pea in the past.  Chronic fatigue: She has chronic fatigue secondary to insomnia.  Discussed the importance of good sleep hygiene and regular exercise.   Other insomnia: She takes trazodone 50 mg 1 tablet by mouth at bedtime and melatonin 5 mg po at bedtime for insomnia.   Other medical conditions are listed as follows:   History of neuropathy   Pitting edema  Other medical conditions are listed as follows:  History of gastroesophageal reflux (GERD)  History of hypercholesterolemia  History of asthma  History of hypertension Follow Up Instructions: She will follow up in 3-4 months.    I discussed the assessment and treatment plan with the patient. The patient was provided an opportunity to ask questions and all were answered. The patient agreed with the plan and demonstrated an understanding of the instructions.   The patient was advised to call back or seek an in-person evaluation if the symptoms worsen or if the condition fails to improve as anticipated.  I provided 20 minutes of  non-face-to-face time during this encounter.  Scribed bySherron Ales, PA-C  I reviewed the above note and I attest to the accuracy of the document.   Pollyann Savoy, MD

## 2020-10-23 ENCOUNTER — Encounter: Payer: Self-pay | Admitting: Rheumatology

## 2020-10-23 ENCOUNTER — Other Ambulatory Visit: Payer: Self-pay

## 2020-10-23 ENCOUNTER — Telehealth (INDEPENDENT_AMBULATORY_CARE_PROVIDER_SITE_OTHER): Payer: Medicare Other | Admitting: Rheumatology

## 2020-10-23 VITALS — Ht 66.0 in

## 2020-10-23 DIAGNOSIS — Z8709 Personal history of other diseases of the respiratory system: Secondary | ICD-10-CM

## 2020-10-23 DIAGNOSIS — Z8679 Personal history of other diseases of the circulatory system: Secondary | ICD-10-CM

## 2020-10-23 DIAGNOSIS — M65341 Trigger finger, right ring finger: Secondary | ICD-10-CM

## 2020-10-23 DIAGNOSIS — M7062 Trochanteric bursitis, left hip: Secondary | ICD-10-CM

## 2020-10-23 DIAGNOSIS — M7061 Trochanteric bursitis, right hip: Secondary | ICD-10-CM

## 2020-10-23 DIAGNOSIS — Z8639 Personal history of other endocrine, nutritional and metabolic disease: Secondary | ICD-10-CM

## 2020-10-23 DIAGNOSIS — M1712 Unilateral primary osteoarthritis, left knee: Secondary | ICD-10-CM

## 2020-10-23 DIAGNOSIS — Z8669 Personal history of other diseases of the nervous system and sense organs: Secondary | ICD-10-CM

## 2020-10-23 DIAGNOSIS — M62838 Other muscle spasm: Secondary | ICD-10-CM

## 2020-10-23 DIAGNOSIS — R609 Edema, unspecified: Secondary | ICD-10-CM

## 2020-10-23 DIAGNOSIS — M722 Plantar fascial fibromatosis: Secondary | ICD-10-CM

## 2020-10-23 DIAGNOSIS — M170 Bilateral primary osteoarthritis of knee: Secondary | ICD-10-CM

## 2020-10-23 DIAGNOSIS — Z8719 Personal history of other diseases of the digestive system: Secondary | ICD-10-CM

## 2020-10-23 DIAGNOSIS — G4709 Other insomnia: Secondary | ICD-10-CM

## 2020-10-23 DIAGNOSIS — R5382 Chronic fatigue, unspecified: Secondary | ICD-10-CM

## 2020-10-23 DIAGNOSIS — M797 Fibromyalgia: Secondary | ICD-10-CM

## 2020-10-23 DIAGNOSIS — Z96651 Presence of right artificial knee joint: Secondary | ICD-10-CM

## 2020-10-23 DIAGNOSIS — M5136 Other intervertebral disc degeneration, lumbar region: Secondary | ICD-10-CM

## 2020-10-24 ENCOUNTER — Other Ambulatory Visit: Payer: Self-pay

## 2020-10-24 ENCOUNTER — Telehealth: Payer: Self-pay | Admitting: Rheumatology

## 2020-10-24 NOTE — Telephone Encounter (Signed)
I LMOM for patient to call back to schedule a follow up appointment with Dr. Corliss Skains, for around 01/21/2021.

## 2020-10-24 NOTE — Telephone Encounter (Signed)
Patient had a virtual visit on 10/23/2020 and per Sherron Ales, PA-C, please refill trazodone next week. Thanks!

## 2020-10-29 MED ORDER — TRAZODONE HCL 50 MG PO TABS
50.0000 mg | ORAL_TABLET | Freq: Every day | ORAL | 0 refills | Status: DC
Start: 2020-10-29 — End: 2020-11-27

## 2020-10-29 NOTE — Telephone Encounter (Signed)
Last Visit: 10/23/2020 Next Visit: due May/June 2022. Message sent to the front to schedule.   Current Dose per office note on 10/23/2020: trazodone 50 mg 1 tablet by mouth at bedtime Dx: Other insomnia  Last Fill: 10/01/2020  Okay to refill Trazodone?

## 2020-10-29 NOTE — Telephone Encounter (Signed)
Please schedule patient for a follow up visit. Patient due May/June 2022. Thanks!

## 2020-10-29 NOTE — Telephone Encounter (Signed)
I left a second message for patient to call, and schedule a follow up appointment  For 01/2021.

## 2020-11-27 ENCOUNTER — Other Ambulatory Visit: Payer: Self-pay | Admitting: Rheumatology

## 2020-11-27 NOTE — Telephone Encounter (Signed)
Last Visit: 10/23/2020 Next Visit: 01/23/2021  Current Dose per office note on 10/23/2020, trazodone 50 mg 1 tablet by mouth at bedtime Dx: Fibromyalgia  Last Fill: 10/29/2020  Okay to refill Trazadone?

## 2020-12-25 ENCOUNTER — Other Ambulatory Visit: Payer: Self-pay | Admitting: Physician Assistant

## 2020-12-26 NOTE — Telephone Encounter (Signed)
Next Visit: 01/23/2021  Last Visit: 10/23/2020  Last Fill: 11/27/2020  Dx: Other insomnia  Current Dose per office note on 10/23/2020, trazodone 50 mg 1 tablet by mouth at bedtime  Okay to refill Trazadone?

## 2021-01-09 NOTE — Progress Notes (Signed)
Office Visit Note  Patient: [name]             Date of Birth: 09/18/1956           MRN: [id number]             PCP: Lewis Moccasinewey, Elizabeth R, MD Referring: Lewis Moccasinewey, Elizabeth R, MD Visit Date: 01/23/2021 Occupation: @GUAROCC @  Subjective:  Generalized pain.   History of Present Illness: [name] is a 65 y.o. female with history of fibromyalgia, and osteoarthritis.  She has been having flare of fibromyalgia off and on for the last few months.  She states she has been not been sleeping well.  She has been experiencing increased fatigue and generalized pain.  She states her pain from fibromyalgia on 0-10 has been about 7.  She has been going to pain management.  She underwent right total knee replacement in December 2022.  She states she is gradually recovering from that and using a cane.  She has been also diagnosed with hammertoe and bunion.  She is seeing a podiatrist.  The right trigger fingers are still bothersome.  She was offered surgery by Dr. Amanda PeaGramig but she is planning to postpone for right now.  She had been experiencing increased lower back pain and was given a prednisone taper by Dr. Noel Geroldohen.  She continues to have bilateral trapezius spasm and also pain in the trochanteric bursa.  Activities of Daily Living:  Patient reports morning stiffness for 30 minutes.   Patient Reports nocturnal pain.  Difficulty dressing/grooming: Denies Difficulty climbing stairs: Denies Difficulty getting out of chair: Denies Difficulty using hands for taps, buttons, cutlery, and/or writing: Reports  Review of Systems  Constitutional: Positive for fatigue.  HENT: Negative for mouth sores, mouth dryness and nose dryness.   Eyes: Positive for itching. Negative for pain, visual disturbance and dryness.  Respiratory: Positive for cough. Negative for hemoptysis, shortness of breath and difficulty breathing.   Cardiovascular: Negative for chest pain, palpitations and swelling in legs/feet.   Gastrointestinal: Positive for constipation. Negative for abdominal pain, blood in stool and diarrhea.  Endocrine: Negative for increased urination.  Genitourinary: Negative for painful urination.  Musculoskeletal: Positive for arthralgias, joint pain and morning stiffness. Negative for joint swelling, myalgias, muscle weakness, muscle tenderness and myalgias.  Skin: Negative for color change, rash and redness.  Allergic/Immunologic: Negative for susceptible to infections.  Neurological: Positive for numbness and memory loss. Negative for dizziness, headaches and weakness.  Hematological: Negative for swollen glands.  Psychiatric/Behavioral: Positive for sleep disturbance. Negative for confusion.    PMFS History:  Patient Active Problem List   Diagnosis Date Noted  . S/P total knee arthroplasty, right 09/18/2020  . Benign hypertension 08/02/2018  . Chronic back pain 08/02/2018  . Diabetes mellitus (HCC) 08/02/2018  . Hyperlipidemia 08/02/2018  . Plantar fasciitis 07/11/2018  . Acquired trigger finger 03/17/2018  . Primary osteoarthritis of both knees 11/16/2017  . DDD (degenerative disc disease), lumbar 11/16/2017  . History of asthma 11/16/2017  . History of gastroesophageal reflux (GERD) 11/16/2017  . History of hypertension 11/16/2017  . History of neuropathy 11/16/2017  . History of hypercholesterolemia 11/16/2017  . Mass of hand 11/09/2017  . Pain in right hand 11/09/2017  . Trigger finger 11/09/2017  . Fibromyalgia 07/26/2016  . Greater trochanteric bursitis of both hips 07/26/2016  . Chronic fatigue 07/26/2016  . Other insomnia 07/26/2016  . Rectal bleeding 01/31/2014    Past Medical History:  Diagnosis Date  . Arthritis  RIGHT KNEE  . Asthma   . Chronic back pain   . DDD (degenerative disc disease), lumbar   . Diabetes mellitus type 2, diet-controlled (HCC)    pt denies   . Fibromyalgia   . GERD (gastroesophageal reflux disease)   . Hemorrhoids    INTERNAL  AND EXTERNAL  . History of colon polyps    2009-- BENIGN  . Hyperlipidemia   . Hypertension   . PONV (postoperative nausea and vomiting)   . Right knee meniscal tear     Family History  Problem Relation Age of Onset  . Heart disease Mother   . Heart attack Mother   . Arthritis Father   . Diabetes Sister   . Diabetes Brother   . Hypertension Brother   . Stomach cancer Brother    Past Surgical History:  Procedure Laterality Date  . CARDIOVASCULAR STRESS TEST  07-06-2013   no ischemia or infarct/  normal LV function and wall motion , ef 63%  . COLONOSCOPY W/ POLYPECTOMY  01-10-2008  . KNEE ARTHROSCOPY Right 10-14-2006  . KNEE ARTHROSCOPY WITH LATERAL MENISECTOMY Right 05/18/2015   Procedure: KNEE ARTHROSCOPY WITH PARTIAL  LATERAL MENISECTOMY;  Surgeon: Durene Romans, MD;  Location: [facility];  Service: Orthopedics;  Laterality: Right;  . KNEE ARTHROSCOPY WITH MEDIAL MENISECTOMY Right 05/18/2015   Procedure: KNEE ARTHROSCOPY WITH PARTIAL  MEDIAL MENISECTOMY;  Surgeon: Durene Romans, MD;  Location: [facility];  Service: Orthopedics;  Laterality: Right;  . LIPOMA EXCISION Right 01/2018   right thumb lipoma   . PULLEY RELEASE RIGHT LONG AND SMALL FINGERS  01-30-2006  . RECONSTRUCTION OF ELBOW Bilateral right 06-03-2005  &  left  06-12-2011  . ROBOTIC ASSITED PARTIAL NEPHRECTOMY Left 10-16-2008   oncocytoma ( negative neoplasm)  . SHOULDER ARTHROSCOPY W/ SUBACROMIAL DECOMPRESSION AND DISTAL CLAVICLE EXCISION Left 07-07-2007   and ROTATOR CUFF REPAIR  . SIGMOIDOSCOPY  02-02-2014  . TOTAL ABDOMINAL HYSTERECTOMY W/ BILATERAL SALPINGOOPHORECTOMY  1979  . TOTAL KNEE ARTHROPLASTY Right 09/18/2020   Procedure: TOTAL KNEE ARTHROPLASTY;  Surgeon: Durene Romans, MD;  Location: [facility];  Service: Orthopedics;  Laterality: Right;  70 mins   Social History   Social History Narrative  . Not on file   Immunization History  Administered Date(s) Administered  .  Influenza,inj,Quad PF,6+ Mos 06/30/2018  . Moderna Sars-Covid-2 Vaccination 10/24/2019, 11/21/2019, 06/25/2020     Objective: Vital Signs: BP 124/84 (BP Location: Left Arm, Patient Position: Sitting, Cuff Size: Normal)   Pulse 78   Ht 5\' 6"  (1.676 m)   Wt 176 lb (79.8 kg)   BMI 28.41 kg/m    Physical Exam Vitals and nursing note reviewed.  Constitutional:      Appearance: She is well-developed.  HENT:     Head: Normocephalic and atraumatic.  Eyes:     Conjunctiva/sclera: Conjunctivae normal.  Cardiovascular:     Rate and Rhythm: Normal rate and regular rhythm.     Heart sounds: Normal heart sounds.  Pulmonary:     Effort: Pulmonary effort is normal.     Breath sounds: Normal breath sounds.  Abdominal:     General: Bowel sounds are normal.     Palpations: Abdomen is soft.  Musculoskeletal:     Cervical back: Normal range of motion.  Lymphadenopathy:     Cervical: No cervical adenopathy.  Skin:    General: Skin is warm and dry.     Capillary Refill: Capillary refill takes less than 2 seconds.  Neurological:  Mental Status: She is alert and oriented to person, place, and time.  Psychiatric:        Behavior: Behavior normal.      Musculoskeletal Exam: C-spine was in good range of motion.  She had discomfort and pain with range of motion of lumbar spine.  She had bilateral trapezius spasm.  Shoulder joints, elbow joints, wrist joints with good range of motion.  She had right ring trigger finger.  No synovitis was noted over MCPs or PIPs.  Hip joints and left knee joint was in good range of motion.  Right knee joint is replaced and had warmth and limited extension.  There was no tenderness over ankles or MTPs.  She had prominence of MCPs PIPs and DIPs consistent with osteoarthritis.  CDAI Exam: CDAI Score: -- Patient Global: --; Provider Global: -- Swollen: --; Tender: -- Joint Exam 01/23/2021   No joint exam has been documented for this visit   There is currently no  information documented on the homunculus. Go to the Rheumatology activity and complete the homunculus joint exam.  Investigation: No additional findings.  Imaging: No results found.  Recent Labs: Lab Results  Component Value Date   WBC 12.2 (H) 09/19/2020   HGB 10.7 (L) 09/19/2020   PLT 175 09/19/2020   NA 137 09/19/2020   K 3.9 09/19/2020   CL 104 09/19/2020   CO2 25 09/19/2020   GLUCOSE 130 (H) 09/19/2020   BUN 19 09/19/2020   CREATININE 0.97 09/19/2020   BILITOT 0.3 06/28/2013   ALKPHOS 68 06/28/2013   AST 14 06/28/2013   ALT 11 06/28/2013   PROT 7.6 06/28/2013   ALBUMIN 3.8 06/28/2013   CALCIUM 8.9 09/19/2020   GFRAA >60 05/08/2015    Speciality Comments: No specialty comments available.  Procedures:  Trigger Point Inj  Date/Time: 01/23/2021 11:13 AM Performed by: Pollyann Savoy, MD Authorized by: Pollyann Savoy, MD   Consent Given by:  Patient Site marked: the procedure site was marked   Timeout: prior to procedure the correct patient, procedure, and site was verified   Indications:  Muscle spasm and pain Total # of Trigger Points:  2 Location: neck   Needle Size:  27 G Approach:  Dorsal Medications #1:  0.5 mL lidocaine 1 %; 10 mg triamcinolone acetonide 40 MG/ML Medications #2:  0.5 mL lidocaine 1 %; 10 mg triamcinolone acetonide 40 MG/ML Patient tolerance:  Patient tolerated the procedure well with no immediate complications   Allergies: Dilaudid [hydromorphone], Levofloxacin, Macrobid [nitrofurantoin macrocrystal], Oxycodone-acetaminophen, Penicillin g benzathine, Seasonal ic [cholestatin], Cyclobenzaprine, Penicillins, Percocet [oxycodone-acetaminophen], and Zanaflex [tizanidine hcl]   Assessment / Plan:     Visit Diagnoses: Fibromyalgia-she is having a flare of fibromyalgia with increased pain and discomfort all over.  Need for regular exercise was emphasized.  Although she is having difficulty with exercise due to recent right knee  replacement.  Other insomnia - trazodone 50 mg 1 tablet by mouth at bedtime and melatonin 5 mg po at bedtime for insomnia.  She states she is still having insomnia despite of taking these medications.  Have advised her to discuss treatment options with her PCP.  Chronic fatigue-related to insomnia and fibromyalgia syndrome  Trapezius muscle spasm.-She had discomfort over bilateral trapezius region.  She had tenderness on palpation.  Per patient's request bilateral trapezius area were injected with cortisone as described above.  She tolerated the procedure well.  I have given her a handout on neck exercises.  Greater trochanteric bursitis of both hips-IT band  stretches were discussed.  Status post right knee replacement - Performed on 09/18/20 by Dr. Charlann Boxer.  She still have some warmth and limited extension.  She is going to physical therapy.  Primary osteoarthritis of left knee-doing well.  Trigger finger, right ring finger -she had 3 injections by Dr. Amanda Pea in the past.  She will require surgery in the future.  Use of Voltaren gel was discussed.  DDD (degenerative disc disease), lumbar - Status post fusion.  She is followed by Dr. Noel Gerold.  She continues to have severe lower back pain.  History of hypertension-blood pressure is normal today.  History of hypercholesterolemia  History of asthma  History of gastroesophageal reflux (GERD)  Orders: Orders Placed This Encounter  Procedures  . Trigger Point Inj   No orders of the defined types were placed in this encounter.   Follow-Up Instructions: Return in about 6 months (around 07/26/2021) for FMS, OA.   Pollyann Savoy, MD  Note - This record has been created using Animal nutritionist.  Chart creation errors have been sought, but may not always  have been located. Such creation errors do not reflect on  the standard of medical care.

## 2021-01-22 DIAGNOSIS — J3089 Other allergic rhinitis: Secondary | ICD-10-CM | POA: Diagnosis not present

## 2021-01-22 DIAGNOSIS — J3081 Allergic rhinitis due to animal (cat) (dog) hair and dander: Secondary | ICD-10-CM | POA: Diagnosis not present

## 2021-01-22 DIAGNOSIS — J301 Allergic rhinitis due to pollen: Secondary | ICD-10-CM | POA: Diagnosis not present

## 2021-01-23 ENCOUNTER — Ambulatory Visit: Payer: Medicare Other | Admitting: Rheumatology

## 2021-01-23 ENCOUNTER — Other Ambulatory Visit: Payer: Self-pay

## 2021-01-23 ENCOUNTER — Encounter: Payer: Self-pay | Admitting: Rheumatology

## 2021-01-23 VITALS — BP 124/84 | HR 78 | Ht 66.0 in | Wt 176.0 lb

## 2021-01-23 DIAGNOSIS — R609 Edema, unspecified: Secondary | ICD-10-CM

## 2021-01-23 DIAGNOSIS — M5136 Other intervertebral disc degeneration, lumbar region: Secondary | ICD-10-CM

## 2021-01-23 DIAGNOSIS — Z96651 Presence of right artificial knee joint: Secondary | ICD-10-CM

## 2021-01-23 DIAGNOSIS — Z8709 Personal history of other diseases of the respiratory system: Secondary | ICD-10-CM

## 2021-01-23 DIAGNOSIS — R5382 Chronic fatigue, unspecified: Secondary | ICD-10-CM | POA: Diagnosis not present

## 2021-01-23 DIAGNOSIS — Z8679 Personal history of other diseases of the circulatory system: Secondary | ICD-10-CM

## 2021-01-23 DIAGNOSIS — Z8719 Personal history of other diseases of the digestive system: Secondary | ICD-10-CM

## 2021-01-23 DIAGNOSIS — G4709 Other insomnia: Secondary | ICD-10-CM | POA: Diagnosis not present

## 2021-01-23 DIAGNOSIS — M722 Plantar fascial fibromatosis: Secondary | ICD-10-CM

## 2021-01-23 DIAGNOSIS — M797 Fibromyalgia: Secondary | ICD-10-CM

## 2021-01-23 DIAGNOSIS — M62838 Other muscle spasm: Secondary | ICD-10-CM

## 2021-01-23 DIAGNOSIS — M7062 Trochanteric bursitis, left hip: Secondary | ICD-10-CM

## 2021-01-23 DIAGNOSIS — M1712 Unilateral primary osteoarthritis, left knee: Secondary | ICD-10-CM

## 2021-01-23 DIAGNOSIS — Z8669 Personal history of other diseases of the nervous system and sense organs: Secondary | ICD-10-CM

## 2021-01-23 DIAGNOSIS — M7061 Trochanteric bursitis, right hip: Secondary | ICD-10-CM

## 2021-01-23 DIAGNOSIS — M65341 Trigger finger, right ring finger: Secondary | ICD-10-CM

## 2021-01-23 DIAGNOSIS — Z8639 Personal history of other endocrine, nutritional and metabolic disease: Secondary | ICD-10-CM

## 2021-01-23 MED ORDER — TRIAMCINOLONE ACETONIDE 40 MG/ML IJ SUSP
10.0000 mg | INTRAMUSCULAR | Status: AC | PRN
  Administered 2021-01-23: 10 mg via INTRAMUSCULAR

## 2021-01-23 MED ORDER — LIDOCAINE HCL 1 % IJ SOLN
0.5000 mL | INTRAMUSCULAR | Status: AC | PRN
  Administered 2021-01-23: .5 mL

## 2021-01-23 NOTE — Patient Instructions (Signed)

## 2021-01-28 DIAGNOSIS — M5106 Intervertebral disc disorders with myelopathy, lumbar region: Secondary | ICD-10-CM | POA: Diagnosis not present

## 2021-01-28 DIAGNOSIS — G894 Chronic pain syndrome: Secondary | ICD-10-CM | POA: Diagnosis not present

## 2021-01-29 DIAGNOSIS — M545 Low back pain, unspecified: Secondary | ICD-10-CM | POA: Diagnosis not present

## 2021-01-29 DIAGNOSIS — J3081 Allergic rhinitis due to animal (cat) (dog) hair and dander: Secondary | ICD-10-CM | POA: Diagnosis not present

## 2021-01-29 DIAGNOSIS — J301 Allergic rhinitis due to pollen: Secondary | ICD-10-CM | POA: Diagnosis not present

## 2021-01-29 DIAGNOSIS — J3089 Other allergic rhinitis: Secondary | ICD-10-CM | POA: Diagnosis not present

## 2021-02-05 DIAGNOSIS — E785 Hyperlipidemia, unspecified: Secondary | ICD-10-CM | POA: Diagnosis not present

## 2021-02-05 DIAGNOSIS — E119 Type 2 diabetes mellitus without complications: Secondary | ICD-10-CM | POA: Diagnosis not present

## 2021-02-05 DIAGNOSIS — I959 Hypotension, unspecified: Secondary | ICD-10-CM | POA: Diagnosis not present

## 2021-02-05 DIAGNOSIS — I10 Essential (primary) hypertension: Secondary | ICD-10-CM | POA: Diagnosis not present

## 2021-02-06 DIAGNOSIS — Z23 Encounter for immunization: Secondary | ICD-10-CM | POA: Diagnosis not present

## 2021-02-12 DIAGNOSIS — J3089 Other allergic rhinitis: Secondary | ICD-10-CM | POA: Diagnosis not present

## 2021-02-12 DIAGNOSIS — J301 Allergic rhinitis due to pollen: Secondary | ICD-10-CM | POA: Diagnosis not present

## 2021-02-12 DIAGNOSIS — J3081 Allergic rhinitis due to animal (cat) (dog) hair and dander: Secondary | ICD-10-CM | POA: Diagnosis not present

## 2021-02-19 DIAGNOSIS — J301 Allergic rhinitis due to pollen: Secondary | ICD-10-CM | POA: Diagnosis not present

## 2021-02-19 DIAGNOSIS — J3089 Other allergic rhinitis: Secondary | ICD-10-CM | POA: Diagnosis not present

## 2021-02-19 DIAGNOSIS — J3081 Allergic rhinitis due to animal (cat) (dog) hair and dander: Secondary | ICD-10-CM | POA: Diagnosis not present

## 2021-02-27 DIAGNOSIS — J3081 Allergic rhinitis due to animal (cat) (dog) hair and dander: Secondary | ICD-10-CM | POA: Diagnosis not present

## 2021-02-27 DIAGNOSIS — J301 Allergic rhinitis due to pollen: Secondary | ICD-10-CM | POA: Diagnosis not present

## 2021-02-27 DIAGNOSIS — J3089 Other allergic rhinitis: Secondary | ICD-10-CM | POA: Diagnosis not present

## 2021-03-01 DIAGNOSIS — M7741 Metatarsalgia, right foot: Secondary | ICD-10-CM | POA: Diagnosis not present

## 2021-03-12 DIAGNOSIS — J3081 Allergic rhinitis due to animal (cat) (dog) hair and dander: Secondary | ICD-10-CM | POA: Diagnosis not present

## 2021-03-12 DIAGNOSIS — J301 Allergic rhinitis due to pollen: Secondary | ICD-10-CM | POA: Diagnosis not present

## 2021-03-12 DIAGNOSIS — J3089 Other allergic rhinitis: Secondary | ICD-10-CM | POA: Diagnosis not present

## 2021-03-15 DIAGNOSIS — Z6830 Body mass index (BMI) 30.0-30.9, adult: Secondary | ICD-10-CM | POA: Diagnosis not present

## 2021-03-15 DIAGNOSIS — M5106 Intervertebral disc disorders with myelopathy, lumbar region: Secondary | ICD-10-CM | POA: Diagnosis not present

## 2021-03-15 DIAGNOSIS — G894 Chronic pain syndrome: Secondary | ICD-10-CM | POA: Diagnosis not present

## 2021-03-15 DIAGNOSIS — M4726 Other spondylosis with radiculopathy, lumbar region: Secondary | ICD-10-CM | POA: Diagnosis not present

## 2021-03-20 DIAGNOSIS — J3081 Allergic rhinitis due to animal (cat) (dog) hair and dander: Secondary | ICD-10-CM | POA: Diagnosis not present

## 2021-03-20 DIAGNOSIS — J301 Allergic rhinitis due to pollen: Secondary | ICD-10-CM | POA: Diagnosis not present

## 2021-03-20 DIAGNOSIS — J3089 Other allergic rhinitis: Secondary | ICD-10-CM | POA: Diagnosis not present

## 2021-03-22 DIAGNOSIS — H43812 Vitreous degeneration, left eye: Secondary | ICD-10-CM | POA: Diagnosis not present

## 2021-03-22 DIAGNOSIS — H25813 Combined forms of age-related cataract, bilateral: Secondary | ICD-10-CM | POA: Diagnosis not present

## 2021-03-29 DIAGNOSIS — J3089 Other allergic rhinitis: Secondary | ICD-10-CM | POA: Diagnosis not present

## 2021-03-29 DIAGNOSIS — J301 Allergic rhinitis due to pollen: Secondary | ICD-10-CM | POA: Diagnosis not present

## 2021-03-29 DIAGNOSIS — J3081 Allergic rhinitis due to animal (cat) (dog) hair and dander: Secondary | ICD-10-CM | POA: Diagnosis not present

## 2021-03-31 ENCOUNTER — Other Ambulatory Visit: Payer: Self-pay | Admitting: Physician Assistant

## 2021-04-01 DIAGNOSIS — M5106 Intervertebral disc disorders with myelopathy, lumbar region: Secondary | ICD-10-CM | POA: Diagnosis not present

## 2021-04-01 DIAGNOSIS — G894 Chronic pain syndrome: Secondary | ICD-10-CM | POA: Diagnosis not present

## 2021-04-01 NOTE — Telephone Encounter (Signed)
Next Visit: 07/24/2021  Last Visit: 01/23/2021  Last Fill: 12/26/2020  Dx: Other insomnia  Current Dose per office note on 01/23/2021: trazodone 50 mg 1 tablet by mouth at bedtime  Okay to refill Trazodone?

## 2021-04-04 ENCOUNTER — Other Ambulatory Visit: Payer: Self-pay | Admitting: Orthopaedic Surgery

## 2021-04-04 DIAGNOSIS — M5106 Intervertebral disc disorders with myelopathy, lumbar region: Secondary | ICD-10-CM

## 2021-04-09 DIAGNOSIS — J3089 Other allergic rhinitis: Secondary | ICD-10-CM | POA: Diagnosis not present

## 2021-04-09 DIAGNOSIS — J301 Allergic rhinitis due to pollen: Secondary | ICD-10-CM | POA: Diagnosis not present

## 2021-04-09 DIAGNOSIS — J3081 Allergic rhinitis due to animal (cat) (dog) hair and dander: Secondary | ICD-10-CM | POA: Diagnosis not present

## 2021-04-12 DIAGNOSIS — J301 Allergic rhinitis due to pollen: Secondary | ICD-10-CM | POA: Diagnosis not present

## 2021-04-12 DIAGNOSIS — J3089 Other allergic rhinitis: Secondary | ICD-10-CM | POA: Diagnosis not present

## 2021-04-12 DIAGNOSIS — J3081 Allergic rhinitis due to animal (cat) (dog) hair and dander: Secondary | ICD-10-CM | POA: Diagnosis not present

## 2021-04-14 ENCOUNTER — Ambulatory Visit
Admission: RE | Admit: 2021-04-14 | Discharge: 2021-04-14 | Disposition: A | Discharge status: Home / Self Care | Payer: Medicare Other | Source: Ambulatory Visit | Attending: Orthopaedic Surgery | Admitting: Orthopaedic Surgery

## 2021-04-14 ENCOUNTER — Other Ambulatory Visit: Payer: Medicare Other

## 2021-04-14 DIAGNOSIS — M5127 Other intervertebral disc displacement, lumbosacral region: Secondary | ICD-10-CM | POA: Diagnosis not present

## 2021-04-14 DIAGNOSIS — M5126 Other intervertebral disc displacement, lumbar region: Secondary | ICD-10-CM | POA: Diagnosis not present

## 2021-04-14 DIAGNOSIS — M5106 Intervertebral disc disorders with myelopathy, lumbar region: Secondary | ICD-10-CM

## 2021-04-14 DIAGNOSIS — M4807 Spinal stenosis, lumbosacral region: Secondary | ICD-10-CM | POA: Diagnosis not present

## 2021-04-14 DIAGNOSIS — M48061 Spinal stenosis, lumbar region without neurogenic claudication: Secondary | ICD-10-CM | POA: Diagnosis not present

## 2021-04-14 MED ORDER — GADOBENATE DIMEGLUMINE 529 MG/ML IV SOLN
15.0000 mL | Freq: Once | INTRAVENOUS | Status: AC | PRN
  Administered 2021-04-14: 15 mL via INTRAVENOUS

## 2021-04-19 DIAGNOSIS — J301 Allergic rhinitis due to pollen: Secondary | ICD-10-CM | POA: Diagnosis not present

## 2021-04-19 DIAGNOSIS — J3089 Other allergic rhinitis: Secondary | ICD-10-CM | POA: Diagnosis not present

## 2021-04-19 DIAGNOSIS — J3081 Allergic rhinitis due to animal (cat) (dog) hair and dander: Secondary | ICD-10-CM | POA: Diagnosis not present

## 2021-04-26 DIAGNOSIS — J301 Allergic rhinitis due to pollen: Secondary | ICD-10-CM | POA: Diagnosis not present

## 2021-04-26 DIAGNOSIS — J3081 Allergic rhinitis due to animal (cat) (dog) hair and dander: Secondary | ICD-10-CM | POA: Diagnosis not present

## 2021-04-26 DIAGNOSIS — J3089 Other allergic rhinitis: Secondary | ICD-10-CM | POA: Diagnosis not present

## 2021-04-30 DIAGNOSIS — J3081 Allergic rhinitis due to animal (cat) (dog) hair and dander: Secondary | ICD-10-CM | POA: Diagnosis not present

## 2021-04-30 DIAGNOSIS — J3089 Other allergic rhinitis: Secondary | ICD-10-CM | POA: Diagnosis not present

## 2021-04-30 DIAGNOSIS — J301 Allergic rhinitis due to pollen: Secondary | ICD-10-CM | POA: Diagnosis not present

## 2021-05-02 DIAGNOSIS — M5116 Intervertebral disc disorders with radiculopathy, lumbar region: Secondary | ICD-10-CM | POA: Diagnosis not present

## 2021-05-02 DIAGNOSIS — G894 Chronic pain syndrome: Secondary | ICD-10-CM | POA: Diagnosis not present

## 2021-05-07 DIAGNOSIS — J301 Allergic rhinitis due to pollen: Secondary | ICD-10-CM | POA: Diagnosis not present

## 2021-05-07 DIAGNOSIS — J3081 Allergic rhinitis due to animal (cat) (dog) hair and dander: Secondary | ICD-10-CM | POA: Diagnosis not present

## 2021-05-07 DIAGNOSIS — J3089 Other allergic rhinitis: Secondary | ICD-10-CM | POA: Diagnosis not present

## 2021-05-14 DIAGNOSIS — M5116 Intervertebral disc disorders with radiculopathy, lumbar region: Secondary | ICD-10-CM | POA: Diagnosis not present

## 2021-05-21 DIAGNOSIS — J301 Allergic rhinitis due to pollen: Secondary | ICD-10-CM | POA: Diagnosis not present

## 2021-05-21 DIAGNOSIS — J3081 Allergic rhinitis due to animal (cat) (dog) hair and dander: Secondary | ICD-10-CM | POA: Diagnosis not present

## 2021-05-21 DIAGNOSIS — J3089 Other allergic rhinitis: Secondary | ICD-10-CM | POA: Diagnosis not present

## 2021-05-30 DIAGNOSIS — G894 Chronic pain syndrome: Secondary | ICD-10-CM | POA: Diagnosis not present

## 2021-05-30 DIAGNOSIS — M5116 Intervertebral disc disorders with radiculopathy, lumbar region: Secondary | ICD-10-CM | POA: Diagnosis not present

## 2021-06-03 DIAGNOSIS — J3089 Other allergic rhinitis: Secondary | ICD-10-CM | POA: Diagnosis not present

## 2021-06-03 DIAGNOSIS — J301 Allergic rhinitis due to pollen: Secondary | ICD-10-CM | POA: Diagnosis not present

## 2021-06-03 DIAGNOSIS — J3081 Allergic rhinitis due to animal (cat) (dog) hair and dander: Secondary | ICD-10-CM | POA: Diagnosis not present

## 2021-06-05 DIAGNOSIS — M4716 Other spondylosis with myelopathy, lumbar region: Secondary | ICD-10-CM | POA: Diagnosis not present

## 2021-06-05 DIAGNOSIS — M48062 Spinal stenosis, lumbar region with neurogenic claudication: Secondary | ICD-10-CM | POA: Diagnosis not present

## 2021-06-05 DIAGNOSIS — J45909 Unspecified asthma, uncomplicated: Secondary | ICD-10-CM | POA: Diagnosis not present

## 2021-06-05 DIAGNOSIS — G894 Chronic pain syndrome: Secondary | ICD-10-CM | POA: Diagnosis not present

## 2021-06-05 DIAGNOSIS — M5106 Intervertebral disc disorders with myelopathy, lumbar region: Secondary | ICD-10-CM | POA: Diagnosis not present

## 2021-06-05 DIAGNOSIS — I10 Essential (primary) hypertension: Secondary | ICD-10-CM | POA: Diagnosis not present

## 2021-06-05 DIAGNOSIS — E663 Overweight: Secondary | ICD-10-CM | POA: Diagnosis not present

## 2021-06-05 DIAGNOSIS — M1990 Unspecified osteoarthritis, unspecified site: Secondary | ICD-10-CM | POA: Diagnosis not present

## 2021-06-17 DIAGNOSIS — Z86010 Personal history of colonic polyps: Secondary | ICD-10-CM | POA: Diagnosis not present

## 2021-06-17 DIAGNOSIS — K219 Gastro-esophageal reflux disease without esophagitis: Secondary | ICD-10-CM | POA: Diagnosis not present

## 2021-06-18 DIAGNOSIS — E119 Type 2 diabetes mellitus without complications: Secondary | ICD-10-CM | POA: Diagnosis not present

## 2021-06-18 DIAGNOSIS — E559 Vitamin D deficiency, unspecified: Secondary | ICD-10-CM | POA: Diagnosis not present

## 2021-06-18 DIAGNOSIS — E785 Hyperlipidemia, unspecified: Secondary | ICD-10-CM | POA: Diagnosis not present

## 2021-06-20 DIAGNOSIS — J3081 Allergic rhinitis due to animal (cat) (dog) hair and dander: Secondary | ICD-10-CM | POA: Diagnosis not present

## 2021-06-20 DIAGNOSIS — I10 Essential (primary) hypertension: Secondary | ICD-10-CM | POA: Diagnosis not present

## 2021-06-20 DIAGNOSIS — J3089 Other allergic rhinitis: Secondary | ICD-10-CM | POA: Diagnosis not present

## 2021-06-20 DIAGNOSIS — H1045 Other chronic allergic conjunctivitis: Secondary | ICD-10-CM | POA: Diagnosis not present

## 2021-06-20 DIAGNOSIS — J301 Allergic rhinitis due to pollen: Secondary | ICD-10-CM | POA: Diagnosis not present

## 2021-06-20 DIAGNOSIS — E1165 Type 2 diabetes mellitus with hyperglycemia: Secondary | ICD-10-CM | POA: Diagnosis not present

## 2021-06-20 DIAGNOSIS — E559 Vitamin D deficiency, unspecified: Secondary | ICD-10-CM | POA: Diagnosis not present

## 2021-06-25 DIAGNOSIS — J3081 Allergic rhinitis due to animal (cat) (dog) hair and dander: Secondary | ICD-10-CM | POA: Diagnosis not present

## 2021-06-25 DIAGNOSIS — J3089 Other allergic rhinitis: Secondary | ICD-10-CM | POA: Diagnosis not present

## 2021-06-25 DIAGNOSIS — J301 Allergic rhinitis due to pollen: Secondary | ICD-10-CM | POA: Diagnosis not present

## 2021-06-28 ENCOUNTER — Other Ambulatory Visit: Payer: Self-pay | Admitting: Rheumatology

## 2021-07-01 NOTE — Telephone Encounter (Signed)
Next Visit: 07/24/2021   Last Visit: 01/23/2021   Last Fill:04/01/2021   Dx: Other insomnia   Current Dose per office note on 01/23/2021: trazodone 50 mg 1 tablet by mouth at bedtime   Okay to refill Trazodone?

## 2021-07-09 DIAGNOSIS — J3081 Allergic rhinitis due to animal (cat) (dog) hair and dander: Secondary | ICD-10-CM | POA: Diagnosis not present

## 2021-07-09 DIAGNOSIS — J301 Allergic rhinitis due to pollen: Secondary | ICD-10-CM | POA: Diagnosis not present

## 2021-07-09 DIAGNOSIS — J3089 Other allergic rhinitis: Secondary | ICD-10-CM | POA: Diagnosis not present

## 2021-07-10 NOTE — Progress Notes (Signed)
Office Visit Note  Patient: [name]             Date of Birth: [birth year]           MRN: [id number]             PCP: Lewis Moccasin, MD Referring: Lewis Moccasin, MD Visit Date: 07/24/2021 Occupation: @GUAROCC @  Subjective:  Low back pain   History of Present Illness: [name] is a 65 y.o. female with history of fibromyalgia, osteoarthritis, and DDD.  She continues to have chronic lower back pain from a herniated disc in the lumbar region causing left lower extremity radiculopathy. She is currently awaiting approval by her insurance to proceed with surgery.  She continues to take gabapentin 300 mg at bedtime and hydrocodone as needed for pain relief.  She has been riding a stationary bike as well as walking on a daily basis for exercise.  She is also been working on weight loss prior to surgery.  She has been experiencing increased pain on the lateral aspect of both hips consistent with trochanteric bursitis.  She is a side sleeper which exacerbates her discomfort.  She recently got a new mattress which has not provided much relief.  Patient reports that she continues to trapezius muscle tension and tenderness bilaterally especially on the left side.  She experiences muscle tightness and spasms intermittently.  She uses a heating pad as well as Tiger balm topically as needed for pain relief.  Overall her level of fatigue has been stable and she has been sleeping well at night with the current treatment regimen.  She takes trazodone 50 mg at bedtime and melatonin 10 mg at bedtime for insomnia. She experiences pain and stiffness in the right hand first thing in the morning secondary to a right ring trigger finger.  She has had several injections performed by Dr. 79 but is not ready to proceed with surgery at this time.  Activities of Daily Living:  Patient reports morning stiffness for 30-40 minutes.   Patient Denies nocturnal pain.  Difficulty dressing/grooming:  Denies Difficulty climbing stairs: Denies Difficulty getting out of chair: Reports Difficulty using hands for taps, buttons, cutlery, and/or writing: Reports  Review of Systems  Constitutional:  Positive for fatigue.  HENT:  Negative for mouth sores, mouth dryness and nose dryness.   Eyes:  Negative for pain, itching and dryness.  Respiratory:  Negative for shortness of breath and difficulty breathing.   Cardiovascular:  Negative for chest pain and palpitations.  Gastrointestinal:  Negative for blood in stool, constipation and diarrhea.  Endocrine: Negative for increased urination.  Genitourinary:  Negative for difficulty urinating.  Musculoskeletal:  Positive for joint pain, joint pain, joint swelling, myalgias, morning stiffness, muscle tenderness and myalgias.  Skin:  Negative for color change, rash and redness.  Allergic/Immunologic: Negative for susceptible to infections.  Neurological:  Positive for numbness. Negative for dizziness, headaches, memory loss and weakness.  Hematological:  Positive for bruising/bleeding tendency.  Psychiatric/Behavioral:  Negative for confusion.    PMFS History:  Patient Active Problem List   Diagnosis Date Noted   S/P total knee arthroplasty, right 09/18/2020   Benign hypertension 08/02/2018   Chronic back pain 08/02/2018   Diabetes mellitus (HCC) 08/02/2018   Hyperlipidemia 08/02/2018   Plantar fasciitis 07/11/2018   Acquired trigger finger 03/17/2018   Primary osteoarthritis of both knees 11/16/2017   DDD (degenerative disc disease), lumbar 11/16/2017   History of asthma 11/16/2017   History of  gastroesophageal reflux (GERD) 11/16/2017   History of hypertension 11/16/2017   History of neuropathy 11/16/2017   History of hypercholesterolemia 11/16/2017   Mass of hand 11/09/2017   Pain in right hand 11/09/2017   Trigger finger 11/09/2017   Fibromyalgia 07/26/2016   Greater trochanteric bursitis of both hips 07/26/2016   Chronic fatigue  07/26/2016   Other insomnia 07/26/2016   Rectal bleeding 01/31/2014    Past Medical History:  Diagnosis Date   Arthritis    RIGHT KNEE   Asthma    Chronic back pain    DDD (degenerative disc disease), lumbar    Diabetes mellitus type 2, diet-controlled (HCC)    pt denies    Fibromyalgia    GERD (gastroesophageal reflux disease)    Hemorrhoids    INTERNAL AND EXTERNAL   History of colon polyps    2009-- BENIGN   Hyperlipidemia    Hypertension    PONV (postoperative nausea and vomiting)    Right knee meniscal tear     Family History  Problem Relation Age of Onset   Heart disease Mother    Heart attack Mother    Arthritis Father    Diabetes Sister    Diabetes Brother    Hypertension Brother    Stomach cancer Brother    Past Surgical History:  Procedure Laterality Date   CARDIOVASCULAR STRESS TEST  07-06-2013   no ischemia or infarct/  normal LV function and wall motion , ef 63%   COLONOSCOPY W/ POLYPECTOMY  01-10-2008   KNEE ARTHROSCOPY Right 10-14-2006   KNEE ARTHROSCOPY WITH LATERAL MENISECTOMY Right 05/18/2015   Procedure: KNEE ARTHROSCOPY WITH PARTIAL  LATERAL MENISECTOMY;  Surgeon: Durene Romans, MD;  Location: [facility] [place];  Service: Orthopedics;  Laterality: Right;   KNEE ARTHROSCOPY WITH MEDIAL MENISECTOMY Right 05/18/2015   Procedure: KNEE ARTHROSCOPY WITH PARTIAL  MEDIAL MENISECTOMY;  Surgeon: Durene Romans, MD;  Location: [facility];  Service: Orthopedics;  Laterality: Right;   LIPOMA EXCISION Right 01/2018   right thumb lipoma    PULLEY RELEASE RIGHT LONG AND SMALL FINGERS  01-30-2006   RECONSTRUCTION OF ELBOW Bilateral right 06-03-2005  &  left  06-12-2011   ROBOTIC ASSITED PARTIAL NEPHRECTOMY Left 10-16-2008   oncocytoma ( negative neoplasm)   SHOULDER ARTHROSCOPY W/ SUBACROMIAL DECOMPRESSION AND DISTAL CLAVICLE EXCISION Left 07-07-2007   and ROTATOR CUFF REPAIR   SIGMOIDOSCOPY  02-02-2014   TOTAL ABDOMINAL HYSTERECTOMY W/  BILATERAL SALPINGOOPHORECTOMY  1979   TOTAL KNEE ARTHROPLASTY Right 09/18/2020   Procedure: TOTAL KNEE ARTHROPLASTY;  Surgeon: Durene Romans, MD;  Location: [facility];  Service: Orthopedics;  Laterality: Right;  70 mins   Social History   Social History Narrative   Not on file   Immunization History  Administered Date(s) Administered   Influenza,inj,Quad PF,6+ Mos 06/30/2018   Moderna Sars-Covid-2 Vaccination 11/05/2019, 12/04/2019, 08/22/2020, 02/06/2021     Objective: Vital Signs: BP (!) 152/77 (BP Location: Left Arm, Patient Position: Sitting, Cuff Size: Normal)   Pulse 67   Ht 5\' 6"  (1.676 m)   Wt 166 lb (75.3 kg)   BMI 26.79 kg/m    Physical Exam Vitals and nursing note reviewed.  Constitutional:      Appearance: She is well-developed.  HENT:     Head: Normocephalic and atraumatic.  Eyes:     Conjunctiva/sclera: Conjunctivae normal.  Pulmonary:     Effort: Pulmonary effort is normal.  Abdominal:     Palpations: Abdomen is soft.  Musculoskeletal:  Cervical back: Normal range of motion.  Skin:    General: Skin is warm and dry.     Capillary Refill: Capillary refill takes less than 2 seconds.  Neurological:     Mental Status: She is alert and oriented to person, place, and time.  Psychiatric:        Behavior: Behavior normal.     Musculoskeletal Exam: C-spine has good ROM.  Trapezius muscle tension and tenderness bilaterally.  Painful ROM of lumbar spine.  Midline spinal tenderness in lumbar region.  Shoulder joints, elbow joints, wrist joints, MCPs, PIPs, and DIPs good ROM with no synovitis.  Complete fist formation bilaterally.  Right ring trigger finger.  Hip joints have good ROM with no discomfort.  Tenderness over bilateral trochanteric bursa.  Right knee replacement has good ROM with warmth.  Left knee joint has good ROM with no warmth or effusion.  Ankle joints have good ROM with no tenderness or joint swelling.    CDAI Exam: CDAI Score: -- Patient Global:  --; Provider Global: -- Swollen: --; Tender: -- Joint Exam 07/24/2021   No joint exam has been documented for this visit   There is currently no information documented on the homunculus. Go to the Rheumatology activity and complete the homunculus joint exam.  Investigation: No additional findings.  Imaging: No results found.  Recent Labs: Lab Results  Component Value Date   WBC 12.2 (H) 09/19/2020   HGB 10.7 (L) 09/19/2020   PLT 175 09/19/2020   NA 137 09/19/2020   K 3.9 09/19/2020   CL 104 09/19/2020   CO2 25 09/19/2020   GLUCOSE 130 (H) 09/19/2020   BUN 19 09/19/2020   CREATININE 0.97 09/19/2020   BILITOT 0.3 06/28/2013   ALKPHOS 68 06/28/2013   AST 14 06/28/2013   ALT 11 06/28/2013   PROT 7.6 06/28/2013   ALBUMIN 3.8 06/28/2013   CALCIUM 8.9 09/19/2020   GFRAA >60 05/08/2015    Speciality Comments: No specialty comments available.  Procedures:  No procedures performed Allergies: Dilaudid [hydromorphone], Levofloxacin, Macrobid [nitrofurantoin macrocrystal], Oxycodone-acetaminophen, Penicillin g benzathine, Seasonal ic [cholestatin], Cyclobenzaprine, Penicillins, Percocet [oxycodone-acetaminophen], and Zanaflex [tizanidine hcl]   Assessment / Plan:     Visit Diagnoses: Fibromyalgia: She has intermittent myalgias and muscle tenderness due to fibromyalgia flares.  Overall her symptoms have been tolerable.  She presents today with trapezius muscle tension and tenderness bilaterally, left greater than right.  She experiences muscle spasms intermittently.  Discussed maintenance myofascial releases, heat therapy, and the use of topical agents.  She is also been having increased discomfort due to trochanter bursitis of both hips.  She was given a handout of exercises to perform.  Several exercises were demonstrated today in the office.  She declined a referral to physical therapy at this time.  If her symptoms persist or worsen in the future she can return for a cortisone  injection to alleviate her symptoms.  Discussed the importance of regular exercise and good sleep hygiene.  She plans on continuing to take trazodone 50 mg 1 tablet at bedtime and melatonin 10 mg at bedtime for insomnia.  Discussed the importance of trying to get restorative sleep.  She will follow-up in the office in 6 months.  Chronic fatigue: Chronic and secondary to insomnia.  Discussed the importance of daily exercise.  She has been riding a stationary bike and walking daily for exercise.   Other insomnia -She takes trazodone 50 mg 1 tablet by mouth at bedtime and melatonin 10 mg by  mouth at bedtime for insomnia. Discussed the importance of good sleep hygiene.   Trapezius muscle spasm: She has trapezius muscle tension and tenderness bilaterally especially the left side.  Different treatment options were discussed today in detail.  She would benefit from massage and myofascial release.  We also discussed heat therapy and the use of topical agents including Salonpas patches.  Greater trochanteric bursitis of both hips: She has tenderness over bilateral trochanteric bursa.  Her discomfort is exacerbated by lying on her sides at night.  Discussed a referral to physical therapy but she would like to hold off at this time.  She was given a handout of home exercises to perform.  In the future if her symptoms do not improve she can return for a cortisone injection to alleviate her symptoms.  Status post right knee replacement - Performed on 09/18/20 by Dr. Charlann Boxer.  Doing well.  She has good range of motion with warmth but no effusion on examination today.  Primary osteoarthritis of left knee: She has good range of motion of the left knee joint with no warmth or effusion.  No mechanical symptoms at this time.  Trigger finger, right ring finger - She continues to experience intermittent tenderness and locking especially first thing in the morning.  She had 3 injections by Dr. Amanda Pea in the past.  She is not  ready to proceed with surgery at this time.  DDD (degenerative disc disease), lumbar - Status post fusion.  She is followed by Dr. Noel Gerold.  According to the patient she currently has a herniated disc in the lumbar spine.  She is currently experiencing left lower extremity radiculopathy and weakness.  She is awaiting approval by her insurance to proceed with surgery.  She has been taking gabapentin and hydrocodone as prescribed for pain relief.  She has been trying to remain active riding her stationary bike and walking on a daily basis for exercise.  She continues to work on weight loss prior to surgery as well.  Other medical conditions are listed as follows:   History of hypercholesterolemia  History of hypertension  History of asthma  History of gastroesophageal reflux (GERD)  Orders: No orders of the defined types were placed in this encounter.  No orders of the defined types were placed in this encounter.     Follow-Up Instructions: Return in about 6 months (around 01/21/2022) for Fibromyalgia, Osteoarthritis, DDD.   Gearldine Bienenstock, PA-C  Note - This record has been created using Dragon software.  Chart creation errors have been sought, but may not always  have been located. Such creation errors do not reflect on  the standard of medical care.

## 2021-07-22 DIAGNOSIS — J301 Allergic rhinitis due to pollen: Secondary | ICD-10-CM | POA: Diagnosis not present

## 2021-07-22 DIAGNOSIS — J3089 Other allergic rhinitis: Secondary | ICD-10-CM | POA: Diagnosis not present

## 2021-07-22 DIAGNOSIS — J3081 Allergic rhinitis due to animal (cat) (dog) hair and dander: Secondary | ICD-10-CM | POA: Diagnosis not present

## 2021-07-24 ENCOUNTER — Encounter: Payer: Self-pay | Admitting: Physician Assistant

## 2021-07-24 ENCOUNTER — Other Ambulatory Visit: Payer: Self-pay

## 2021-07-24 ENCOUNTER — Ambulatory Visit: Payer: Medicare Other | Admitting: Physician Assistant

## 2021-07-24 VITALS — BP 152/77 | HR 67 | Ht 66.0 in | Wt 166.0 lb

## 2021-07-24 DIAGNOSIS — M1712 Unilateral primary osteoarthritis, left knee: Secondary | ICD-10-CM

## 2021-07-24 DIAGNOSIS — G4709 Other insomnia: Secondary | ICD-10-CM

## 2021-07-24 DIAGNOSIS — R5382 Chronic fatigue, unspecified: Secondary | ICD-10-CM | POA: Diagnosis not present

## 2021-07-24 DIAGNOSIS — M797 Fibromyalgia: Secondary | ICD-10-CM | POA: Diagnosis not present

## 2021-07-24 DIAGNOSIS — Z8679 Personal history of other diseases of the circulatory system: Secondary | ICD-10-CM

## 2021-07-24 DIAGNOSIS — M62838 Other muscle spasm: Secondary | ICD-10-CM | POA: Diagnosis not present

## 2021-07-24 DIAGNOSIS — Z8719 Personal history of other diseases of the digestive system: Secondary | ICD-10-CM

## 2021-07-24 DIAGNOSIS — M65341 Trigger finger, right ring finger: Secondary | ICD-10-CM

## 2021-07-24 DIAGNOSIS — M7062 Trochanteric bursitis, left hip: Secondary | ICD-10-CM

## 2021-07-24 DIAGNOSIS — Z96651 Presence of right artificial knee joint: Secondary | ICD-10-CM

## 2021-07-24 DIAGNOSIS — Z8639 Personal history of other endocrine, nutritional and metabolic disease: Secondary | ICD-10-CM

## 2021-07-24 DIAGNOSIS — Z8709 Personal history of other diseases of the respiratory system: Secondary | ICD-10-CM

## 2021-07-24 DIAGNOSIS — M7061 Trochanteric bursitis, right hip: Secondary | ICD-10-CM

## 2021-07-24 DIAGNOSIS — M5136 Other intervertebral disc degeneration, lumbar region: Secondary | ICD-10-CM

## 2021-07-24 NOTE — Patient Instructions (Signed)
Hip Bursitis Rehab Ask your health care provider which exercises are safe for you. Do exercises exactly as told by your health care provider and adjust them as directed. It is normal to feel mild stretching, pulling, tightness, or discomfort as you do these exercises. Stop right away if you feel sudden pain or your pain gets worse. Do not begin these exercises until told by your health care provider. Stretching exercise This exercise warms up your muscles and joints and improves the movement and flexibility of your hip. This exercise also helps to relieve pain and stiffness. Iliotibial band stretch An iliotibial band is a strong band of muscle tissue that runs from the outer side of your hip to the outer side of your thigh and knee. Lie on your side with your left / right leg in the top position. Bend your left / right knee and grab your ankle. Stretch out your bottom arm to help you balance. Slowly bring your knee back so your thigh is behind your body. Slowly lower your knee toward the floor until you feel a gentle stretch on the outside of your left / right thigh. If you do not feel a stretch and your knee will not fall farther, place the heel of your other foot on top of your knee and pull your knee down toward the floor with your foot. Hold this position for __________ seconds. Slowly return to the starting position. Repeat __________ times. Complete this exercise __________ times a day. Strengthening exercises These exercises build strength and endurance in your hip and pelvis. Endurance is the ability to use your muscles for a long time, even after they get tired. Bridge This exercise strengthens the muscles that move your thigh backward (hip extensors). Lie on your back on a firm surface with your knees bent and your feet flat on the floor. Tighten your buttocks muscles and lift your buttocks off the floor until your trunk is level with your thighs. Do not arch your back. You should feel  the muscles working in your buttocks and the back of your thighs. If you do not feel these muscles, slide your feet 1-2 inches (2.5-5 cm) farther away from your buttocks. If this exercise is too easy, try doing it with your arms crossed over your chest. Hold this position for __________ seconds. Slowly lower your hips to the starting position. Let your muscles relax completely after each repetition. Repeat __________ times. Complete this exercise __________ times a day. Squats This exercise strengthens the muscles in front of your thigh and knee (quadriceps). Stand in front of a table, with your feet and knees pointing straight ahead. You may rest your hands on the table for balance but not for support. Slowly bend your knees and lower your hips like you are going to sit in a chair. Keep your weight over your heels, not over your toes. Keep your lower legs upright so they are parallel with the table legs. Do not let your hips go lower than your knees. Do not bend lower than told by your health care provider. If your hip pain increases, do not bend as low. Hold the squat position for __________ seconds. Slowly push with your legs to return to standing. Do not use your hands to pull yourself to standing. Repeat __________ times. Complete this exercise __________ times a day. Hip hike Stand sideways on a bottom step. Stand on your left / right leg with your other foot unsupported next to the step. You can hold   on to the railing or wall for balance if needed. Keep your knees straight and your torso square. Then lift your left / right hip up toward the ceiling. Hold this position for __________ seconds. Slowly let your left / right hip lower toward the floor, past the starting position. Your foot should get closer to the floor. Do not lean or bend your knees. Repeat __________ times. Complete this exercise __________ times a day. Single leg stand Without shoes, stand near a railing or in a  doorway. You may hold on to the railing or door frame as needed for balance. Squeeze your left / right buttock muscles, then lift up your other foot. Do not let your left / right hip push out to the side. It is helpful to stand in front of a mirror for this exercise so you can watch your hip. Hold this position for __________ seconds. Repeat __________ times. Complete this exercise __________ times a day. This information is not intended to replace advice given to you by your health care provider. Make sure you discuss any questions you have with your health care provider. Document Revised: 01/03/2019 Document Reviewed: 01/03/2019 Elsevier Patient Education  2022 [employer]   Iliotibial Band Syndrome Rehab Ask your health care provider which exercises are safe for you. Do exercises exactly as told by your health care provider and adjust them as directed. It is normal to feel mild stretching, pulling, tightness, or discomfort as you do these exercises. Stop right away if you feel sudden pain or your pain gets significantly worse. Do not begin these exercises until told by your health care provider. Stretching and range-of-motion exercises These exercises warm up your muscles and joints and improve the movement and flexibility of your hip and pelvis. Quadriceps stretch, prone  Lie on your abdomen (prone position) on a firm surface, such as a bed or padded floor. Bend your left / right knee and reach back to hold your ankle or pant leg. If you cannot reach your ankle or pant leg, loop a belt around your foot and grab the belt instead. Gently pull your heel toward your buttocks. Your knee should not slide out to the side. You should feel a stretch in the front of your thigh and knee (quadriceps). Hold this position for __________ seconds. Repeat __________ times. Complete this exercise __________ times a day. Iliotibial band stretch An iliotibial band is a strong band of muscle tissue that  runs from the outer side of your hip to the outer side of your thigh and knee. Lie on your side with your left / right leg in the top position. Bend both of your knees and grab your left / right ankle. Stretch out your bottom arm to help you balance. Slowly bring your top knee back so your thigh goes behind your trunk. Slowly lower your top leg toward the floor until you feel a gentle stretch on the outside of your left / right hip and thigh. If you do not feel a stretch and your knee will not fall farther, place the heel of your other foot on top of your knee and pull your knee down toward the floor with your foot. Hold this position for __________ seconds. Repeat __________ times. Complete this exercise __________ times a day. Strengthening exercises These exercises build strength and endurance in your hip and pelvis. Endurance is the ability to use your muscles for a long time, even after they get tired. Straight leg raises, side-lying This exercise   strengthens the muscles that rotate the leg at the hip and move it away from your body (hip abductors). Lie on your side with your left / right leg in the top position. Lie so your head, shoulder, hip, and knee line up. You may bend your bottom knee to help you balance. Roll your hips slightly forward so your hips are stacked directly over each other and your left / right knee is facing forward. Tense the muscles in your outer thigh and lift your top leg 4-6 inches (10-15 cm). Hold this position for __________ seconds. Slowly lower your leg to return to the starting position. Let your muscles relax completely before doing another repetition. Repeat __________ times. Complete this exercise __________ times a day. Leg raises, prone This exercise strengthens the muscles that move the hips backward (hip extensors). Lie on your abdomen (prone position) on your bed or a firm surface. You can put a pillow under your hips if that is more comfortable for  your lower back. Bend your left / right knee so your foot is straight up in the air. Squeeze your buttocks muscles and lift your left / right thigh off the bed. Do not let your back arch. Tense your thigh muscle as hard as you can without increasing any knee pain. Hold this position for __________ seconds. Slowly lower your leg to return to the starting position and allow it to relax completely. Repeat __________ times. Complete this exercise __________ times a day. Hip hike Stand sideways on a bottom step. Stand on your left / right leg with your other foot unsupported next to the step. You can hold on to a railing or wall for balance if needed. Keep your knees straight and your torso square. Then lift your left / right hip up toward the ceiling. Slowly let your left / right hip lower toward the floor, past the starting position. Your foot should get closer to the floor. Do not lean or bend your knees. Repeat __________ times. Complete this exercise __________ times a day. This information is not intended to replace advice given to you by your health care provider. Make sure you discuss any questions you have with your health care provider. Document Revised: 11/16/2019 Document Reviewed: 11/16/2019 Elsevier Patient Education  2022 [employer] Inc[employer]  

## 2021-07-25 DIAGNOSIS — Z6830 Body mass index (BMI) 30.0-30.9, adult: Secondary | ICD-10-CM | POA: Diagnosis not present

## 2021-07-25 DIAGNOSIS — M4716 Other spondylosis with myelopathy, lumbar region: Secondary | ICD-10-CM | POA: Diagnosis not present

## 2021-07-25 DIAGNOSIS — G894 Chronic pain syndrome: Secondary | ICD-10-CM | POA: Diagnosis not present

## 2021-08-05 DIAGNOSIS — J3081 Allergic rhinitis due to animal (cat) (dog) hair and dander: Secondary | ICD-10-CM | POA: Diagnosis not present

## 2021-08-05 DIAGNOSIS — J3089 Other allergic rhinitis: Secondary | ICD-10-CM | POA: Diagnosis not present

## 2021-08-05 DIAGNOSIS — J301 Allergic rhinitis due to pollen: Secondary | ICD-10-CM | POA: Diagnosis not present

## 2021-08-20 DIAGNOSIS — J301 Allergic rhinitis due to pollen: Secondary | ICD-10-CM | POA: Diagnosis not present

## 2021-08-20 DIAGNOSIS — J3081 Allergic rhinitis due to animal (cat) (dog) hair and dander: Secondary | ICD-10-CM | POA: Diagnosis not present

## 2021-08-20 DIAGNOSIS — J3089 Other allergic rhinitis: Secondary | ICD-10-CM | POA: Diagnosis not present

## 2021-08-22 DIAGNOSIS — J301 Allergic rhinitis due to pollen: Secondary | ICD-10-CM | POA: Diagnosis not present

## 2021-08-22 DIAGNOSIS — J3089 Other allergic rhinitis: Secondary | ICD-10-CM | POA: Diagnosis not present

## 2021-08-22 DIAGNOSIS — J3081 Allergic rhinitis due to animal (cat) (dog) hair and dander: Secondary | ICD-10-CM | POA: Diagnosis not present

## 2021-08-23 DIAGNOSIS — M4716 Other spondylosis with myelopathy, lumbar region: Secondary | ICD-10-CM | POA: Diagnosis not present

## 2021-08-23 DIAGNOSIS — M545 Low back pain, unspecified: Secondary | ICD-10-CM | POA: Diagnosis not present

## 2021-08-23 DIAGNOSIS — M5106 Intervertebral disc disorders with myelopathy, lumbar region: Secondary | ICD-10-CM | POA: Diagnosis not present

## 2021-08-23 DIAGNOSIS — M48062 Spinal stenosis, lumbar region with neurogenic claudication: Secondary | ICD-10-CM | POA: Diagnosis not present

## 2021-08-30 DIAGNOSIS — J3089 Other allergic rhinitis: Secondary | ICD-10-CM | POA: Diagnosis not present

## 2021-08-30 DIAGNOSIS — J301 Allergic rhinitis due to pollen: Secondary | ICD-10-CM | POA: Diagnosis not present

## 2021-08-30 DIAGNOSIS — J3081 Allergic rhinitis due to animal (cat) (dog) hair and dander: Secondary | ICD-10-CM | POA: Diagnosis not present

## 2021-09-02 DIAGNOSIS — I517 Cardiomegaly: Secondary | ICD-10-CM | POA: Diagnosis not present

## 2021-09-02 DIAGNOSIS — Z9889 Other specified postprocedural states: Secondary | ICD-10-CM | POA: Diagnosis not present

## 2021-09-02 DIAGNOSIS — Z7951 Long term (current) use of inhaled steroids: Secondary | ICD-10-CM | POA: Diagnosis not present

## 2021-09-02 DIAGNOSIS — M532X6 Spinal instabilities, lumbar region: Secondary | ICD-10-CM | POA: Diagnosis not present

## 2021-09-02 DIAGNOSIS — M48062 Spinal stenosis, lumbar region with neurogenic claudication: Secondary | ICD-10-CM | POA: Diagnosis not present

## 2021-09-02 DIAGNOSIS — Z981 Arthrodesis status: Secondary | ICD-10-CM | POA: Diagnosis not present

## 2021-09-02 DIAGNOSIS — Z96651 Presence of right artificial knee joint: Secondary | ICD-10-CM | POA: Diagnosis not present

## 2021-09-02 DIAGNOSIS — I10 Essential (primary) hypertension: Secondary | ICD-10-CM | POA: Diagnosis not present

## 2021-09-02 DIAGNOSIS — Z01812 Encounter for preprocedural laboratory examination: Secondary | ICD-10-CM | POA: Diagnosis not present

## 2021-09-02 DIAGNOSIS — M4326 Fusion of spine, lumbar region: Secondary | ICD-10-CM | POA: Diagnosis not present

## 2021-09-02 DIAGNOSIS — M5116 Intervertebral disc disorders with radiculopathy, lumbar region: Secondary | ICD-10-CM | POA: Diagnosis not present

## 2021-09-02 DIAGNOSIS — M47817 Spondylosis without myelopathy or radiculopathy, lumbosacral region: Secondary | ICD-10-CM | POA: Diagnosis not present

## 2021-09-02 DIAGNOSIS — M4716 Other spondylosis with myelopathy, lumbar region: Secondary | ICD-10-CM | POA: Diagnosis not present

## 2021-09-02 DIAGNOSIS — Q762 Congenital spondylolisthesis: Secondary | ICD-10-CM | POA: Diagnosis not present

## 2021-09-02 DIAGNOSIS — M5106 Intervertebral disc disorders with myelopathy, lumbar region: Secondary | ICD-10-CM | POA: Diagnosis not present

## 2021-09-02 DIAGNOSIS — Z87891 Personal history of nicotine dependence: Secondary | ICD-10-CM | POA: Diagnosis not present

## 2021-09-02 DIAGNOSIS — M5416 Radiculopathy, lumbar region: Secondary | ICD-10-CM | POA: Diagnosis not present

## 2021-09-02 DIAGNOSIS — G894 Chronic pain syndrome: Secondary | ICD-10-CM | POA: Diagnosis not present

## 2021-09-03 DIAGNOSIS — Z981 Arthrodesis status: Secondary | ICD-10-CM | POA: Diagnosis not present

## 2021-09-03 DIAGNOSIS — M4326 Fusion of spine, lumbar region: Secondary | ICD-10-CM | POA: Diagnosis not present

## 2021-09-20 DIAGNOSIS — J3089 Other allergic rhinitis: Secondary | ICD-10-CM | POA: Diagnosis not present

## 2021-09-20 DIAGNOSIS — J301 Allergic rhinitis due to pollen: Secondary | ICD-10-CM | POA: Diagnosis not present

## 2021-09-20 DIAGNOSIS — J3081 Allergic rhinitis due to animal (cat) (dog) hair and dander: Secondary | ICD-10-CM | POA: Diagnosis not present

## 2021-09-24 ENCOUNTER — Other Ambulatory Visit: Payer: Self-pay | Admitting: Rheumatology

## 2021-09-25 NOTE — Telephone Encounter (Signed)
Please schedule patient for a follow up visit. Patient due May 2023. Thanks!  

## 2021-09-25 NOTE — Telephone Encounter (Signed)
Next Visit: Due May 2023. Message sent to front to schedule patient.   Last Visit: 07/24/2021  Last Fill: 07/01/2021  Dx: Other insomnia   Current Dose per office note on 07/24/2021:  trazodone 50 mg 1 tablet by mouth at bedtime   Okay to refill Trazodone?

## 2021-10-01 DIAGNOSIS — J3089 Other allergic rhinitis: Secondary | ICD-10-CM | POA: Diagnosis not present

## 2021-10-01 DIAGNOSIS — J3081 Allergic rhinitis due to animal (cat) (dog) hair and dander: Secondary | ICD-10-CM | POA: Diagnosis not present

## 2021-10-01 DIAGNOSIS — J301 Allergic rhinitis due to pollen: Secondary | ICD-10-CM | POA: Diagnosis not present

## 2021-10-02 DIAGNOSIS — I10 Essential (primary) hypertension: Secondary | ICD-10-CM | POA: Diagnosis not present

## 2021-10-02 DIAGNOSIS — E785 Hyperlipidemia, unspecified: Secondary | ICD-10-CM | POA: Diagnosis not present

## 2021-10-02 DIAGNOSIS — E119 Type 2 diabetes mellitus without complications: Secondary | ICD-10-CM | POA: Diagnosis not present

## 2021-10-07 DIAGNOSIS — M545 Low back pain, unspecified: Secondary | ICD-10-CM | POA: Diagnosis not present

## 2021-10-15 DIAGNOSIS — J301 Allergic rhinitis due to pollen: Secondary | ICD-10-CM | POA: Diagnosis not present

## 2021-10-15 DIAGNOSIS — J3081 Allergic rhinitis due to animal (cat) (dog) hair and dander: Secondary | ICD-10-CM | POA: Diagnosis not present

## 2021-10-15 DIAGNOSIS — J3089 Other allergic rhinitis: Secondary | ICD-10-CM | POA: Diagnosis not present

## 2021-10-16 DIAGNOSIS — J3089 Other allergic rhinitis: Secondary | ICD-10-CM | POA: Diagnosis not present

## 2021-10-16 DIAGNOSIS — J301 Allergic rhinitis due to pollen: Secondary | ICD-10-CM | POA: Diagnosis not present

## 2021-10-16 DIAGNOSIS — R7303 Prediabetes: Secondary | ICD-10-CM | POA: Diagnosis not present

## 2021-10-16 DIAGNOSIS — J3081 Allergic rhinitis due to animal (cat) (dog) hair and dander: Secondary | ICD-10-CM | POA: Diagnosis not present

## 2021-10-16 DIAGNOSIS — E782 Mixed hyperlipidemia: Secondary | ICD-10-CM | POA: Diagnosis not present

## 2021-10-21 DIAGNOSIS — G9332 Myalgic encephalomyelitis/chronic fatigue syndrome: Secondary | ICD-10-CM | POA: Diagnosis not present

## 2021-10-29 DIAGNOSIS — J3089 Other allergic rhinitis: Secondary | ICD-10-CM | POA: Diagnosis not present

## 2021-10-29 DIAGNOSIS — J3081 Allergic rhinitis due to animal (cat) (dog) hair and dander: Secondary | ICD-10-CM | POA: Diagnosis not present

## 2021-10-29 DIAGNOSIS — J301 Allergic rhinitis due to pollen: Secondary | ICD-10-CM | POA: Diagnosis not present

## 2021-11-05 DIAGNOSIS — J3089 Other allergic rhinitis: Secondary | ICD-10-CM | POA: Diagnosis not present

## 2021-11-05 DIAGNOSIS — J3081 Allergic rhinitis due to animal (cat) (dog) hair and dander: Secondary | ICD-10-CM | POA: Diagnosis not present

## 2021-11-05 DIAGNOSIS — J301 Allergic rhinitis due to pollen: Secondary | ICD-10-CM | POA: Diagnosis not present

## 2021-11-08 DIAGNOSIS — J3089 Other allergic rhinitis: Secondary | ICD-10-CM | POA: Diagnosis not present

## 2021-11-08 DIAGNOSIS — J301 Allergic rhinitis due to pollen: Secondary | ICD-10-CM | POA: Diagnosis not present

## 2021-11-08 DIAGNOSIS — J3081 Allergic rhinitis due to animal (cat) (dog) hair and dander: Secondary | ICD-10-CM | POA: Diagnosis not present

## 2021-11-12 DIAGNOSIS — J301 Allergic rhinitis due to pollen: Secondary | ICD-10-CM | POA: Diagnosis not present

## 2021-11-12 DIAGNOSIS — J3089 Other allergic rhinitis: Secondary | ICD-10-CM | POA: Diagnosis not present

## 2021-11-12 DIAGNOSIS — J3081 Allergic rhinitis due to animal (cat) (dog) hair and dander: Secondary | ICD-10-CM | POA: Diagnosis not present

## 2021-11-15 DIAGNOSIS — J3089 Other allergic rhinitis: Secondary | ICD-10-CM | POA: Diagnosis not present

## 2021-11-15 DIAGNOSIS — J3081 Allergic rhinitis due to animal (cat) (dog) hair and dander: Secondary | ICD-10-CM | POA: Diagnosis not present

## 2021-11-15 DIAGNOSIS — J301 Allergic rhinitis due to pollen: Secondary | ICD-10-CM | POA: Diagnosis not present

## 2021-11-21 DIAGNOSIS — J3089 Other allergic rhinitis: Secondary | ICD-10-CM | POA: Diagnosis not present

## 2021-11-21 DIAGNOSIS — J3081 Allergic rhinitis due to animal (cat) (dog) hair and dander: Secondary | ICD-10-CM | POA: Diagnosis not present

## 2021-11-21 DIAGNOSIS — J301 Allergic rhinitis due to pollen: Secondary | ICD-10-CM | POA: Diagnosis not present

## 2021-11-26 DIAGNOSIS — J3081 Allergic rhinitis due to animal (cat) (dog) hair and dander: Secondary | ICD-10-CM | POA: Diagnosis not present

## 2021-11-26 DIAGNOSIS — J3089 Other allergic rhinitis: Secondary | ICD-10-CM | POA: Diagnosis not present

## 2021-11-26 DIAGNOSIS — J301 Allergic rhinitis due to pollen: Secondary | ICD-10-CM | POA: Diagnosis not present

## 2021-12-02 DIAGNOSIS — J3089 Other allergic rhinitis: Secondary | ICD-10-CM | POA: Diagnosis not present

## 2021-12-02 DIAGNOSIS — J301 Allergic rhinitis due to pollen: Secondary | ICD-10-CM | POA: Diagnosis not present

## 2021-12-02 DIAGNOSIS — J3081 Allergic rhinitis due to animal (cat) (dog) hair and dander: Secondary | ICD-10-CM | POA: Diagnosis not present

## 2021-12-04 DIAGNOSIS — M4326 Fusion of spine, lumbar region: Secondary | ICD-10-CM | POA: Diagnosis not present

## 2021-12-04 DIAGNOSIS — M7061 Trochanteric bursitis, right hip: Secondary | ICD-10-CM | POA: Diagnosis not present

## 2021-12-04 DIAGNOSIS — M25551 Pain in right hip: Secondary | ICD-10-CM | POA: Diagnosis not present

## 2021-12-09 DIAGNOSIS — H524 Presbyopia: Secondary | ICD-10-CM | POA: Diagnosis not present

## 2021-12-09 DIAGNOSIS — J3081 Allergic rhinitis due to animal (cat) (dog) hair and dander: Secondary | ICD-10-CM | POA: Diagnosis not present

## 2021-12-09 DIAGNOSIS — J3089 Other allergic rhinitis: Secondary | ICD-10-CM | POA: Diagnosis not present

## 2021-12-09 DIAGNOSIS — J301 Allergic rhinitis due to pollen: Secondary | ICD-10-CM | POA: Diagnosis not present

## 2021-12-17 DIAGNOSIS — J3081 Allergic rhinitis due to animal (cat) (dog) hair and dander: Secondary | ICD-10-CM | POA: Diagnosis not present

## 2021-12-17 DIAGNOSIS — J301 Allergic rhinitis due to pollen: Secondary | ICD-10-CM | POA: Diagnosis not present

## 2021-12-17 DIAGNOSIS — J3089 Other allergic rhinitis: Secondary | ICD-10-CM | POA: Diagnosis not present

## 2021-12-23 DIAGNOSIS — M2041 Other hammer toe(s) (acquired), right foot: Secondary | ICD-10-CM | POA: Diagnosis not present

## 2021-12-23 DIAGNOSIS — M7741 Metatarsalgia, right foot: Secondary | ICD-10-CM | POA: Diagnosis not present

## 2021-12-24 DIAGNOSIS — J3089 Other allergic rhinitis: Secondary | ICD-10-CM | POA: Diagnosis not present

## 2021-12-24 DIAGNOSIS — J3081 Allergic rhinitis due to animal (cat) (dog) hair and dander: Secondary | ICD-10-CM | POA: Diagnosis not present

## 2021-12-24 DIAGNOSIS — Z981 Arthrodesis status: Secondary | ICD-10-CM | POA: Diagnosis not present

## 2021-12-24 DIAGNOSIS — J301 Allergic rhinitis due to pollen: Secondary | ICD-10-CM | POA: Diagnosis not present

## 2021-12-31 DIAGNOSIS — J3081 Allergic rhinitis due to animal (cat) (dog) hair and dander: Secondary | ICD-10-CM | POA: Diagnosis not present

## 2021-12-31 DIAGNOSIS — J3089 Other allergic rhinitis: Secondary | ICD-10-CM | POA: Diagnosis not present

## 2021-12-31 DIAGNOSIS — J301 Allergic rhinitis due to pollen: Secondary | ICD-10-CM | POA: Diagnosis not present

## 2022-01-02 DIAGNOSIS — Z1231 Encounter for screening mammogram for malignant neoplasm of breast: Secondary | ICD-10-CM | POA: Diagnosis not present

## 2022-01-02 DIAGNOSIS — Z1272 Encounter for screening for malignant neoplasm of vagina: Secondary | ICD-10-CM | POA: Diagnosis not present

## 2022-01-02 DIAGNOSIS — Z1151 Encounter for screening for human papillomavirus (HPV): Secondary | ICD-10-CM | POA: Diagnosis not present

## 2022-01-02 DIAGNOSIS — Z6825 Body mass index (BMI) 25.0-25.9, adult: Secondary | ICD-10-CM | POA: Diagnosis not present

## 2022-01-02 DIAGNOSIS — Z01419 Encounter for gynecological examination (general) (routine) without abnormal findings: Secondary | ICD-10-CM | POA: Diagnosis not present

## 2022-01-06 DIAGNOSIS — Z0000 Encounter for general adult medical examination without abnormal findings: Secondary | ICD-10-CM | POA: Diagnosis not present

## 2022-01-06 DIAGNOSIS — Z1331 Encounter for screening for depression: Secondary | ICD-10-CM | POA: Diagnosis not present

## 2022-01-06 DIAGNOSIS — Z1339 Encounter for screening examination for other mental health and behavioral disorders: Secondary | ICD-10-CM | POA: Diagnosis not present

## 2022-01-07 DIAGNOSIS — Z79899 Other long term (current) drug therapy: Secondary | ICD-10-CM | POA: Diagnosis not present

## 2022-01-07 DIAGNOSIS — M4326 Fusion of spine, lumbar region: Secondary | ICD-10-CM | POA: Diagnosis not present

## 2022-01-07 DIAGNOSIS — M79651 Pain in right thigh: Secondary | ICD-10-CM | POA: Diagnosis not present

## 2022-01-08 DIAGNOSIS — Z23 Encounter for immunization: Secondary | ICD-10-CM | POA: Diagnosis not present

## 2022-01-09 DIAGNOSIS — H1045 Other chronic allergic conjunctivitis: Secondary | ICD-10-CM | POA: Diagnosis not present

## 2022-01-09 DIAGNOSIS — J3081 Allergic rhinitis due to animal (cat) (dog) hair and dander: Secondary | ICD-10-CM | POA: Diagnosis not present

## 2022-01-09 DIAGNOSIS — J301 Allergic rhinitis due to pollen: Secondary | ICD-10-CM | POA: Diagnosis not present

## 2022-01-09 DIAGNOSIS — J3089 Other allergic rhinitis: Secondary | ICD-10-CM | POA: Diagnosis not present

## 2022-01-09 DIAGNOSIS — J4530 Mild persistent asthma, uncomplicated: Secondary | ICD-10-CM | POA: Diagnosis not present

## 2022-01-15 DIAGNOSIS — J3081 Allergic rhinitis due to animal (cat) (dog) hair and dander: Secondary | ICD-10-CM | POA: Diagnosis not present

## 2022-01-15 DIAGNOSIS — J301 Allergic rhinitis due to pollen: Secondary | ICD-10-CM | POA: Diagnosis not present

## 2022-01-15 DIAGNOSIS — J3089 Other allergic rhinitis: Secondary | ICD-10-CM | POA: Diagnosis not present

## 2022-01-28 DIAGNOSIS — J301 Allergic rhinitis due to pollen: Secondary | ICD-10-CM | POA: Diagnosis not present

## 2022-01-28 DIAGNOSIS — J3089 Other allergic rhinitis: Secondary | ICD-10-CM | POA: Diagnosis not present

## 2022-01-28 DIAGNOSIS — J3081 Allergic rhinitis due to animal (cat) (dog) hair and dander: Secondary | ICD-10-CM | POA: Diagnosis not present

## 2022-01-28 NOTE — Progress Notes (Signed)
Office Visit Note  Patient: [name]             Date of Birth: 12/31/1955           MRN: [id number]             PCP: Fanny Bien, MD Referring: Fanny Bien, MD Visit Date: 02/11/2022 Occupation: @GUAROCC @  Subjective:  Arthritis (Pain)   History of Present Illness: [name] is a 66 y.o. female with fibromyalgia, osteoarthritis and degenerative disc disease.  She states she had recent cortisone injection in her right trochanteric bursa, oral prednisone taper and intramuscular Toradol for ongoing lower back pain.  She had some relief from this combination.  She states she was having increase fluid retention for the last few days which has eased off today.  She continues to have  discomfort in the trapezius region.  She states her right ring trigger finger improved after the prednisone taper but the symptoms are coming back now.  She continues to have pain and discomfort in her right knee joint which is replaced.  She denies any discomfort in the left knee joint.  She continues to have some generalized pain and discomfort from fibromyalgia.  Her insomnia is controlled on trazodone 50 mg p.o. nightly along with melatonin.   Activities of Daily Living:  Patient reports morning stiffness for 30 minutes.   Patient Denies nocturnal pain.  Difficulty dressing/grooming: Reports Difficulty climbing stairs: Denies Difficulty getting out of chair: Reports Difficulty using hands for taps, buttons, cutlery, and/or writing: Reports  Review of Systems  Constitutional:  Positive for fatigue.  HENT:  Negative for mouth dryness.   Eyes:  Negative for dryness.  Respiratory:  Negative for shortness of breath.   Cardiovascular:  Positive for swelling in legs/feet.  Gastrointestinal:  Positive for constipation.  Endocrine: Positive for heat intolerance and increased urination.  Genitourinary:  Negative for difficulty urinating.  Musculoskeletal:  Positive for joint pain,  joint pain, morning stiffness and muscle tenderness. Negative for joint swelling.  Skin:  Negative for color change, rash and sensitivity to sunlight.  Allergic/Immunologic: Negative for susceptible to infections.  Neurological:  Negative for numbness.  Hematological:  Positive for bruising/bleeding tendency.  Psychiatric/Behavioral:  Positive for sleep disturbance.    PMFS History:  Patient Active Problem List   Diagnosis Date Noted   S/P total knee arthroplasty, right 09/18/2020   Benign hypertension 08/02/2018   Chronic back pain 08/02/2018   Diabetes mellitus ([place]) 08/02/2018   Hyperlipidemia 08/02/2018   Plantar fasciitis 07/11/2018   Acquired trigger finger 03/17/2018   Primary osteoarthritis of both knees 11/16/2017   DDD (degenerative disc disease), lumbar 11/16/2017   History of asthma 11/16/2017   History of gastroesophageal reflux (GERD) 11/16/2017   History of hypertension 11/16/2017   History of neuropathy 11/16/2017   History of hypercholesterolemia 11/16/2017   Mass of hand 11/09/2017   Pain in right hand 11/09/2017   Trigger finger 11/09/2017   Fibromyalgia 07/26/2016   Greater trochanteric bursitis of both hips 07/26/2016   Chronic fatigue 07/26/2016   Other insomnia 07/26/2016   Rectal bleeding 01/31/2014    Past Medical History:  Diagnosis Date   Arthritis    RIGHT KNEE   Asthma    Chronic back pain    DDD (degenerative disc disease), lumbar    Diabetes mellitus type 2, diet-controlled ([place])    pt denies    Fibromyalgia    GERD (gastroesophageal reflux disease)  Hemorrhoids    INTERNAL AND EXTERNAL   History of colon polyps    2009-- BENIGN   Hyperlipidemia    Hypertension    PONV (postoperative nausea and vomiting)    Right knee meniscal tear     Family History  Problem Relation Age of Onset   Heart disease Mother    Heart attack Mother    Arthritis Father    Diabetes Sister    Diabetes Brother    Hypertension Brother    Stomach  cancer Brother    Past Surgical History:  Procedure Laterality Date   CARDIOVASCULAR STRESS TEST  07-06-2013   no ischemia or infarct/  normal LV function and wall motion , ef 63%   COLONOSCOPY W/ POLYPECTOMY  01-10-2008   KNEE ARTHROSCOPY Right 10-14-2006   KNEE ARTHROSCOPY WITH LATERAL MENISECTOMY Right 05/18/2015   Procedure: KNEE ARTHROSCOPY WITH PARTIAL  LATERAL MENISECTOMY;  Surgeon: Paralee Cancel, MD;  Location: [facility];  Service: Orthopedics;  Laterality: Right;   KNEE ARTHROSCOPY WITH MEDIAL MENISECTOMY Right 05/18/2015   Procedure: KNEE ARTHROSCOPY WITH PARTIAL  MEDIAL MENISECTOMY;  Surgeon: Paralee Cancel, MD;  Location: [facility];  Service: Orthopedics;  Laterality: Right;   LIPOMA EXCISION Right 01/2018   right thumb lipoma    PULLEY RELEASE RIGHT LONG AND SMALL FINGERS  01-30-2006   RECONSTRUCTION OF ELBOW Bilateral right 06-03-2005  &  left  06-12-2011   ROBOTIC ASSITED PARTIAL NEPHRECTOMY Left 10-16-2008   oncocytoma ( negative neoplasm)   SHOULDER ARTHROSCOPY W/ SUBACROMIAL DECOMPRESSION AND DISTAL CLAVICLE EXCISION Left 07-07-2007   and ROTATOR CUFF REPAIR   SIGMOIDOSCOPY  02-02-2014   TOTAL ABDOMINAL HYSTERECTOMY W/ BILATERAL SALPINGOOPHORECTOMY  1979   TOTAL KNEE ARTHROPLASTY Right 09/18/2020   Procedure: TOTAL KNEE ARTHROPLASTY;  Surgeon: Paralee Cancel, MD;  Location: [facility];  Service: Orthopedics;  Laterality: Right;  70 mins   Social History   Social History Narrative   Not on file   Immunization History  Administered Date(s) Administered   Influenza,inj,Quad PF,6+ Mos 06/30/2018   Moderna Sars-Covid-2 Vaccination 11/05/2019, 12/04/2019, 08/22/2020, 02/06/2021, 07/26/2021     Objective: Vital Signs: BP 113/67 (BP Location: Left Arm, Patient Position: Sitting, Cuff Size: Normal)   Pulse 67   Resp 15   Ht 5\' 6"  (1.676 m)   Wt 161 lb (73 kg)   BMI 25.99 kg/m    Physical Exam Vitals and nursing note reviewed.   Constitutional:      Appearance: She is well-developed.  HENT:     Head: Normocephalic and atraumatic.  Eyes:     Conjunctiva/sclera: Conjunctivae normal.  Cardiovascular:     Rate and Rhythm: Normal rate and regular rhythm.     Heart sounds: Normal heart sounds.  Pulmonary:     Effort: Pulmonary effort is normal.     Breath sounds: Normal breath sounds.  Abdominal:     General: Bowel sounds are normal.     Palpations: Abdomen is soft.  Musculoskeletal:     Cervical back: Normal range of motion.  Lymphadenopathy:     Cervical: No cervical adenopathy.  Skin:    General: Skin is warm and dry.     Capillary Refill: Capillary refill takes less than 2 seconds.  Neurological:     Mental Status: She is alert and oriented to person, place, and time.  Psychiatric:        Behavior: Behavior normal.     Musculoskeletal Exam: C-spine was in good range of motion.  She had bilateral trapezius spasm.  She had painful limited range of motion of the lumbar spine.  Shoulder joints, elbow joints, wrist joints, MCPs PIPs and DIPs with good range of motion with no synovitis.  She had thickening of the right ring finger flexor tendon.  Hip joints were in good range of motion.  Right knee joint was replaced and warm to touch.  She had limited extension.  Left knee joint was in good range of motion.  There was no tenderness over ankles or MTPs.  CDAI Exam: CDAI Score: -- Patient Global: --; Provider Global: -- Swollen: --; Tender: -- Joint Exam 02/11/2022   No joint exam has been documented for this visit   There is currently no information documented on the homunculus. Go to the Rheumatology activity and complete the homunculus joint exam.  Investigation: No additional findings.  Imaging: No results found.  Recent Labs: Lab Results  Component Value Date   WBC 12.2 (H) 09/19/2020   HGB 10.7 (L) 09/19/2020   PLT 175 09/19/2020   NA 137 09/19/2020   K 3.9 09/19/2020   CL 104 09/19/2020    CO2 25 09/19/2020   GLUCOSE 130 (H) 09/19/2020   BUN 19 09/19/2020   CREATININE 0.97 09/19/2020   BILITOT 0.3 06/28/2013   ALKPHOS 68 06/28/2013   AST 14 06/28/2013   ALT 11 06/28/2013   PROT 7.6 06/28/2013   ALBUMIN 3.8 06/28/2013   CALCIUM 8.9 09/19/2020   GFRAA >60 05/08/2015    Speciality Comments: No specialty comments available.  Procedures:  No procedures performed Allergies: Dilaudid [hydromorphone], Levofloxacin, Macrobid [nitrofurantoin macrocrystal], Oxycodone-acetaminophen, Penicillin g benzathine, Seasonal ic [cholestatin], Cyclobenzaprine, Penicillins, Percocet [oxycodone-acetaminophen], and Zanaflex [tizanidine hcl]   Assessment / Plan:     Visit Diagnoses: Fibromyalgia-she continues to have some generalized pain and discomfort.  She had positive tender points.  Need for regular exercise and stretching was discussed.  Other insomnia -her insomnia symptoms are well controlled on trazodone 50 mg 1 tablet by mouth at bedtime and melatonin 10 mg by mouth at bedtime.  Good sleep hygiene was discussed.  Chronic fatigue-she continues to have some fatigue from insomnia and fibromyalgia.  Trapezius muscle spasm-she had bilateral trapezius spasm.  Use of over-the-counter lidocaine patches was discussed.  I would avoid cortisone injection as she recently had cortisone injection and Toradol.  Trigger finger, right ring finger - She had 3 injections by Dr. Amedeo Plenty in the past.  She still has intermittent symptoms.  Greater trochanteric bursitis of both hips-patient states she had right trochanteric bursa injection recently.  She was also given Toradol and prednisone taper due to ongoing back pain.  Status post right knee replacement - Performed on 09/18/20 by Dr. Alvan Dame.  She had warmth on palpation with limited extension.  Primary osteoarthritis of left knee-she denied any discomfort today.  DDD (degenerative disc disease), lumbar - Status post fusion.  She is followed by Dr.  Patrice Paradise.  She continues to have lower back pain.  She had prednisone taper and Toradol injection which gave her some relief.  History of hypertension-blood pressure was normal today.  Patient states she recently had pedal edema which resolved.  Which could be related to Toradol and prednisone.  History of hypercholesterolemia  History of gastroesophageal reflux (GERD)  History of asthma  Orders: No orders of the defined types were placed in this encounter.  No orders of the defined types were placed in this encounter.    Follow-Up Instructions: Return in about 6  months (around 08/14/2022) for Osteoarthritis.   Bo Merino, MD  Note - This record has been created using Editor, commissioning.  Chart creation errors have been sought, but may not always  have been located. Such creation errors do not reflect on  the standard of medical care.

## 2022-02-03 DIAGNOSIS — M545 Low back pain, unspecified: Secondary | ICD-10-CM | POA: Diagnosis not present

## 2022-02-03 DIAGNOSIS — M4326 Fusion of spine, lumbar region: Secondary | ICD-10-CM | POA: Diagnosis not present

## 2022-02-03 DIAGNOSIS — M5106 Intervertebral disc disorders with myelopathy, lumbar region: Secondary | ICD-10-CM | POA: Diagnosis not present

## 2022-02-11 ENCOUNTER — Ambulatory Visit: Payer: Medicare Other | Admitting: Rheumatology

## 2022-02-11 ENCOUNTER — Encounter: Payer: Self-pay | Admitting: Rheumatology

## 2022-02-11 VITALS — BP 113/67 | HR 67 | Resp 15 | Ht 66.0 in | Wt 161.0 lb

## 2022-02-11 DIAGNOSIS — Z8719 Personal history of other diseases of the digestive system: Secondary | ICD-10-CM

## 2022-02-11 DIAGNOSIS — M5136 Other intervertebral disc degeneration, lumbar region: Secondary | ICD-10-CM

## 2022-02-11 DIAGNOSIS — M7062 Trochanteric bursitis, left hip: Secondary | ICD-10-CM

## 2022-02-11 DIAGNOSIS — Z96651 Presence of right artificial knee joint: Secondary | ICD-10-CM

## 2022-02-11 DIAGNOSIS — G4709 Other insomnia: Secondary | ICD-10-CM

## 2022-02-11 DIAGNOSIS — M1712 Unilateral primary osteoarthritis, left knee: Secondary | ICD-10-CM

## 2022-02-11 DIAGNOSIS — M62838 Other muscle spasm: Secondary | ICD-10-CM | POA: Diagnosis not present

## 2022-02-11 DIAGNOSIS — R5382 Chronic fatigue, unspecified: Secondary | ICD-10-CM | POA: Diagnosis not present

## 2022-02-11 DIAGNOSIS — M797 Fibromyalgia: Secondary | ICD-10-CM

## 2022-02-11 DIAGNOSIS — Z8639 Personal history of other endocrine, nutritional and metabolic disease: Secondary | ICD-10-CM

## 2022-02-11 DIAGNOSIS — M7061 Trochanteric bursitis, right hip: Secondary | ICD-10-CM

## 2022-02-11 DIAGNOSIS — Z8709 Personal history of other diseases of the respiratory system: Secondary | ICD-10-CM

## 2022-02-11 DIAGNOSIS — M65341 Trigger finger, right ring finger: Secondary | ICD-10-CM

## 2022-02-11 DIAGNOSIS — Z8679 Personal history of other diseases of the circulatory system: Secondary | ICD-10-CM

## 2022-02-14 DIAGNOSIS — J3081 Allergic rhinitis due to animal (cat) (dog) hair and dander: Secondary | ICD-10-CM | POA: Diagnosis not present

## 2022-02-14 DIAGNOSIS — J301 Allergic rhinitis due to pollen: Secondary | ICD-10-CM | POA: Diagnosis not present

## 2022-02-14 DIAGNOSIS — J3089 Other allergic rhinitis: Secondary | ICD-10-CM | POA: Diagnosis not present

## 2022-02-18 DIAGNOSIS — N958 Other specified menopausal and perimenopausal disorders: Secondary | ICD-10-CM | POA: Diagnosis not present

## 2022-02-18 DIAGNOSIS — R29890 Loss of height: Secondary | ICD-10-CM | POA: Diagnosis not present

## 2022-02-18 DIAGNOSIS — M816 Localized osteoporosis [Lequesne]: Secondary | ICD-10-CM | POA: Diagnosis not present

## 2022-02-25 DIAGNOSIS — J301 Allergic rhinitis due to pollen: Secondary | ICD-10-CM | POA: Diagnosis not present

## 2022-02-25 DIAGNOSIS — J3089 Other allergic rhinitis: Secondary | ICD-10-CM | POA: Diagnosis not present

## 2022-02-25 DIAGNOSIS — J3081 Allergic rhinitis due to animal (cat) (dog) hair and dander: Secondary | ICD-10-CM | POA: Diagnosis not present

## 2022-03-04 DIAGNOSIS — M4326 Fusion of spine, lumbar region: Secondary | ICD-10-CM | POA: Diagnosis not present

## 2022-03-04 DIAGNOSIS — M545 Low back pain, unspecified: Secondary | ICD-10-CM | POA: Diagnosis not present

## 2022-03-16 ENCOUNTER — Other Ambulatory Visit: Payer: Self-pay | Admitting: Rheumatology

## 2022-03-18 DIAGNOSIS — J3081 Allergic rhinitis due to animal (cat) (dog) hair and dander: Secondary | ICD-10-CM | POA: Diagnosis not present

## 2022-03-18 DIAGNOSIS — J3089 Other allergic rhinitis: Secondary | ICD-10-CM | POA: Diagnosis not present

## 2022-03-18 DIAGNOSIS — J301 Allergic rhinitis due to pollen: Secondary | ICD-10-CM | POA: Diagnosis not present

## 2022-03-19 DIAGNOSIS — M810 Age-related osteoporosis without current pathological fracture: Secondary | ICD-10-CM | POA: Diagnosis not present

## 2022-04-01 DIAGNOSIS — J301 Allergic rhinitis due to pollen: Secondary | ICD-10-CM | POA: Diagnosis not present

## 2022-04-01 DIAGNOSIS — J3081 Allergic rhinitis due to animal (cat) (dog) hair and dander: Secondary | ICD-10-CM | POA: Diagnosis not present

## 2022-04-01 DIAGNOSIS — J3089 Other allergic rhinitis: Secondary | ICD-10-CM | POA: Diagnosis not present

## 2022-04-04 DIAGNOSIS — M810 Age-related osteoporosis without current pathological fracture: Secondary | ICD-10-CM | POA: Diagnosis not present

## 2022-04-07 DIAGNOSIS — M545 Low back pain, unspecified: Secondary | ICD-10-CM | POA: Diagnosis not present

## 2022-04-07 DIAGNOSIS — M4326 Fusion of spine, lumbar region: Secondary | ICD-10-CM | POA: Diagnosis not present

## 2022-04-09 DIAGNOSIS — J301 Allergic rhinitis due to pollen: Secondary | ICD-10-CM | POA: Diagnosis not present

## 2022-04-09 DIAGNOSIS — J3089 Other allergic rhinitis: Secondary | ICD-10-CM | POA: Diagnosis not present

## 2022-04-09 DIAGNOSIS — J3081 Allergic rhinitis due to animal (cat) (dog) hair and dander: Secondary | ICD-10-CM | POA: Diagnosis not present

## 2022-04-15 DIAGNOSIS — J3089 Other allergic rhinitis: Secondary | ICD-10-CM | POA: Diagnosis not present

## 2022-04-15 DIAGNOSIS — J301 Allergic rhinitis due to pollen: Secondary | ICD-10-CM | POA: Diagnosis not present

## 2022-04-15 DIAGNOSIS — J3081 Allergic rhinitis due to animal (cat) (dog) hair and dander: Secondary | ICD-10-CM | POA: Diagnosis not present

## 2022-04-29 DIAGNOSIS — J3089 Other allergic rhinitis: Secondary | ICD-10-CM | POA: Diagnosis not present

## 2022-04-29 DIAGNOSIS — J3081 Allergic rhinitis due to animal (cat) (dog) hair and dander: Secondary | ICD-10-CM | POA: Diagnosis not present

## 2022-04-29 DIAGNOSIS — J301 Allergic rhinitis due to pollen: Secondary | ICD-10-CM | POA: Diagnosis not present

## 2022-05-07 DIAGNOSIS — H25813 Combined forms of age-related cataract, bilateral: Secondary | ICD-10-CM | POA: Diagnosis not present

## 2022-05-07 DIAGNOSIS — H43813 Vitreous degeneration, bilateral: Secondary | ICD-10-CM | POA: Diagnosis not present

## 2022-05-16 DIAGNOSIS — U071 COVID-19: Secondary | ICD-10-CM | POA: Diagnosis not present

## 2022-05-16 DIAGNOSIS — J069 Acute upper respiratory infection, unspecified: Secondary | ICD-10-CM | POA: Diagnosis not present

## 2022-05-16 DIAGNOSIS — J45909 Unspecified asthma, uncomplicated: Secondary | ICD-10-CM | POA: Diagnosis not present

## 2022-06-03 DIAGNOSIS — J3089 Other allergic rhinitis: Secondary | ICD-10-CM | POA: Diagnosis not present

## 2022-06-03 DIAGNOSIS — J3081 Allergic rhinitis due to animal (cat) (dog) hair and dander: Secondary | ICD-10-CM | POA: Diagnosis not present

## 2022-06-03 DIAGNOSIS — J301 Allergic rhinitis due to pollen: Secondary | ICD-10-CM | POA: Diagnosis not present

## 2022-06-09 DIAGNOSIS — M5106 Intervertebral disc disorders with myelopathy, lumbar region: Secondary | ICD-10-CM | POA: Diagnosis not present

## 2022-06-09 DIAGNOSIS — M4326 Fusion of spine, lumbar region: Secondary | ICD-10-CM | POA: Diagnosis not present

## 2022-06-09 DIAGNOSIS — Z6830 Body mass index (BMI) 30.0-30.9, adult: Secondary | ICD-10-CM | POA: Diagnosis not present

## 2022-06-09 DIAGNOSIS — M545 Low back pain, unspecified: Secondary | ICD-10-CM | POA: Diagnosis not present

## 2022-06-10 DIAGNOSIS — J3089 Other allergic rhinitis: Secondary | ICD-10-CM | POA: Diagnosis not present

## 2022-06-10 DIAGNOSIS — J301 Allergic rhinitis due to pollen: Secondary | ICD-10-CM | POA: Diagnosis not present

## 2022-06-10 DIAGNOSIS — J3081 Allergic rhinitis due to animal (cat) (dog) hair and dander: Secondary | ICD-10-CM | POA: Diagnosis not present

## 2022-06-17 DIAGNOSIS — J301 Allergic rhinitis due to pollen: Secondary | ICD-10-CM | POA: Diagnosis not present

## 2022-06-17 DIAGNOSIS — J3081 Allergic rhinitis due to animal (cat) (dog) hair and dander: Secondary | ICD-10-CM | POA: Diagnosis not present

## 2022-06-17 DIAGNOSIS — Z23 Encounter for immunization: Secondary | ICD-10-CM | POA: Diagnosis not present

## 2022-06-17 DIAGNOSIS — J3089 Other allergic rhinitis: Secondary | ICD-10-CM | POA: Diagnosis not present

## 2022-06-20 ENCOUNTER — Other Ambulatory Visit: Payer: Self-pay | Admitting: Rheumatology

## 2022-06-23 NOTE — Telephone Encounter (Signed)
Next Visit: 08/19/2022  Last Visit: 02/11/2022  Last Fill: 03/17/2022  DX: Other insomnia  Current Dose per office note on 02/11/2022: trazodone 50 mg 1 tablet by mouth at bedtime  Okay to refill trazodone?

## 2022-07-02 DIAGNOSIS — J3081 Allergic rhinitis due to animal (cat) (dog) hair and dander: Secondary | ICD-10-CM | POA: Diagnosis not present

## 2022-07-02 DIAGNOSIS — J3089 Other allergic rhinitis: Secondary | ICD-10-CM | POA: Diagnosis not present

## 2022-07-02 DIAGNOSIS — J301 Allergic rhinitis due to pollen: Secondary | ICD-10-CM | POA: Diagnosis not present

## 2022-07-08 DIAGNOSIS — J301 Allergic rhinitis due to pollen: Secondary | ICD-10-CM | POA: Diagnosis not present

## 2022-07-08 DIAGNOSIS — J3081 Allergic rhinitis due to animal (cat) (dog) hair and dander: Secondary | ICD-10-CM | POA: Diagnosis not present

## 2022-07-08 DIAGNOSIS — J3089 Other allergic rhinitis: Secondary | ICD-10-CM | POA: Diagnosis not present

## 2022-07-09 DIAGNOSIS — R7303 Prediabetes: Secondary | ICD-10-CM | POA: Diagnosis not present

## 2022-07-09 DIAGNOSIS — M25561 Pain in right knee: Secondary | ICD-10-CM | POA: Diagnosis not present

## 2022-07-09 DIAGNOSIS — E559 Vitamin D deficiency, unspecified: Secondary | ICD-10-CM | POA: Diagnosis not present

## 2022-07-09 DIAGNOSIS — Z96651 Presence of right artificial knee joint: Secondary | ICD-10-CM | POA: Diagnosis not present

## 2022-07-09 DIAGNOSIS — E785 Hyperlipidemia, unspecified: Secondary | ICD-10-CM | POA: Diagnosis not present

## 2022-07-09 DIAGNOSIS — I10 Essential (primary) hypertension: Secondary | ICD-10-CM | POA: Diagnosis not present

## 2022-07-14 DIAGNOSIS — E1165 Type 2 diabetes mellitus with hyperglycemia: Secondary | ICD-10-CM | POA: Diagnosis not present

## 2022-07-14 DIAGNOSIS — E782 Mixed hyperlipidemia: Secondary | ICD-10-CM | POA: Diagnosis not present

## 2022-07-14 DIAGNOSIS — E559 Vitamin D deficiency, unspecified: Secondary | ICD-10-CM | POA: Diagnosis not present

## 2022-07-14 DIAGNOSIS — I10 Essential (primary) hypertension: Secondary | ICD-10-CM | POA: Diagnosis not present

## 2022-07-25 DIAGNOSIS — J301 Allergic rhinitis due to pollen: Secondary | ICD-10-CM | POA: Diagnosis not present

## 2022-07-25 DIAGNOSIS — J3089 Other allergic rhinitis: Secondary | ICD-10-CM | POA: Diagnosis not present

## 2022-07-25 DIAGNOSIS — J3081 Allergic rhinitis due to animal (cat) (dog) hair and dander: Secondary | ICD-10-CM | POA: Diagnosis not present

## 2022-07-28 DIAGNOSIS — Z23 Encounter for immunization: Secondary | ICD-10-CM | POA: Diagnosis not present

## 2022-08-04 DIAGNOSIS — J301 Allergic rhinitis due to pollen: Secondary | ICD-10-CM | POA: Diagnosis not present

## 2022-08-04 DIAGNOSIS — J3089 Other allergic rhinitis: Secondary | ICD-10-CM | POA: Diagnosis not present

## 2022-08-04 DIAGNOSIS — J3081 Allergic rhinitis due to animal (cat) (dog) hair and dander: Secondary | ICD-10-CM | POA: Diagnosis not present

## 2022-08-05 NOTE — Progress Notes (Deleted)
Office Visit Note  Patient: [name]             Date of Birth: June 20, 1956           MRN: [id number]             PCP: Lewis Moccasin, MD Referring: Lewis Moccasin, MD Visit Date: 08/19/2022 Occupation: @GUAROCC @  Subjective:    History of Present Illness: [name] is a 66 y.o. female with history of fibromyalgia, osteoarthritis, and DDD.  Trazodone   Activities of Daily Living:  Patient reports morning stiffness for *** {minute/hour:19697}.   Patient {ACTIONS;DENIES/REPORTS:21021675::"Denies"} nocturnal pain.  Difficulty dressing/grooming: {ACTIONS;DENIES/REPORTS:21021675::"Denies"} Difficulty climbing stairs: {ACTIONS;DENIES/REPORTS:21021675::"Denies"} Difficulty getting out of chair: {ACTIONS;DENIES/REPORTS:21021675::"Denies"} Difficulty using hands for taps, buttons, cutlery, and/or writing: {ACTIONS;DENIES/REPORTS:21021675::"Denies"}  No Rheumatology ROS completed.   PMFS History:  Patient Active Problem List   Diagnosis Date Noted   S/P total knee arthroplasty, right 09/18/2020   Benign hypertension 08/02/2018   Chronic back pain 08/02/2018   Diabetes mellitus ([facility]) 08/02/2018   Hyperlipidemia 08/02/2018   Plantar fasciitis 07/11/2018   Acquired trigger finger 03/17/2018   Primary osteoarthritis of both knees 11/16/2017   DDD (degenerative disc disease), lumbar 11/16/2017   History of asthma 11/16/2017   History of gastroesophageal reflux (GERD) 11/16/2017   History of hypertension 11/16/2017   History of neuropathy 11/16/2017   History of hypercholesterolemia 11/16/2017   Mass of hand 11/09/2017   Pain in right hand 11/09/2017   Trigger finger 11/09/2017   Fibromyalgia 07/26/2016   Greater trochanteric bursitis of both hips 07/26/2016   Chronic fatigue 07/26/2016   Other insomnia 07/26/2016   Rectal bleeding 01/31/2014    Past Medical History:  Diagnosis Date   Arthritis    RIGHT KNEE   Asthma    Chronic back pain    DDD  (degenerative disc disease), lumbar    Diabetes mellitus type 2, diet-controlled ([facility])    pt denies    Fibromyalgia    GERD (gastroesophageal reflux disease)    Hemorrhoids    INTERNAL AND EXTERNAL   History of colon polyps    2009-- BENIGN   Hyperlipidemia    Hypertension    PONV (postoperative nausea and vomiting)    Right knee meniscal tear     Family History  Problem Relation Age of Onset   Heart disease Mother    Heart attack Mother    Arthritis Father    Diabetes Sister    Diabetes Brother    Hypertension Brother    Stomach cancer Brother    Past Surgical History:  Procedure Laterality Date   CARDIOVASCULAR STRESS TEST  07-06-2013   no ischemia or infarct/  normal LV function and wall motion , ef 63%   COLONOSCOPY W/ POLYPECTOMY  01-10-2008   KNEE ARTHROSCOPY Right 10-14-2006   KNEE ARTHROSCOPY WITH LATERAL MENISECTOMY Right 05/18/2015   Procedure: KNEE ARTHROSCOPY WITH PARTIAL  LATERAL MENISECTOMY;  Surgeon: 05/20/2015, MD;  Location: [facility];  Service: Orthopedics;  Laterality: Right;   KNEE ARTHROSCOPY WITH MEDIAL MENISECTOMY Right 05/18/2015   Procedure: KNEE ARTHROSCOPY WITH PARTIAL  MEDIAL MENISECTOMY;  Surgeon: 05/20/2015, MD;  Location: [facility];  Service: Orthopedics;  Laterality: Right;   LIPOMA EXCISION Right 01/2018   right thumb lipoma    PULLEY RELEASE RIGHT LONG AND SMALL FINGERS  01-30-2006   RECONSTRUCTION OF ELBOW Bilateral right 06-03-2005  &  left  06-12-2011   ROBOTIC ASSITED PARTIAL NEPHRECTOMY  Left 10-16-2008   oncocytoma ( negative neoplasm)   SHOULDER ARTHROSCOPY W/ SUBACROMIAL DECOMPRESSION AND DISTAL CLAVICLE EXCISION Left 07-07-2007   and ROTATOR CUFF REPAIR   SIGMOIDOSCOPY  02-02-2014   TOTAL ABDOMINAL HYSTERECTOMY W/ BILATERAL SALPINGOOPHORECTOMY  1979   TOTAL KNEE ARTHROPLASTY Right 09/18/2020   Procedure: TOTAL KNEE ARTHROPLASTY;  Surgeon: Durene Romans, MD;  Location: [facility];  Service:  Orthopedics;  Laterality: Right;  70 mins   Social History   Social History Narrative   Not on file   Immunization History  Administered Date(s) Administered   Influenza,inj,Quad PF,6+ Mos 06/30/2018   Moderna Sars-Covid-2 Vaccination 11/05/2019, 12/04/2019, 08/22/2020, 02/06/2021, 07/26/2021     Objective: Vital Signs: There were no vitals taken for this visit.   Physical Exam Vitals and nursing note reviewed.  Constitutional:      Appearance: She is well-developed.  HENT:     Head: Normocephalic and atraumatic.  Eyes:     Conjunctiva/sclera: Conjunctivae normal.  Cardiovascular:     Rate and Rhythm: Normal rate and regular rhythm.     Heart sounds: Normal heart sounds.  Pulmonary:     Effort: Pulmonary effort is normal.     Breath sounds: Normal breath sounds.  Abdominal:     General: Bowel sounds are normal.     Palpations: Abdomen is soft.  Musculoskeletal:     Cervical back: Normal range of motion.  Skin:    General: Skin is warm and dry.     Capillary Refill: Capillary refill takes less than 2 seconds.  Neurological:     Mental Status: She is alert and oriented to person, place, and time.  Psychiatric:        Behavior: Behavior normal.      Musculoskeletal Exam: ***  CDAI Exam: CDAI Score: -- Patient Global: --; Provider Global: -- Swollen: --; Tender: -- Joint Exam 08/19/2022   No joint exam has been documented for this visit   There is currently no information documented on the homunculus. Go to the Rheumatology activity and complete the homunculus joint exam.  Investigation: No additional findings.  Imaging: No results found.  Recent Labs: Lab Results  Component Value Date   WBC 12.2 (H) 09/19/2020   HGB 10.7 (L) 09/19/2020   PLT 175 09/19/2020   NA 137 09/19/2020   K 3.9 09/19/2020   CL 104 09/19/2020   CO2 25 09/19/2020   GLUCOSE 130 (H) 09/19/2020   BUN 19 09/19/2020   CREATININE 0.97 09/19/2020   BILITOT 0.3 06/28/2013    ALKPHOS 68 06/28/2013   AST 14 06/28/2013   ALT 11 06/28/2013   PROT 7.6 06/28/2013   ALBUMIN 3.8 06/28/2013   CALCIUM 8.9 09/19/2020   GFRAA >60 05/08/2015    Speciality Comments: No specialty comments available.  Procedures:  No procedures performed Allergies: Dilaudid [hydromorphone], Levofloxacin, Macrobid [nitrofurantoin macrocrystal], Oxycodone-acetaminophen, Penicillin g benzathine, Seasonal ic [cholestatin], Cyclobenzaprine, Penicillins, Percocet [oxycodone-acetaminophen], and Zanaflex [tizanidine hcl]   Assessment / Plan:     Visit Diagnoses: No diagnosis found.  Orders: No orders of the defined types were placed in this encounter.  No orders of the defined types were placed in this encounter.   Face-to-face time spent with patient was *** minutes. Greater than 50% of time was spent in counseling and coordination of care.  Follow-Up Instructions: No follow-ups on file.   Ellen Henri, CMA  Note - This record has been created using Animal nutritionist.  Chart creation errors have been sought, but may not  always  have been located. Such creation errors do not reflect on  the standard of medical care.

## 2022-08-11 DIAGNOSIS — M545 Low back pain, unspecified: Secondary | ICD-10-CM | POA: Diagnosis not present

## 2022-08-11 DIAGNOSIS — M5106 Intervertebral disc disorders with myelopathy, lumbar region: Secondary | ICD-10-CM | POA: Diagnosis not present

## 2022-08-11 DIAGNOSIS — M4326 Fusion of spine, lumbar region: Secondary | ICD-10-CM | POA: Diagnosis not present

## 2022-08-12 DIAGNOSIS — J3081 Allergic rhinitis due to animal (cat) (dog) hair and dander: Secondary | ICD-10-CM | POA: Diagnosis not present

## 2022-08-12 DIAGNOSIS — J301 Allergic rhinitis due to pollen: Secondary | ICD-10-CM | POA: Diagnosis not present

## 2022-08-12 DIAGNOSIS — D519 Vitamin B12 deficiency anemia, unspecified: Secondary | ICD-10-CM | POA: Diagnosis not present

## 2022-08-12 DIAGNOSIS — J3089 Other allergic rhinitis: Secondary | ICD-10-CM | POA: Diagnosis not present

## 2022-08-19 ENCOUNTER — Ambulatory Visit: Payer: Medicare Other | Admitting: Physician Assistant

## 2022-08-19 DIAGNOSIS — M62838 Other muscle spasm: Secondary | ICD-10-CM

## 2022-08-19 DIAGNOSIS — H1045 Other chronic allergic conjunctivitis: Secondary | ICD-10-CM | POA: Diagnosis not present

## 2022-08-19 DIAGNOSIS — J4530 Mild persistent asthma, uncomplicated: Secondary | ICD-10-CM | POA: Diagnosis not present

## 2022-08-19 DIAGNOSIS — M5136 Other intervertebral disc degeneration, lumbar region: Secondary | ICD-10-CM

## 2022-08-19 DIAGNOSIS — M1712 Unilateral primary osteoarthritis, left knee: Secondary | ICD-10-CM

## 2022-08-19 DIAGNOSIS — M797 Fibromyalgia: Secondary | ICD-10-CM

## 2022-08-19 DIAGNOSIS — J3089 Other allergic rhinitis: Secondary | ICD-10-CM | POA: Diagnosis not present

## 2022-08-19 DIAGNOSIS — Z8679 Personal history of other diseases of the circulatory system: Secondary | ICD-10-CM

## 2022-08-19 DIAGNOSIS — J3081 Allergic rhinitis due to animal (cat) (dog) hair and dander: Secondary | ICD-10-CM | POA: Diagnosis not present

## 2022-08-19 DIAGNOSIS — Z8709 Personal history of other diseases of the respiratory system: Secondary | ICD-10-CM

## 2022-08-19 DIAGNOSIS — M65341 Trigger finger, right ring finger: Secondary | ICD-10-CM

## 2022-08-19 DIAGNOSIS — G4709 Other insomnia: Secondary | ICD-10-CM

## 2022-08-19 DIAGNOSIS — Z8639 Personal history of other endocrine, nutritional and metabolic disease: Secondary | ICD-10-CM

## 2022-08-19 DIAGNOSIS — Z96651 Presence of right artificial knee joint: Secondary | ICD-10-CM

## 2022-08-19 DIAGNOSIS — M7061 Trochanteric bursitis, right hip: Secondary | ICD-10-CM

## 2022-08-19 DIAGNOSIS — R5382 Chronic fatigue, unspecified: Secondary | ICD-10-CM

## 2022-08-19 DIAGNOSIS — J301 Allergic rhinitis due to pollen: Secondary | ICD-10-CM | POA: Diagnosis not present

## 2022-08-19 DIAGNOSIS — Z8719 Personal history of other diseases of the digestive system: Secondary | ICD-10-CM

## 2022-08-20 DIAGNOSIS — K219 Gastro-esophageal reflux disease without esophagitis: Secondary | ICD-10-CM | POA: Diagnosis not present

## 2022-08-20 DIAGNOSIS — R748 Abnormal levels of other serum enzymes: Secondary | ICD-10-CM | POA: Diagnosis not present

## 2022-08-20 DIAGNOSIS — Z6826 Body mass index (BMI) 26.0-26.9, adult: Secondary | ICD-10-CM | POA: Diagnosis not present

## 2022-08-20 DIAGNOSIS — I10 Essential (primary) hypertension: Secondary | ICD-10-CM | POA: Diagnosis not present

## 2022-08-26 DIAGNOSIS — J301 Allergic rhinitis due to pollen: Secondary | ICD-10-CM | POA: Diagnosis not present

## 2022-08-26 DIAGNOSIS — J3089 Other allergic rhinitis: Secondary | ICD-10-CM | POA: Diagnosis not present

## 2022-08-26 DIAGNOSIS — J3081 Allergic rhinitis due to animal (cat) (dog) hair and dander: Secondary | ICD-10-CM | POA: Diagnosis not present

## 2022-08-27 DIAGNOSIS — H25813 Combined forms of age-related cataract, bilateral: Secondary | ICD-10-CM | POA: Diagnosis not present

## 2022-08-27 DIAGNOSIS — H43813 Vitreous degeneration, bilateral: Secondary | ICD-10-CM | POA: Diagnosis not present

## 2022-08-27 DIAGNOSIS — H33311 Horseshoe tear of retina without detachment, right eye: Secondary | ICD-10-CM | POA: Diagnosis not present

## 2022-09-09 DIAGNOSIS — J301 Allergic rhinitis due to pollen: Secondary | ICD-10-CM | POA: Diagnosis not present

## 2022-09-09 DIAGNOSIS — J3081 Allergic rhinitis due to animal (cat) (dog) hair and dander: Secondary | ICD-10-CM | POA: Diagnosis not present

## 2022-09-09 DIAGNOSIS — J3089 Other allergic rhinitis: Secondary | ICD-10-CM | POA: Diagnosis not present

## 2022-09-10 NOTE — Progress Notes (Signed)
Office Visit Note  Patient: [name]             Date of Birth: [birth year]           MRN: [id number]             PCP: Fanny Bien, MD Referring: Fanny Bien, MD Visit Date: 09/23/2022 Occupation: @GUAROCC @  Subjective:  Trapezius muscle tension and tenderness bilaterally  History of Present Illness: [name] is a 66 y.o. female with history of fibromyalgia, osteoarthritis, and DDD.  Patient presents today experiencing a fibromyalgia flare.  She is having generalized myalgias and muscle tenderness.  She is having significant discomfort in bilateral trapezius muscles especially on the left side.  She has been experiencing muscle spasms intermittently.  She took a dose of methocarbamol 500 mg today to alleviate her symptoms.  She requested bilateral trigger point injections which have alleviated her symptoms in the past.  Patient takes Norco as needed for pain relief. She has been experiencing locking and tenderness of the right ring finger.  She requested to schedule an ultrasound-guided right ring trigger finger injection. She continues to take gabapentin 300 mg at bedtime and trazodone 50 mg 1 tablet at bedtime for insomnia.  She requested a refill of trazodone to be sent to the pharmacy.   Activities of Daily Living:  Patient reports morning stiffness for 35 minutes.   Patient Denies nocturnal pain.  Difficulty dressing/grooming: Denies Difficulty climbing stairs: Denies Difficulty getting out of chair: Denies Difficulty using hands for taps, buttons, cutlery, and/or writing: Reports  Review of Systems  Constitutional:  Positive for fatigue.  HENT:  Negative for mouth sores and mouth dryness.   Eyes:  Positive for dryness.  Respiratory:  Negative for shortness of breath.   Cardiovascular:  Negative for chest pain and palpitations.  Gastrointestinal:  Negative for blood in stool, constipation and diarrhea.  Endocrine: Negative for increased urination.   Genitourinary:  Negative for involuntary urination.  Musculoskeletal:  Positive for joint pain, joint pain, joint swelling, myalgias, muscle weakness, morning stiffness, muscle tenderness and myalgias. Negative for gait problem.  Skin:  Negative for color change, rash, hair loss and sensitivity to sunlight.  Allergic/Immunologic: Negative for susceptible to infections.  Neurological:  Positive for dizziness. Negative for headaches.  Hematological:  Negative for swollen glands.  Psychiatric/Behavioral:  Negative for depressed mood and sleep disturbance. The patient is not nervous/anxious.     PMFS History:  Patient Active Problem List   Diagnosis Date Noted   S/P total knee arthroplasty, right 09/18/2020   Benign hypertension 08/02/2018   Chronic back pain 08/02/2018   Diabetes mellitus ([place]) 08/02/2018   Hyperlipidemia 08/02/2018   Plantar fasciitis 07/11/2018   Acquired trigger finger 03/17/2018   Primary osteoarthritis of both knees 11/16/2017   DDD (degenerative disc disease), lumbar 11/16/2017   History of asthma 11/16/2017   History of gastroesophageal reflux (GERD) 11/16/2017   History of hypertension 11/16/2017   History of neuropathy 11/16/2017   History of hypercholesterolemia 11/16/2017   Mass of hand 11/09/2017   Pain in right hand 11/09/2017   Trigger finger 11/09/2017   Fibromyalgia 07/26/2016   Greater trochanteric bursitis of both hips 07/26/2016   Chronic fatigue 07/26/2016   Other insomnia 07/26/2016   Rectal bleeding 01/31/2014    Past Medical History:  Diagnosis Date   Arthritis    RIGHT KNEE   Asthma    Chronic back pain    DDD (degenerative disc disease),  lumbar    Diabetes mellitus type 2, diet-controlled ([place])    pt denies    Fibromyalgia    GERD (gastroesophageal reflux disease)    Hemorrhoids    INTERNAL AND EXTERNAL   History of colon polyps    2009-- BENIGN   Hyperlipidemia    Hypertension    PONV (postoperative nausea and vomiting)     Right knee meniscal tear     Family History  Problem Relation Age of Onset   Heart disease Mother    Heart attack Mother    Arthritis Father    Diabetes Sister    Diabetes Brother    Hypertension Brother    Stomach cancer Brother    Past Surgical History:  Procedure Laterality Date   CARDIOVASCULAR STRESS TEST  07-06-2013   no ischemia or infarct/  normal LV function and wall motion , ef 63%   COLONOSCOPY W/ POLYPECTOMY  01-10-2008   KNEE ARTHROSCOPY Right 10-14-2006   KNEE ARTHROSCOPY WITH LATERAL MENISECTOMY Right 05/18/2015   Procedure: KNEE ARTHROSCOPY WITH PARTIAL  LATERAL MENISECTOMY;  Surgeon: Paralee Cancel, MD;  Location: [place];  Service: Orthopedics;  Laterality: Right;   KNEE ARTHROSCOPY WITH MEDIAL MENISECTOMY Right 05/18/2015   Procedure: KNEE ARTHROSCOPY WITH PARTIAL  MEDIAL MENISECTOMY;  Surgeon: Paralee Cancel, MD;  Location: [facility];  Service: Orthopedics;  Laterality: Right;   LIPOMA EXCISION Right 01/2018   right thumb lipoma    PULLEY RELEASE RIGHT LONG AND SMALL FINGERS  01-30-2006   RECONSTRUCTION OF ELBOW Bilateral right 06-03-2005  &  left  06-12-2011   ROBOTIC ASSITED PARTIAL NEPHRECTOMY Left 10-16-2008   oncocytoma ( negative neoplasm)   SHOULDER ARTHROSCOPY W/ SUBACROMIAL DECOMPRESSION AND DISTAL CLAVICLE EXCISION Left 07-07-2007   and ROTATOR CUFF REPAIR   SIGMOIDOSCOPY  02-02-2014   TOTAL ABDOMINAL HYSTERECTOMY W/ BILATERAL SALPINGOOPHORECTOMY  1979   TOTAL KNEE ARTHROPLASTY Right 09/18/2020   Procedure: TOTAL KNEE ARTHROPLASTY;  Surgeon: Paralee Cancel, MD;  Location: [facility];  Service: Orthopedics;  Laterality: Right;  70 mins   Social History   Social History Narrative   Not on file   Immunization History  Administered Date(s) Administered   Influenza,inj,Quad PF,6+ Mos 06/30/2018   Moderna Sars-Covid-2 Vaccination 11/05/2019, 12/04/2019, 08/22/2020, 02/06/2021, 07/26/2021     Objective: Vital Signs: BP  101/62 (BP Location: Right Arm, Patient Position: Sitting, Cuff Size: Normal)   Pulse 86   Resp 15   Ht 5' 6.5" (1.689 m)   Wt 164 lb (74.4 kg)   BMI 26.07 kg/m    Physical Exam Vitals and nursing note reviewed.  Constitutional:      Appearance: She is well-developed.  HENT:     Head: Normocephalic and atraumatic.  Eyes:     Conjunctiva/sclera: Conjunctivae normal.  Cardiovascular:     Rate and Rhythm: Normal rate and regular rhythm.     Heart sounds: Normal heart sounds.  Pulmonary:     Effort: Pulmonary effort is normal.     Breath sounds: Normal breath sounds.  Abdominal:     General: Bowel sounds are normal.     Palpations: Abdomen is soft.  Musculoskeletal:     Cervical back: Normal range of motion.  Skin:    General: Skin is warm and dry.     Capillary Refill: Capillary refill takes less than 2 seconds.  Neurological:     Mental Status: She is alert and oriented to person, place, and time.  Psychiatric:  Behavior: Behavior normal.      Musculoskeletal Exam: Generalized hyperalgesia and positive tender points on exam.  C-spine has good range of motion.  Trapezius muscle tension and tenderness bilaterally.  No midline spinal tenderness.  Shoulder joints, elbow joints, wrist joints, MCPs, PIPs, DIPs have good range of motion with no synovitis.  Complete fist formation bilaterally.  Hip joints have good range of motion with no groin pain.  Tenderness over bilateral trochanteric bursa.  Right knee replacement has good range of motion with no warmth or effusion.  Left knee joint has good range of motion with no warmth or effusion.  Ankle joints have good range of motion with no tenderness or joint swelling.  CDAI Exam: CDAI Score: -- Patient Global: --; Provider Global: -- Swollen: --; Tender: -- Joint Exam 09/23/2022   No joint exam has been documented for this visit   There is currently no information documented on the homunculus. Go to the Rheumatology activity  and complete the homunculus joint exam.  Investigation: No additional findings.  Imaging: No results found.  Recent Labs: Lab Results  Component Value Date   WBC 12.2 (H) 09/19/2020   HGB 10.7 (L) 09/19/2020   PLT 175 09/19/2020   NA 137 09/19/2020   K 3.9 09/19/2020   CL 104 09/19/2020   CO2 25 09/19/2020   GLUCOSE 130 (H) 09/19/2020   BUN 19 09/19/2020   CREATININE 0.97 09/19/2020   BILITOT 0.3 06/28/2013   ALKPHOS 68 06/28/2013   AST 14 06/28/2013   ALT 11 06/28/2013   PROT 7.6 06/28/2013   ALBUMIN 3.8 06/28/2013   CALCIUM 8.9 09/19/2020   GFRAA >60 05/08/2015    Speciality Comments: No specialty comments available.  Procedures:  Trigger Point Inj  Date/Time: 09/23/2022 2:02 PM  Performed by: Gearldine Bienenstock, PA-C Authorized by: Gearldine Bienenstock, PA-C   Consent Given by:  Patient Site marked: the procedure site was marked   Timeout: prior to procedure the correct patient, procedure, and site was verified   Indications:  Pain Total # of Trigger Points:  2 Location: neck   Needle Size:  27 G Approach:  Dorsal Medications #1:  0.5 mL lidocaine 1 %; 10 mg triamcinolone acetonide 40 MG/ML Medications #2:  0.5 mL lidocaine 1 %; 10 mg triamcinolone acetonide 40 MG/ML Patient tolerance:  Patient tolerated the procedure well with no immediate complications  Allergies: Dilaudid [hydromorphone], Levofloxacin, Macrobid [nitrofurantoin macrocrystal], Oxycodone-acetaminophen, Penicillin g benzathine, Seasonal ic [cholestatin], Cyclobenzaprine, Penicillins, Percocet [oxycodone-acetaminophen], and Zanaflex [tizanidine hcl]   Assessment / Plan:     Visit Diagnoses: Fibromyalgia: Patient presents today experiencing a fibromyalgia flare.  She has been having generalized myalgias and muscle tenderness due to fibromyalgia.  She has been taking care of her 53-week-old grand son which she feels has exacerbated her symptoms.  She presents today with trapezius muscle tension and tenderness  bilaterally.  She has been experiencing muscle spasms intermittently.  She requested trigger point injections today which have alleviated her symptoms in the past.  She tolerated the procedures well.  Procedure notes were completed above.  Aftercare was discussed.  She plans on continuing to take Norco as needed for pain relief and will remain on trazodone and gabapentin at bedtime.  A refill of trazodone was sent to the pharmacy today.  Discussed the importance of regular exercise and good sleep hygiene.  She will follow-up in the office in 6 months or sooner if needed.  Chronic fatigue: Chronic, stable.  Discussed the  importance of regular exercise.  Other insomnia - She takes trazodone 50 mg 1 tablet by mouth at bedtime for insomnia.  Discussed the importance of good sleep hygiene.  A refill of trazodone was sent to the pharmacy today.  Trapezius muscle spasm: She has trapezius muscle tension and tenderness bilaterally.  She has been experiencing muscle spasms intermittently.  She took a dose of methocarbamol today to alleviate her symptoms.  She requested trigger point injections today.  She tolerated the procedures well.  Procedure notes were completed above.  Aftercare was discussed.  Trigger finger, right ring finger - She had 2 injections by Dr. Amedeo Plenty in the past per patient.  Patient reports that her last injection was about 2 years ago.  She has been experiencing a recurrence of tenderness and locking recently.  Patient requested to schedule an ultrasound-guided right ring trigger finger injection.  Greater trochanteric bursitis of both hips: She has good range of motion of both hip joints on examination today.  No groin pain.  She has tenderness palpation over bilateral trochanteric bursa.  Discussed the importance of performing stretching exercises daily.  Status post right knee replacement - Performed on 09/18/20 by Dr. Alvan Dame.  Doing well.  Good range of motion with no discomfort.  No warmth  or effusion noted.  Primary osteoarthritis of left knee: She has good range of motion of the left knee joint on examination today.  No warmth or effusion noted.  DDD (degenerative disc disease), lumbar - Status post fusion.  She is followed by Dr. Patrice Paradise.  Overall doing well.  No midline spinal tenderness.  No symptoms of radiculopathy at this time.  Other medical conditions are listed as follows:  History of hypertension: Blood pressure was 101/62 today in the office.  History of hypercholesterolemia  History of gastroesophageal reflux (GERD)  History of asthma  Orders: Orders Placed This Encounter  Procedures   Trigger Point Inj   Meds ordered this encounter  Medications   traZODone (DESYREL) 50 MG tablet    Sig: Take 1 tablet (50 mg total) by mouth at bedtime as needed. for sleep    Dispense:  30 tablet    Refill:  2     Follow-Up Instructions: Return in about 6 months (around 03/24/2023) for Fibromyalgia.   Ofilia Neas, PA-C  Note - This record has been created using Dragon software.  Chart creation errors have been sought, but may not always  have been located. Such creation errors do not reflect on  the standard of medical care.

## 2022-09-23 ENCOUNTER — Ambulatory Visit: Payer: Medicare Other | Attending: Physician Assistant | Admitting: Physician Assistant

## 2022-09-23 ENCOUNTER — Encounter: Payer: Self-pay | Admitting: Physician Assistant

## 2022-09-23 VITALS — BP 101/62 | HR 86 | Resp 15 | Ht 66.5 in | Wt 164.0 lb

## 2022-09-23 DIAGNOSIS — M797 Fibromyalgia: Secondary | ICD-10-CM | POA: Diagnosis not present

## 2022-09-23 DIAGNOSIS — R5382 Chronic fatigue, unspecified: Secondary | ICD-10-CM

## 2022-09-23 DIAGNOSIS — M7062 Trochanteric bursitis, left hip: Secondary | ICD-10-CM

## 2022-09-23 DIAGNOSIS — M62838 Other muscle spasm: Secondary | ICD-10-CM | POA: Diagnosis not present

## 2022-09-23 DIAGNOSIS — M65341 Trigger finger, right ring finger: Secondary | ICD-10-CM

## 2022-09-23 DIAGNOSIS — M1712 Unilateral primary osteoarthritis, left knee: Secondary | ICD-10-CM

## 2022-09-23 DIAGNOSIS — G4709 Other insomnia: Secondary | ICD-10-CM | POA: Diagnosis not present

## 2022-09-23 DIAGNOSIS — Z8719 Personal history of other diseases of the digestive system: Secondary | ICD-10-CM

## 2022-09-23 DIAGNOSIS — Z8709 Personal history of other diseases of the respiratory system: Secondary | ICD-10-CM

## 2022-09-23 DIAGNOSIS — J3081 Allergic rhinitis due to animal (cat) (dog) hair and dander: Secondary | ICD-10-CM | POA: Diagnosis not present

## 2022-09-23 DIAGNOSIS — Z8679 Personal history of other diseases of the circulatory system: Secondary | ICD-10-CM

## 2022-09-23 DIAGNOSIS — Z8639 Personal history of other endocrine, nutritional and metabolic disease: Secondary | ICD-10-CM

## 2022-09-23 DIAGNOSIS — J3089 Other allergic rhinitis: Secondary | ICD-10-CM | POA: Diagnosis not present

## 2022-09-23 DIAGNOSIS — J301 Allergic rhinitis due to pollen: Secondary | ICD-10-CM | POA: Diagnosis not present

## 2022-09-23 DIAGNOSIS — M5136 Other intervertebral disc degeneration, lumbar region: Secondary | ICD-10-CM

## 2022-09-23 DIAGNOSIS — Z96651 Presence of right artificial knee joint: Secondary | ICD-10-CM

## 2022-09-23 DIAGNOSIS — M7061 Trochanteric bursitis, right hip: Secondary | ICD-10-CM

## 2022-09-23 MED ORDER — LIDOCAINE HCL 1 % IJ SOLN
0.5000 mL | INTRAMUSCULAR | Status: AC | PRN
  Administered 2022-09-23: .5 mL

## 2022-09-23 MED ORDER — TRAZODONE HCL 50 MG PO TABS
50.0000 mg | ORAL_TABLET | Freq: Every evening | ORAL | 2 refills | Status: DC | PRN
Start: 2022-09-23 — End: 2022-12-29

## 2022-09-23 MED ORDER — TRIAMCINOLONE ACETONIDE 40 MG/ML IJ SUSP
10.0000 mg | INTRAMUSCULAR | Status: AC | PRN
  Administered 2022-09-23: 10 mg via INTRAMUSCULAR

## 2022-09-24 DIAGNOSIS — H33311 Horseshoe tear of retina without detachment, right eye: Secondary | ICD-10-CM | POA: Diagnosis not present

## 2022-09-24 DIAGNOSIS — H25813 Combined forms of age-related cataract, bilateral: Secondary | ICD-10-CM | POA: Diagnosis not present

## 2022-09-24 DIAGNOSIS — H43813 Vitreous degeneration, bilateral: Secondary | ICD-10-CM | POA: Diagnosis not present

## 2022-09-29 DIAGNOSIS — J3089 Other allergic rhinitis: Secondary | ICD-10-CM | POA: Diagnosis not present

## 2022-09-29 DIAGNOSIS — J301 Allergic rhinitis due to pollen: Secondary | ICD-10-CM | POA: Diagnosis not present

## 2022-09-29 DIAGNOSIS — J3081 Allergic rhinitis due to animal (cat) (dog) hair and dander: Secondary | ICD-10-CM | POA: Diagnosis not present

## 2022-10-07 DIAGNOSIS — J3089 Other allergic rhinitis: Secondary | ICD-10-CM | POA: Diagnosis not present

## 2022-10-07 DIAGNOSIS — J3081 Allergic rhinitis due to animal (cat) (dog) hair and dander: Secondary | ICD-10-CM | POA: Diagnosis not present

## 2022-10-07 DIAGNOSIS — J301 Allergic rhinitis due to pollen: Secondary | ICD-10-CM | POA: Diagnosis not present

## 2022-10-13 DIAGNOSIS — Z79899 Other long term (current) drug therapy: Secondary | ICD-10-CM | POA: Diagnosis not present

## 2022-10-13 DIAGNOSIS — M7061 Trochanteric bursitis, right hip: Secondary | ICD-10-CM | POA: Diagnosis not present

## 2022-10-13 DIAGNOSIS — M4326 Fusion of spine, lumbar region: Secondary | ICD-10-CM | POA: Diagnosis not present

## 2022-10-13 DIAGNOSIS — M7062 Trochanteric bursitis, left hip: Secondary | ICD-10-CM | POA: Diagnosis not present

## 2022-10-13 DIAGNOSIS — M5106 Intervertebral disc disorders with myelopathy, lumbar region: Secondary | ICD-10-CM | POA: Diagnosis not present

## 2022-10-17 DIAGNOSIS — J3081 Allergic rhinitis due to animal (cat) (dog) hair and dander: Secondary | ICD-10-CM | POA: Diagnosis not present

## 2022-10-17 DIAGNOSIS — J3089 Other allergic rhinitis: Secondary | ICD-10-CM | POA: Diagnosis not present

## 2022-10-17 DIAGNOSIS — J301 Allergic rhinitis due to pollen: Secondary | ICD-10-CM | POA: Diagnosis not present

## 2022-10-22 DIAGNOSIS — R7302 Impaired glucose tolerance (oral): Secondary | ICD-10-CM | POA: Diagnosis not present

## 2022-10-22 DIAGNOSIS — J4540 Moderate persistent asthma, uncomplicated: Secondary | ICD-10-CM | POA: Diagnosis not present

## 2022-10-23 DIAGNOSIS — J301 Allergic rhinitis due to pollen: Secondary | ICD-10-CM | POA: Diagnosis not present

## 2022-10-23 DIAGNOSIS — J3089 Other allergic rhinitis: Secondary | ICD-10-CM | POA: Diagnosis not present

## 2022-10-23 DIAGNOSIS — J3081 Allergic rhinitis due to animal (cat) (dog) hair and dander: Secondary | ICD-10-CM | POA: Diagnosis not present

## 2022-11-03 DIAGNOSIS — J301 Allergic rhinitis due to pollen: Secondary | ICD-10-CM | POA: Diagnosis not present

## 2022-11-03 DIAGNOSIS — J3089 Other allergic rhinitis: Secondary | ICD-10-CM | POA: Diagnosis not present

## 2022-11-03 DIAGNOSIS — J3081 Allergic rhinitis due to animal (cat) (dog) hair and dander: Secondary | ICD-10-CM | POA: Diagnosis not present

## 2022-11-21 DIAGNOSIS — B029 Zoster without complications: Secondary | ICD-10-CM | POA: Diagnosis not present

## 2022-11-21 DIAGNOSIS — H579 Unspecified disorder of eye and adnexa: Secondary | ICD-10-CM | POA: Diagnosis not present

## 2022-11-21 DIAGNOSIS — L739 Follicular disorder, unspecified: Secondary | ICD-10-CM | POA: Diagnosis not present

## 2022-12-01 DIAGNOSIS — J3081 Allergic rhinitis due to animal (cat) (dog) hair and dander: Secondary | ICD-10-CM | POA: Diagnosis not present

## 2022-12-01 DIAGNOSIS — J3089 Other allergic rhinitis: Secondary | ICD-10-CM | POA: Diagnosis not present

## 2022-12-01 DIAGNOSIS — J301 Allergic rhinitis due to pollen: Secondary | ICD-10-CM | POA: Diagnosis not present

## 2022-12-08 DIAGNOSIS — M545 Low back pain, unspecified: Secondary | ICD-10-CM | POA: Diagnosis not present

## 2022-12-08 DIAGNOSIS — M5106 Intervertebral disc disorders with myelopathy, lumbar region: Secondary | ICD-10-CM | POA: Diagnosis not present

## 2022-12-08 DIAGNOSIS — M4326 Fusion of spine, lumbar region: Secondary | ICD-10-CM | POA: Diagnosis not present

## 2022-12-15 DIAGNOSIS — J3089 Other allergic rhinitis: Secondary | ICD-10-CM | POA: Diagnosis not present

## 2022-12-15 DIAGNOSIS — J301 Allergic rhinitis due to pollen: Secondary | ICD-10-CM | POA: Diagnosis not present

## 2022-12-15 DIAGNOSIS — J3081 Allergic rhinitis due to animal (cat) (dog) hair and dander: Secondary | ICD-10-CM | POA: Diagnosis not present

## 2022-12-25 DIAGNOSIS — J301 Allergic rhinitis due to pollen: Secondary | ICD-10-CM | POA: Diagnosis not present

## 2022-12-25 DIAGNOSIS — J3081 Allergic rhinitis due to animal (cat) (dog) hair and dander: Secondary | ICD-10-CM | POA: Diagnosis not present

## 2022-12-25 DIAGNOSIS — J3089 Other allergic rhinitis: Secondary | ICD-10-CM | POA: Diagnosis not present

## 2022-12-29 ENCOUNTER — Other Ambulatory Visit: Payer: Self-pay | Admitting: *Deleted

## 2022-12-29 MED ORDER — TRAZODONE HCL 50 MG PO TABS: 50.0000 mg | ORAL_TABLET | Freq: Every evening | ORAL | 2 refills | Status: AC | PRN

## 2022-12-29 NOTE — Telephone Encounter (Signed)
Refill request received via fax from [facility] for Trazodone.  Last Fill: 09/23/2022  Next Visit: 01/22/2023  Last Visit: 09/23/2022  Dx: Other insomnia -   Current Dose per office note on 09/23/2022: trazodone 50 mg 1 tablet by mouth at bedtime for insomnia.   Okay to refill Trazodone?

## 2023-01-09 DIAGNOSIS — J3089 Other allergic rhinitis: Secondary | ICD-10-CM | POA: Diagnosis not present

## 2023-01-09 DIAGNOSIS — J301 Allergic rhinitis due to pollen: Secondary | ICD-10-CM | POA: Diagnosis not present

## 2023-01-09 DIAGNOSIS — J3081 Allergic rhinitis due to animal (cat) (dog) hair and dander: Secondary | ICD-10-CM | POA: Diagnosis not present

## 2023-01-21 DIAGNOSIS — J301 Allergic rhinitis due to pollen: Secondary | ICD-10-CM | POA: Diagnosis not present

## 2023-01-21 DIAGNOSIS — J3089 Other allergic rhinitis: Secondary | ICD-10-CM | POA: Diagnosis not present

## 2023-01-21 DIAGNOSIS — J3081 Allergic rhinitis due to animal (cat) (dog) hair and dander: Secondary | ICD-10-CM | POA: Diagnosis not present

## 2023-01-22 ENCOUNTER — Other Ambulatory Visit: Payer: Medicare Other | Admitting: Rheumatology

## 2023-01-23 DIAGNOSIS — M7741 Metatarsalgia, right foot: Secondary | ICD-10-CM | POA: Diagnosis not present

## 2023-01-23 DIAGNOSIS — M2041 Other hammer toe(s) (acquired), right foot: Secondary | ICD-10-CM | POA: Diagnosis not present

## 2023-02-05 DIAGNOSIS — J3089 Other allergic rhinitis: Secondary | ICD-10-CM | POA: Diagnosis not present

## 2023-02-05 DIAGNOSIS — J3081 Allergic rhinitis due to animal (cat) (dog) hair and dander: Secondary | ICD-10-CM | POA: Diagnosis not present

## 2023-02-05 DIAGNOSIS — J301 Allergic rhinitis due to pollen: Secondary | ICD-10-CM | POA: Diagnosis not present

## 2023-02-09 DIAGNOSIS — M5106 Intervertebral disc disorders with myelopathy, lumbar region: Secondary | ICD-10-CM | POA: Diagnosis not present

## 2023-02-09 DIAGNOSIS — M545 Low back pain, unspecified: Secondary | ICD-10-CM | POA: Diagnosis not present

## 2023-02-09 DIAGNOSIS — M4326 Fusion of spine, lumbar region: Secondary | ICD-10-CM | POA: Diagnosis not present

## 2023-02-10 DIAGNOSIS — J301 Allergic rhinitis due to pollen: Secondary | ICD-10-CM | POA: Diagnosis not present

## 2023-02-10 DIAGNOSIS — J3081 Allergic rhinitis due to animal (cat) (dog) hair and dander: Secondary | ICD-10-CM | POA: Diagnosis not present

## 2023-02-10 DIAGNOSIS — J3089 Other allergic rhinitis: Secondary | ICD-10-CM | POA: Diagnosis not present

## 2023-02-17 DIAGNOSIS — K080 Exfoliation of teeth due to systemic causes: Secondary | ICD-10-CM | POA: Diagnosis not present

## 2023-02-18 DIAGNOSIS — M810 Age-related osteoporosis without current pathological fracture: Secondary | ICD-10-CM | POA: Diagnosis not present

## 2023-02-18 DIAGNOSIS — Z01419 Encounter for gynecological examination (general) (routine) without abnormal findings: Secondary | ICD-10-CM | POA: Diagnosis not present

## 2023-02-18 DIAGNOSIS — Z1231 Encounter for screening mammogram for malignant neoplasm of breast: Secondary | ICD-10-CM | POA: Diagnosis not present

## 2023-02-18 DIAGNOSIS — Z6826 Body mass index (BMI) 26.0-26.9, adult: Secondary | ICD-10-CM | POA: Diagnosis not present

## 2023-02-20 DIAGNOSIS — J3081 Allergic rhinitis due to animal (cat) (dog) hair and dander: Secondary | ICD-10-CM | POA: Diagnosis not present

## 2023-02-20 DIAGNOSIS — J3089 Other allergic rhinitis: Secondary | ICD-10-CM | POA: Diagnosis not present

## 2023-02-20 DIAGNOSIS — J301 Allergic rhinitis due to pollen: Secondary | ICD-10-CM | POA: Diagnosis not present

## 2023-02-27 DIAGNOSIS — J3081 Allergic rhinitis due to animal (cat) (dog) hair and dander: Secondary | ICD-10-CM | POA: Diagnosis not present

## 2023-02-27 DIAGNOSIS — J301 Allergic rhinitis due to pollen: Secondary | ICD-10-CM | POA: Diagnosis not present

## 2023-02-27 DIAGNOSIS — J3089 Other allergic rhinitis: Secondary | ICD-10-CM | POA: Diagnosis not present

## 2023-03-04 DIAGNOSIS — M65351 Trigger finger, right little finger: Secondary | ICD-10-CM | POA: Diagnosis not present

## 2023-03-04 DIAGNOSIS — M65341 Trigger finger, right ring finger: Secondary | ICD-10-CM | POA: Diagnosis not present

## 2023-03-10 NOTE — Progress Notes (Deleted)
Office Visit Note  Patient: [name]             Date of Birth: [birth year]           MRN: [id number]             PCP: Lewis Moccasin, MD Referring: Lewis Moccasin, MD Visit Date: 03/24/2023 Occupation: @GUAROCC @  Subjective:  No chief complaint on file.   History of Present Illness: [name] is a 67 y.o. female ***with fibromyalgia, osteoarthritis and degenerative disc disease.  She returns today after her last visit on Feb 11, 2022.  She had right ring trigger finger injections x 3 by Dr. Amanda Pea.  She also had trochanteric bursa injections in May 2023.  Right knee replacement done by Dr. Charlann Boxer in December 2021 continues to do well.  She continues to have some lower back pain.  She had fusion in the past and is followed by Dr. Noel Gerold.    Activities of Daily Living:  Patient reports morning stiffness for *** {minute/hour:19697}.   Patient {ACTIONS;DENIES/REPORTS:21021675::"Denies"} nocturnal pain.  Difficulty dressing/grooming: {ACTIONS;DENIES/REPORTS:21021675::"Denies"} Difficulty climbing stairs: {ACTIONS;DENIES/REPORTS:21021675::"Denies"} Difficulty getting out of chair: {ACTIONS;DENIES/REPORTS:21021675::"Denies"} Difficulty using hands for taps, buttons, cutlery, and/or writing: {ACTIONS;DENIES/REPORTS:21021675::"Denies"}  No Rheumatology ROS completed.   PMFS History:  Patient Active Problem List   Diagnosis Date Noted   S/P total knee arthroplasty, right 09/18/2020   Benign hypertension 08/02/2018   Chronic back pain 08/02/2018   Diabetes mellitus ([facility]) 08/02/2018   Hyperlipidemia 08/02/2018   Plantar fasciitis 07/11/2018   Acquired trigger finger 03/17/2018   Primary osteoarthritis of both knees 11/16/2017   DDD (degenerative disc disease), lumbar 11/16/2017   History of asthma 11/16/2017   History of gastroesophageal reflux (GERD) 11/16/2017   History of hypertension 11/16/2017   History of neuropathy 11/16/2017   History of  hypercholesterolemia 11/16/2017   Mass of hand 11/09/2017   Pain in right hand 11/09/2017   Trigger finger 11/09/2017   Fibromyalgia 07/26/2016   Greater trochanteric bursitis of both hips 07/26/2016   Chronic fatigue 07/26/2016   Other insomnia 07/26/2016   Rectal bleeding 01/31/2014    Past Medical History:  Diagnosis Date   Arthritis    RIGHT KNEE   Asthma    Chronic back pain    DDD (degenerative disc disease), lumbar    Diabetes mellitus type 2, diet-controlled ([facility])    pt denies    Fibromyalgia    GERD (gastroesophageal reflux disease)    Hemorrhoids    INTERNAL AND EXTERNAL   History of colon polyps    2009-- BENIGN   Hyperlipidemia    Hypertension    PONV (postoperative nausea and vomiting)    Right knee meniscal tear     Family History  Problem Relation Age of Onset   Heart disease Mother    Heart attack Mother    Arthritis Father    Diabetes Sister    Diabetes Brother    Hypertension Brother    Stomach cancer Brother    Past Surgical History:  Procedure Laterality Date   CARDIOVASCULAR STRESS TEST  07-06-2013   no ischemia or infarct/  normal LV function and wall motion , ef 63%   COLONOSCOPY W/ POLYPECTOMY  01-10-2008   KNEE ARTHROSCOPY Right 10-14-2006   KNEE ARTHROSCOPY WITH LATERAL MENISECTOMY Right 05/18/2015   Procedure: KNEE ARTHROSCOPY WITH PARTIAL  LATERAL MENISECTOMY;  Surgeon: Durene Romans, MD;  Location: [facility];  Service: Orthopedics;  Laterality: Right;   KNEE  ARTHROSCOPY WITH MEDIAL MENISECTOMY Right 05/18/2015   Procedure: KNEE ARTHROSCOPY WITH PARTIAL  MEDIAL MENISECTOMY;  Surgeon: Durene Romans, MD;  Location: [facility];  Service: Orthopedics;  Laterality: Right;   LIPOMA EXCISION Right 01/2018   right thumb lipoma    PULLEY RELEASE RIGHT LONG AND SMALL FINGERS  01-30-2006   RECONSTRUCTION OF ELBOW Bilateral right 06-03-2005  &  left  06-12-2011   ROBOTIC ASSITED PARTIAL NEPHRECTOMY Left 10-16-2008    oncocytoma ( negative neoplasm)   SHOULDER ARTHROSCOPY W/ SUBACROMIAL DECOMPRESSION AND DISTAL CLAVICLE EXCISION Left 07-07-2007   and ROTATOR CUFF REPAIR   SIGMOIDOSCOPY  02-02-2014   TOTAL ABDOMINAL HYSTERECTOMY W/ BILATERAL SALPINGOOPHORECTOMY  1979   TOTAL KNEE ARTHROPLASTY Right 09/18/2020   Procedure: TOTAL KNEE ARTHROPLASTY;  Surgeon: Durene Romans, MD;  Location: [facility];  Service: Orthopedics;  Laterality: Right;  70 mins   Social History   Social History Narrative   Not on file   Immunization History  Administered Date(s) Administered   Influenza,inj,Quad PF,6+ Mos 06/30/2018   Moderna Sars-Covid-2 Vaccination 11/05/2019, 12/04/2019, 08/22/2020, 02/06/2021, 07/26/2021     Objective: Vital Signs: There were no vitals taken for this visit.   Physical Exam   Musculoskeletal Exam: ***  CDAI Exam: CDAI Score: -- Patient Global: --; Provider Global: -- Swollen: --; Tender: -- Joint Exam 03/24/2023   No joint exam has been documented for this visit   There is currently no information documented on the homunculus. Go to the Rheumatology activity and complete the homunculus joint exam.  Investigation: No additional findings.  Imaging: No results found.  Recent Labs: Lab Results  Component Value Date   WBC 12.2 (H) 09/19/2020   HGB 10.7 (L) 09/19/2020   PLT 175 09/19/2020   NA 137 09/19/2020   K 3.9 09/19/2020   CL 104 09/19/2020   CO2 25 09/19/2020   GLUCOSE 130 (H) 09/19/2020   BUN 19 09/19/2020   CREATININE 0.97 09/19/2020   BILITOT 0.3 06/28/2013   ALKPHOS 68 06/28/2013   AST 14 06/28/2013   ALT 11 06/28/2013   PROT 7.6 06/28/2013   ALBUMIN 3.8 06/28/2013   CALCIUM 8.9 09/19/2020   GFRAA >60 05/08/2015    Speciality Comments: No specialty comments available.  Procedures:  No procedures performed Allergies: Dilaudid [hydromorphone], Levofloxacin, Macrobid [nitrofurantoin macrocrystal], Oxycodone-acetaminophen, Penicillin g benzathine, Seasonal ic  [cholestatin], Cyclobenzaprine, Penicillins, Percocet [oxycodone-acetaminophen], and Zanaflex [tizanidine hcl]   Assessment / Plan:     Visit Diagnoses: No diagnosis found.  Orders: No orders of the defined types were placed in this encounter.  No orders of the defined types were placed in this encounter.   Face-to-face time spent with patient was *** minutes. Greater than 50% of time was spent in counseling and coordination of care.  Follow-Up Instructions: No follow-ups on file.   Ellen Henri, CMA  Note - This record has been created using Animal nutritionist.  Chart creation errors have been sought, but may not always  have been located. Such creation errors do not reflect on  the standard of medical care.

## 2023-03-12 DIAGNOSIS — J3081 Allergic rhinitis due to animal (cat) (dog) hair and dander: Secondary | ICD-10-CM | POA: Diagnosis not present

## 2023-03-12 DIAGNOSIS — J3089 Other allergic rhinitis: Secondary | ICD-10-CM | POA: Diagnosis not present

## 2023-03-12 DIAGNOSIS — J301 Allergic rhinitis due to pollen: Secondary | ICD-10-CM | POA: Diagnosis not present

## 2023-03-20 DIAGNOSIS — H1045 Other chronic allergic conjunctivitis: Secondary | ICD-10-CM | POA: Diagnosis not present

## 2023-03-20 DIAGNOSIS — J3089 Other allergic rhinitis: Secondary | ICD-10-CM | POA: Diagnosis not present

## 2023-03-20 DIAGNOSIS — J4530 Mild persistent asthma, uncomplicated: Secondary | ICD-10-CM | POA: Diagnosis not present

## 2023-03-20 DIAGNOSIS — J301 Allergic rhinitis due to pollen: Secondary | ICD-10-CM | POA: Diagnosis not present

## 2023-03-20 DIAGNOSIS — J3081 Allergic rhinitis due to animal (cat) (dog) hair and dander: Secondary | ICD-10-CM | POA: Diagnosis not present

## 2023-03-23 DIAGNOSIS — R7302 Impaired glucose tolerance (oral): Secondary | ICD-10-CM | POA: Diagnosis not present

## 2023-03-23 DIAGNOSIS — I10 Essential (primary) hypertension: Secondary | ICD-10-CM | POA: Diagnosis not present

## 2023-03-23 DIAGNOSIS — E7800 Pure hypercholesterolemia, unspecified: Secondary | ICD-10-CM | POA: Diagnosis not present

## 2023-03-24 ENCOUNTER — Ambulatory Visit: Payer: Medicare Other | Admitting: Rheumatology

## 2023-03-24 DIAGNOSIS — M7061 Trochanteric bursitis, right hip: Secondary | ICD-10-CM

## 2023-03-24 DIAGNOSIS — M62838 Other muscle spasm: Secondary | ICD-10-CM

## 2023-03-24 DIAGNOSIS — Z8719 Personal history of other diseases of the digestive system: Secondary | ICD-10-CM

## 2023-03-24 DIAGNOSIS — Z8639 Personal history of other endocrine, nutritional and metabolic disease: Secondary | ICD-10-CM

## 2023-03-24 DIAGNOSIS — M1712 Unilateral primary osteoarthritis, left knee: Secondary | ICD-10-CM

## 2023-03-24 DIAGNOSIS — Z8679 Personal history of other diseases of the circulatory system: Secondary | ICD-10-CM

## 2023-03-24 DIAGNOSIS — G4709 Other insomnia: Secondary | ICD-10-CM

## 2023-03-24 DIAGNOSIS — M65341 Trigger finger, right ring finger: Secondary | ICD-10-CM

## 2023-03-24 DIAGNOSIS — Z8709 Personal history of other diseases of the respiratory system: Secondary | ICD-10-CM

## 2023-03-24 DIAGNOSIS — M797 Fibromyalgia: Secondary | ICD-10-CM

## 2023-03-24 DIAGNOSIS — R5382 Chronic fatigue, unspecified: Secondary | ICD-10-CM

## 2023-03-24 DIAGNOSIS — Z96651 Presence of right artificial knee joint: Secondary | ICD-10-CM

## 2023-03-24 DIAGNOSIS — M5136 Other intervertebral disc degeneration, lumbar region: Secondary | ICD-10-CM

## 2023-04-08 DIAGNOSIS — J3081 Allergic rhinitis due to animal (cat) (dog) hair and dander: Secondary | ICD-10-CM | POA: Diagnosis not present

## 2023-04-08 DIAGNOSIS — J301 Allergic rhinitis due to pollen: Secondary | ICD-10-CM | POA: Diagnosis not present

## 2023-04-08 DIAGNOSIS — J3089 Other allergic rhinitis: Secondary | ICD-10-CM | POA: Diagnosis not present

## 2023-04-13 DIAGNOSIS — M4326 Fusion of spine, lumbar region: Secondary | ICD-10-CM | POA: Diagnosis not present

## 2023-04-13 DIAGNOSIS — M5106 Intervertebral disc disorders with myelopathy, lumbar region: Secondary | ICD-10-CM | POA: Diagnosis not present

## 2023-04-13 DIAGNOSIS — M545 Low back pain, unspecified: Secondary | ICD-10-CM | POA: Diagnosis not present

## 2023-04-22 DIAGNOSIS — M7741 Metatarsalgia, right foot: Secondary | ICD-10-CM | POA: Diagnosis not present

## 2023-04-22 DIAGNOSIS — J3089 Other allergic rhinitis: Secondary | ICD-10-CM | POA: Diagnosis not present

## 2023-04-22 DIAGNOSIS — J3081 Allergic rhinitis due to animal (cat) (dog) hair and dander: Secondary | ICD-10-CM | POA: Diagnosis not present

## 2023-04-22 DIAGNOSIS — J301 Allergic rhinitis due to pollen: Secondary | ICD-10-CM | POA: Diagnosis not present

## 2023-05-11 DIAGNOSIS — J3089 Other allergic rhinitis: Secondary | ICD-10-CM | POA: Diagnosis not present

## 2023-05-11 DIAGNOSIS — J301 Allergic rhinitis due to pollen: Secondary | ICD-10-CM | POA: Diagnosis not present

## 2023-05-11 DIAGNOSIS — H524 Presbyopia: Secondary | ICD-10-CM | POA: Diagnosis not present

## 2023-05-11 DIAGNOSIS — J3081 Allergic rhinitis due to animal (cat) (dog) hair and dander: Secondary | ICD-10-CM | POA: Diagnosis not present

## 2023-05-19 DIAGNOSIS — J301 Allergic rhinitis due to pollen: Secondary | ICD-10-CM | POA: Diagnosis not present

## 2023-05-19 DIAGNOSIS — J3089 Other allergic rhinitis: Secondary | ICD-10-CM | POA: Diagnosis not present

## 2023-05-19 DIAGNOSIS — J3081 Allergic rhinitis due to animal (cat) (dog) hair and dander: Secondary | ICD-10-CM | POA: Diagnosis not present

## 2023-05-29 DIAGNOSIS — L821 Other seborrheic keratosis: Secondary | ICD-10-CM | POA: Diagnosis not present

## 2023-05-29 DIAGNOSIS — I781 Nevus, non-neoplastic: Secondary | ICD-10-CM | POA: Diagnosis not present

## 2023-06-03 DIAGNOSIS — J3081 Allergic rhinitis due to animal (cat) (dog) hair and dander: Secondary | ICD-10-CM | POA: Diagnosis not present

## 2023-06-03 DIAGNOSIS — J301 Allergic rhinitis due to pollen: Secondary | ICD-10-CM | POA: Diagnosis not present

## 2023-06-03 DIAGNOSIS — J3089 Other allergic rhinitis: Secondary | ICD-10-CM | POA: Diagnosis not present

## 2023-06-15 DIAGNOSIS — J301 Allergic rhinitis due to pollen: Secondary | ICD-10-CM | POA: Diagnosis not present

## 2023-06-15 DIAGNOSIS — Z6826 Body mass index (BMI) 26.0-26.9, adult: Secondary | ICD-10-CM | POA: Diagnosis not present

## 2023-06-15 DIAGNOSIS — M5106 Intervertebral disc disorders with myelopathy, lumbar region: Secondary | ICD-10-CM | POA: Diagnosis not present

## 2023-06-15 DIAGNOSIS — J3081 Allergic rhinitis due to animal (cat) (dog) hair and dander: Secondary | ICD-10-CM | POA: Diagnosis not present

## 2023-06-15 DIAGNOSIS — M545 Low back pain, unspecified: Secondary | ICD-10-CM | POA: Diagnosis not present

## 2023-06-15 DIAGNOSIS — M4326 Fusion of spine, lumbar region: Secondary | ICD-10-CM | POA: Diagnosis not present

## 2023-06-15 DIAGNOSIS — J3089 Other allergic rhinitis: Secondary | ICD-10-CM | POA: Diagnosis not present

## 2023-06-15 DIAGNOSIS — Z79899 Other long term (current) drug therapy: Secondary | ICD-10-CM | POA: Diagnosis not present

## 2023-06-25 DIAGNOSIS — J3089 Other allergic rhinitis: Secondary | ICD-10-CM | POA: Diagnosis not present

## 2023-06-25 DIAGNOSIS — J301 Allergic rhinitis due to pollen: Secondary | ICD-10-CM | POA: Diagnosis not present

## 2023-06-25 DIAGNOSIS — J3081 Allergic rhinitis due to animal (cat) (dog) hair and dander: Secondary | ICD-10-CM | POA: Diagnosis not present

## 2023-06-30 DIAGNOSIS — J3089 Other allergic rhinitis: Secondary | ICD-10-CM | POA: Diagnosis not present

## 2023-06-30 DIAGNOSIS — J301 Allergic rhinitis due to pollen: Secondary | ICD-10-CM | POA: Diagnosis not present

## 2023-06-30 DIAGNOSIS — J3081 Allergic rhinitis due to animal (cat) (dog) hair and dander: Secondary | ICD-10-CM | POA: Diagnosis not present

## 2023-07-04 DIAGNOSIS — Z23 Encounter for immunization: Secondary | ICD-10-CM | POA: Diagnosis not present

## 2023-07-07 DIAGNOSIS — Z09 Encounter for follow-up examination after completed treatment for conditions other than malignant neoplasm: Secondary | ICD-10-CM | POA: Diagnosis not present

## 2023-07-07 DIAGNOSIS — Z800 Family history of malignant neoplasm of digestive organs: Secondary | ICD-10-CM | POA: Diagnosis not present

## 2023-07-07 DIAGNOSIS — Z860101 Personal history of adenomatous and serrated colon polyps: Secondary | ICD-10-CM | POA: Diagnosis not present

## 2023-07-07 DIAGNOSIS — Z860100 Personal history of colon polyps, unspecified: Secondary | ICD-10-CM | POA: Diagnosis not present

## 2023-07-10 DIAGNOSIS — J301 Allergic rhinitis due to pollen: Secondary | ICD-10-CM | POA: Diagnosis not present

## 2023-07-10 DIAGNOSIS — J3089 Other allergic rhinitis: Secondary | ICD-10-CM | POA: Diagnosis not present

## 2023-07-10 DIAGNOSIS — J3081 Allergic rhinitis due to animal (cat) (dog) hair and dander: Secondary | ICD-10-CM | POA: Diagnosis not present

## 2023-07-17 DIAGNOSIS — J301 Allergic rhinitis due to pollen: Secondary | ICD-10-CM | POA: Diagnosis not present

## 2023-07-17 DIAGNOSIS — J3089 Other allergic rhinitis: Secondary | ICD-10-CM | POA: Diagnosis not present

## 2023-07-17 DIAGNOSIS — J3081 Allergic rhinitis due to animal (cat) (dog) hair and dander: Secondary | ICD-10-CM | POA: Diagnosis not present

## 2023-07-19 IMAGING — MR MR LUMBAR SPINE WO/W CM
4 of 7 series · 24 of 48 positions shown · IV contrast (multihance)
Comparison: Lumbar CT myelogram 01/12/2013

CLINICAL DATA: Intervertebral lumbar disc disorder with myelopathy,
lumbar region. Low back pain radiating to the left buttocks and down
the left leg for 6 months. Prior lumbar fusion.

EXAM:
MRI LUMBAR SPINE WITHOUT AND WITH CONTRAST
TECHNIQUE: Multiplanar and multiecho pulse sequences of the lumbar spine were
obtained without and with intravenous contrast.
CONTRAST:  15mL MULTIHANCE GADOBENATE DIMEGLUMINE 529 MG/ML IV SOLN

[Series 3: T1 · sagittal · 4.0mm · 1.09mm/px · 5 of 17 slices shown (1 of 2)]
[im 1/17]
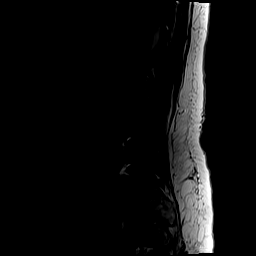
[im 5/17]
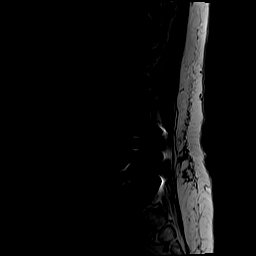
[im 9/17]
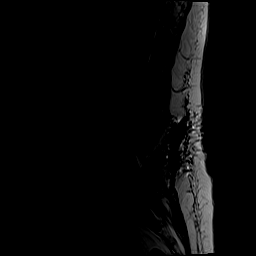
[im 13/17]
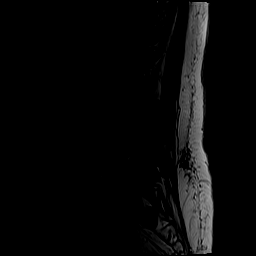
[im 17/17]
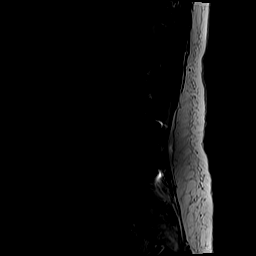

[Series 5: T2 · axial · 4.0mm · 0.39mm/px · z∈[+44,+245]mm · 8 of 43 slices shown]
[im 1/43]
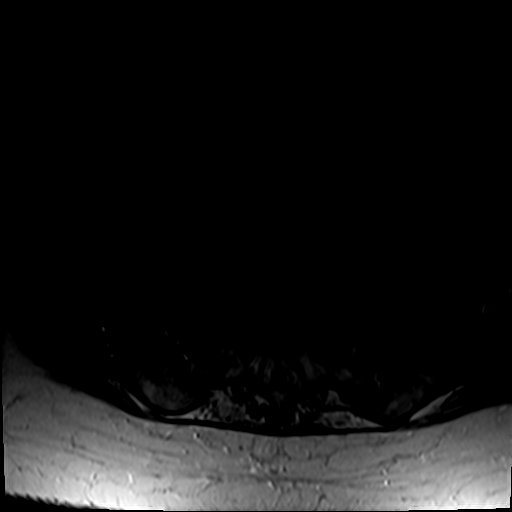
[im 5/43]
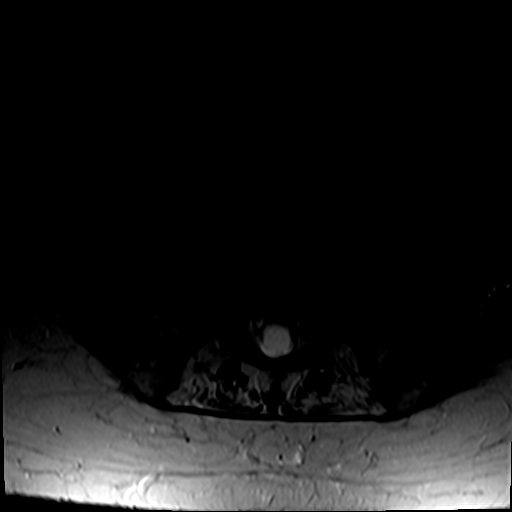
[im 15/43]
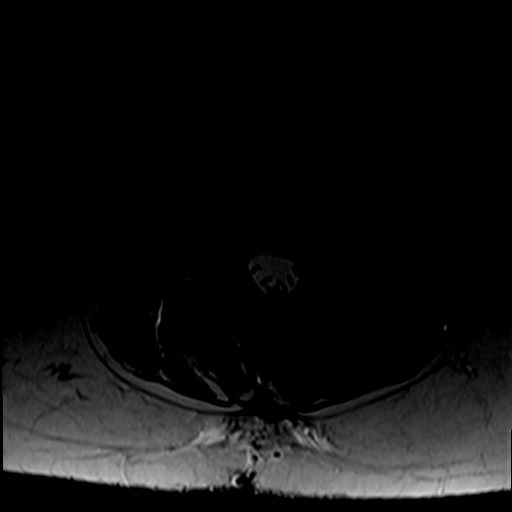
[im 19/43]
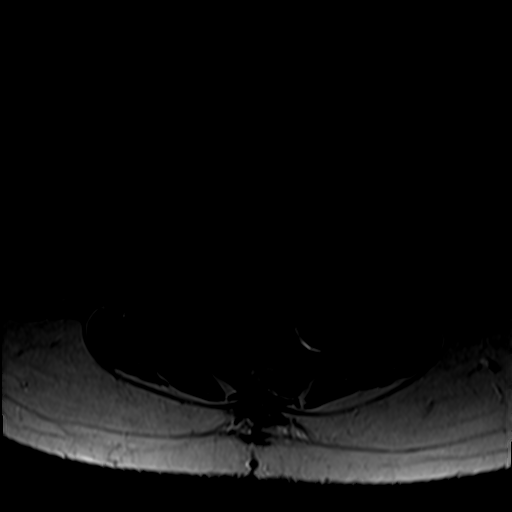
[im 24/43]
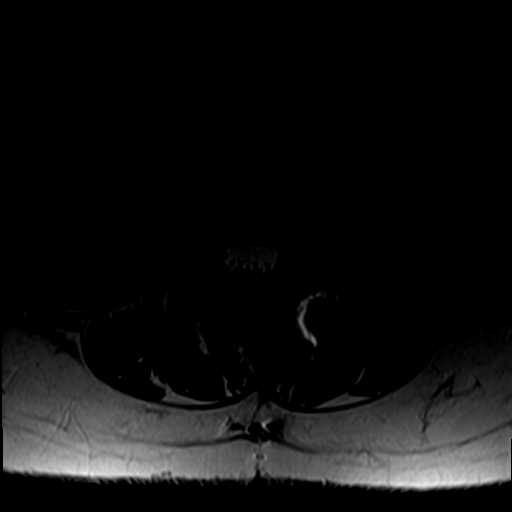
[im 29/43]
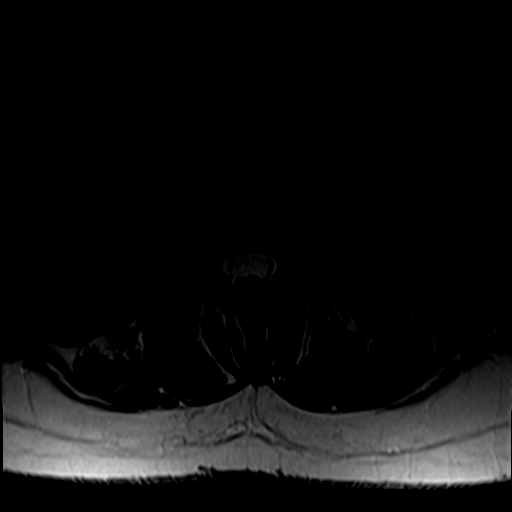
[im 38/43]
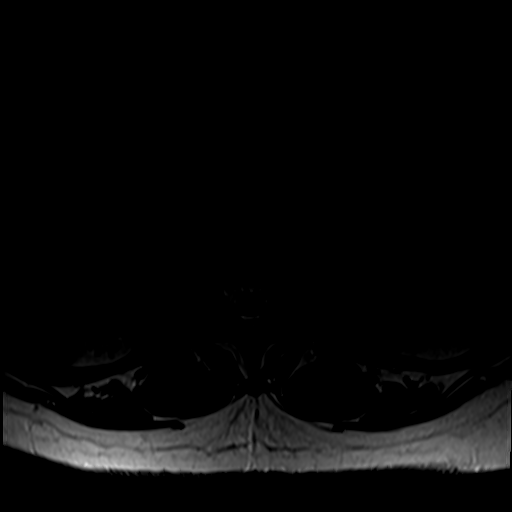
[im 43/43]
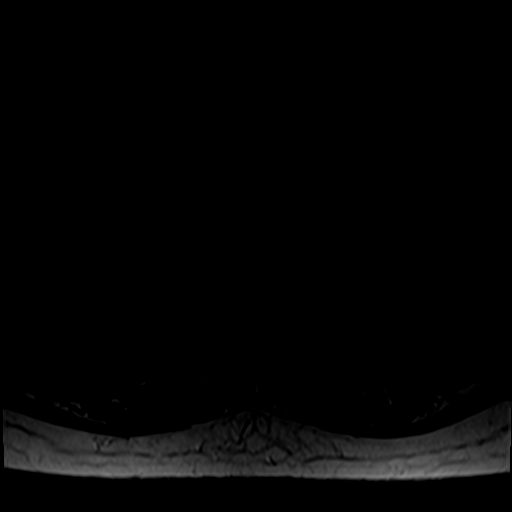

[Series 6: T1 · axial · 4.0mm · 0.39mm/px · z∈[+44,+221]mm · 7 of 43 slices shown (2 of 2)]
[im 1/43]
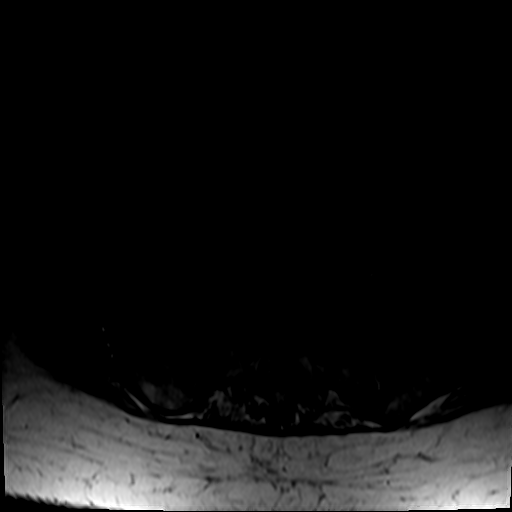
[im 5/43]
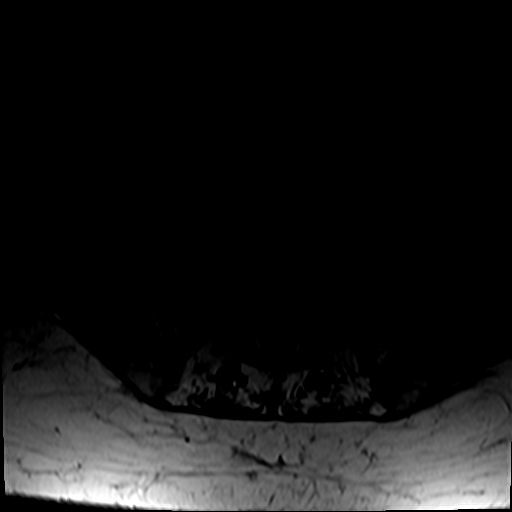
[im 15/43]
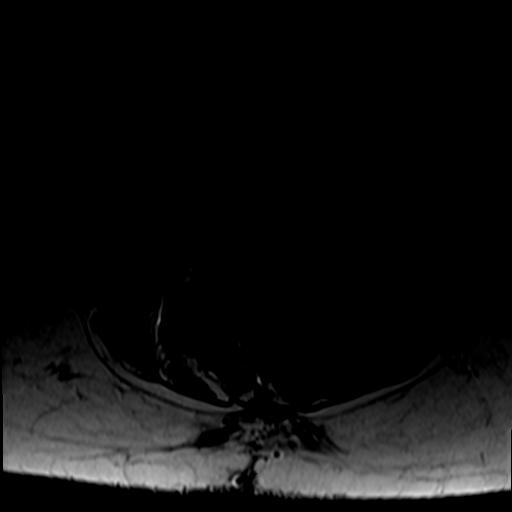
[im 19/43]
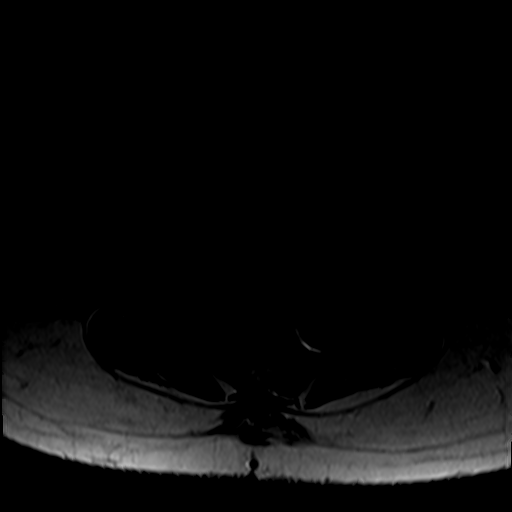
[im 24/43]
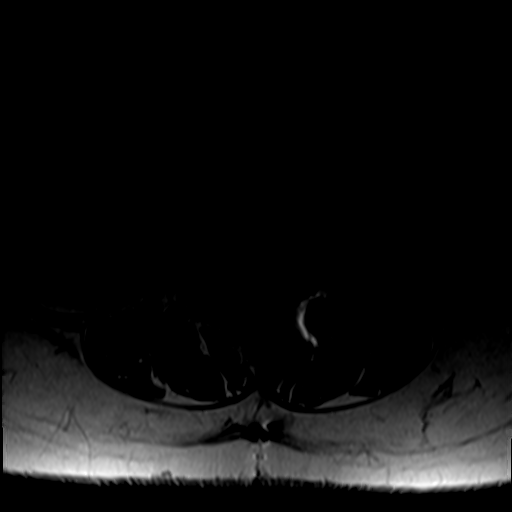
[im 29/43]
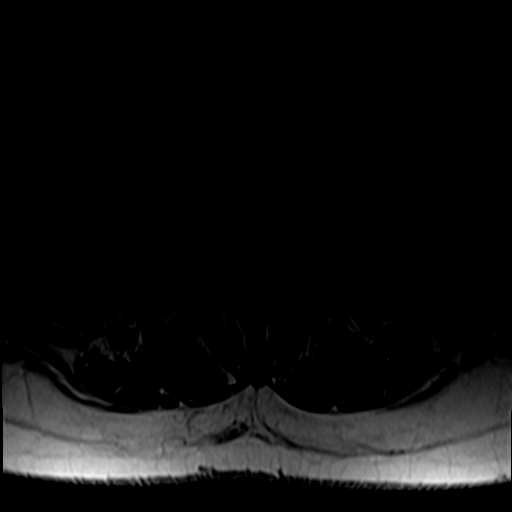
[im 38/43]
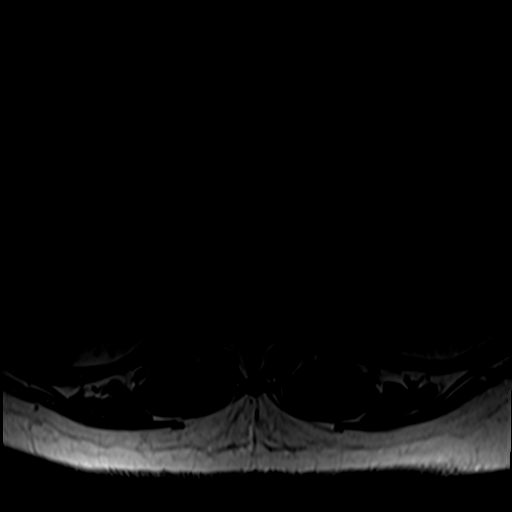

[Series 7: T2 post-contrast · sagittal · 4.0mm · 1.09mm/px · 4 of 17 slices shown]
[im 1/17]
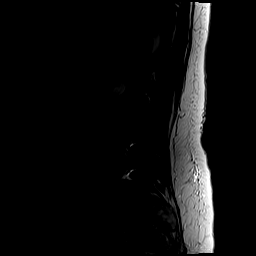
[im 6/17]
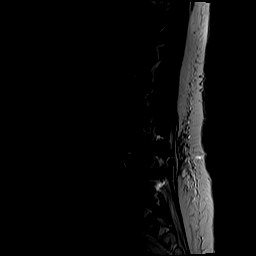
[im 11/17]
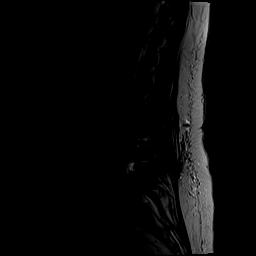
[im 17/17]
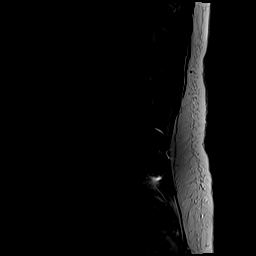

[24 of 48 positions shown; findings below may reference images not displayed]

FINDINGS: Segmentation:  Standard.

Alignment: Unchanged trace anterolisthesis of L4 on L5. Mild lumbar
dextroscoliosis.

Vertebrae: No fracture or suspicious marrow lesion. Edema in the
left pedicle and pars of T12, possibly a stress reaction. Interval
L4-5 posterior and interbody fusion.

Conus medullaris and cauda equina: Conus extends to the upper L2
level. Conus and cauda equina appear normal.

Paraspinal and other soft tissues: Postoperative changes in the
posterior lumbar soft tissues. No fluid collection.

Disc levels:

T11-12: Disc desiccation. Mild facet arthrosis without significant
stenosis.

T12-L1 and L1-2: Negative.

L2-3: Mild disc desiccation. New minimal disc bulging and mild facet
and ligamentum flavum hypertrophy without significant stenosis.

L3-4: Disc desiccation and progressive, moderate to severe disc
space narrowing. Progressive circumferential disc bulging, a new
large left subarticular and foraminal disc extrusion, endplate
spurring, and moderate facet and ligamentum flavum hypertrophy
result in new moderate spinal stenosis, mild right and severe left
lateral recess stenosis, and severe left neural foraminal stenosis.
Potential left L3 and L4 nerve root impingement.

L4-5: Interval posterior decompression and fusion.  No stenosis.

L5-S1: Disc desiccation. Mild disc bulging, a new small left
paracentral to subarticular disc protrusion, and mild facet
hypertrophy result in borderline left lateral recess and borderline
left neural foraminal stenosis without spinal stenosis.
IMPRESSION: 1. Interval L4-5 fusion without stenosis.
2. Advanced adjacent segment disease at L3-4 with a new large disc
extrusion resulting in severe left lateral recess and left neural
foraminal stenosis.
3. New small left paracentral/subarticular disc protrusion at L5-S1
without significant stenosis.
4. Edema in the left pedicle and pars of T12, possibly a stress
reaction.

## 2023-07-24 DIAGNOSIS — J3089 Other allergic rhinitis: Secondary | ICD-10-CM | POA: Diagnosis not present

## 2023-07-24 DIAGNOSIS — J301 Allergic rhinitis due to pollen: Secondary | ICD-10-CM | POA: Diagnosis not present

## 2023-07-24 DIAGNOSIS — J3081 Allergic rhinitis due to animal (cat) (dog) hair and dander: Secondary | ICD-10-CM | POA: Diagnosis not present

## 2023-07-27 DIAGNOSIS — M5106 Intervertebral disc disorders with myelopathy, lumbar region: Secondary | ICD-10-CM | POA: Diagnosis not present

## 2023-07-27 DIAGNOSIS — M545 Low back pain, unspecified: Secondary | ICD-10-CM | POA: Diagnosis not present

## 2023-07-27 DIAGNOSIS — M4326 Fusion of spine, lumbar region: Secondary | ICD-10-CM | POA: Diagnosis not present

## 2023-07-27 DIAGNOSIS — M791 Myalgia, unspecified site: Secondary | ICD-10-CM | POA: Diagnosis not present

## 2023-07-27 DIAGNOSIS — G894 Chronic pain syndrome: Secondary | ICD-10-CM | POA: Diagnosis not present

## 2023-07-27 DIAGNOSIS — M542 Cervicalgia: Secondary | ICD-10-CM | POA: Diagnosis not present

## 2023-07-29 DIAGNOSIS — J3089 Other allergic rhinitis: Secondary | ICD-10-CM | POA: Diagnosis not present

## 2023-07-29 DIAGNOSIS — J301 Allergic rhinitis due to pollen: Secondary | ICD-10-CM | POA: Diagnosis not present

## 2023-07-29 DIAGNOSIS — J3081 Allergic rhinitis due to animal (cat) (dog) hair and dander: Secondary | ICD-10-CM | POA: Diagnosis not present

## 2023-08-04 DIAGNOSIS — J301 Allergic rhinitis due to pollen: Secondary | ICD-10-CM | POA: Diagnosis not present

## 2023-08-04 DIAGNOSIS — J3081 Allergic rhinitis due to animal (cat) (dog) hair and dander: Secondary | ICD-10-CM | POA: Diagnosis not present

## 2023-08-04 DIAGNOSIS — J3089 Other allergic rhinitis: Secondary | ICD-10-CM | POA: Diagnosis not present

## 2023-08-18 DIAGNOSIS — J3081 Allergic rhinitis due to animal (cat) (dog) hair and dander: Secondary | ICD-10-CM | POA: Diagnosis not present

## 2023-08-18 DIAGNOSIS — J3089 Other allergic rhinitis: Secondary | ICD-10-CM | POA: Diagnosis not present

## 2023-08-18 DIAGNOSIS — J301 Allergic rhinitis due to pollen: Secondary | ICD-10-CM | POA: Diagnosis not present

## 2023-08-27 DIAGNOSIS — J3081 Allergic rhinitis due to animal (cat) (dog) hair and dander: Secondary | ICD-10-CM | POA: Diagnosis not present

## 2023-08-27 DIAGNOSIS — J301 Allergic rhinitis due to pollen: Secondary | ICD-10-CM | POA: Diagnosis not present

## 2023-08-27 DIAGNOSIS — J3089 Other allergic rhinitis: Secondary | ICD-10-CM | POA: Diagnosis not present

## 2023-09-04 DIAGNOSIS — J3089 Other allergic rhinitis: Secondary | ICD-10-CM | POA: Diagnosis not present

## 2023-09-04 DIAGNOSIS — J301 Allergic rhinitis due to pollen: Secondary | ICD-10-CM | POA: Diagnosis not present

## 2023-09-04 DIAGNOSIS — J3081 Allergic rhinitis due to animal (cat) (dog) hair and dander: Secondary | ICD-10-CM | POA: Diagnosis not present

## 2023-09-22 DIAGNOSIS — Z1212 Encounter for screening for malignant neoplasm of rectum: Secondary | ICD-10-CM | POA: Diagnosis not present

## 2023-09-22 DIAGNOSIS — R5382 Chronic fatigue, unspecified: Secondary | ICD-10-CM | POA: Diagnosis not present

## 2023-09-22 DIAGNOSIS — R7302 Impaired glucose tolerance (oral): Secondary | ICD-10-CM | POA: Diagnosis not present

## 2023-09-22 DIAGNOSIS — E7800 Pure hypercholesterolemia, unspecified: Secondary | ICD-10-CM | POA: Diagnosis not present

## 2023-09-22 DIAGNOSIS — M810 Age-related osteoporosis without current pathological fracture: Secondary | ICD-10-CM | POA: Diagnosis not present

## 2023-09-25 DIAGNOSIS — J301 Allergic rhinitis due to pollen: Secondary | ICD-10-CM | POA: Diagnosis not present

## 2023-09-25 DIAGNOSIS — J3089 Other allergic rhinitis: Secondary | ICD-10-CM | POA: Diagnosis not present

## 2023-09-25 DIAGNOSIS — J3081 Allergic rhinitis due to animal (cat) (dog) hair and dander: Secondary | ICD-10-CM | POA: Diagnosis not present

## 2023-09-28 DIAGNOSIS — G894 Chronic pain syndrome: Secondary | ICD-10-CM | POA: Diagnosis not present

## 2023-09-28 DIAGNOSIS — M791 Myalgia, unspecified site: Secondary | ICD-10-CM | POA: Diagnosis not present

## 2023-09-28 DIAGNOSIS — M545 Low back pain, unspecified: Secondary | ICD-10-CM | POA: Diagnosis not present

## 2023-09-28 DIAGNOSIS — M5106 Intervertebral disc disorders with myelopathy, lumbar region: Secondary | ICD-10-CM | POA: Diagnosis not present

## 2023-09-29 DIAGNOSIS — Z1339 Encounter for screening examination for other mental health and behavioral disorders: Secondary | ICD-10-CM | POA: Diagnosis not present

## 2023-09-29 DIAGNOSIS — Z1331 Encounter for screening for depression: Secondary | ICD-10-CM | POA: Diagnosis not present

## 2023-09-29 DIAGNOSIS — Z0000 Encounter for general adult medical examination without abnormal findings: Secondary | ICD-10-CM | POA: Diagnosis not present

## 2023-09-29 DIAGNOSIS — M85852 Other specified disorders of bone density and structure, left thigh: Secondary | ICD-10-CM | POA: Diagnosis not present

## 2023-09-29 DIAGNOSIS — R82998 Other abnormal findings in urine: Secondary | ICD-10-CM | POA: Diagnosis not present

## 2023-09-29 DIAGNOSIS — I10 Essential (primary) hypertension: Secondary | ICD-10-CM | POA: Diagnosis not present

## 2023-09-30 DIAGNOSIS — J3089 Other allergic rhinitis: Secondary | ICD-10-CM | POA: Diagnosis not present

## 2023-09-30 DIAGNOSIS — J301 Allergic rhinitis due to pollen: Secondary | ICD-10-CM | POA: Diagnosis not present

## 2023-09-30 DIAGNOSIS — J3081 Allergic rhinitis due to animal (cat) (dog) hair and dander: Secondary | ICD-10-CM | POA: Diagnosis not present

## 2023-10-16 DIAGNOSIS — J3089 Other allergic rhinitis: Secondary | ICD-10-CM | POA: Diagnosis not present

## 2023-10-16 DIAGNOSIS — J301 Allergic rhinitis due to pollen: Secondary | ICD-10-CM | POA: Diagnosis not present

## 2023-10-16 DIAGNOSIS — J3081 Allergic rhinitis due to animal (cat) (dog) hair and dander: Secondary | ICD-10-CM | POA: Diagnosis not present

## 2023-10-20 DIAGNOSIS — K080 Exfoliation of teeth due to systemic causes: Secondary | ICD-10-CM | POA: Diagnosis not present

## 2023-11-10 DIAGNOSIS — J3089 Other allergic rhinitis: Secondary | ICD-10-CM | POA: Diagnosis not present

## 2023-11-10 DIAGNOSIS — J301 Allergic rhinitis due to pollen: Secondary | ICD-10-CM | POA: Diagnosis not present

## 2023-11-10 DIAGNOSIS — J3081 Allergic rhinitis due to animal (cat) (dog) hair and dander: Secondary | ICD-10-CM | POA: Diagnosis not present

## 2023-11-17 DIAGNOSIS — J3089 Other allergic rhinitis: Secondary | ICD-10-CM | POA: Diagnosis not present

## 2023-11-17 DIAGNOSIS — J3081 Allergic rhinitis due to animal (cat) (dog) hair and dander: Secondary | ICD-10-CM | POA: Diagnosis not present

## 2023-11-17 DIAGNOSIS — J301 Allergic rhinitis due to pollen: Secondary | ICD-10-CM | POA: Diagnosis not present

## 2023-11-23 DIAGNOSIS — J3081 Allergic rhinitis due to animal (cat) (dog) hair and dander: Secondary | ICD-10-CM | POA: Diagnosis not present

## 2023-11-23 DIAGNOSIS — J301 Allergic rhinitis due to pollen: Secondary | ICD-10-CM | POA: Diagnosis not present

## 2023-11-23 DIAGNOSIS — J3089 Other allergic rhinitis: Secondary | ICD-10-CM | POA: Diagnosis not present

## 2023-11-26 DIAGNOSIS — M5106 Intervertebral disc disorders with myelopathy, lumbar region: Secondary | ICD-10-CM | POA: Diagnosis not present

## 2023-11-26 DIAGNOSIS — M791 Myalgia, unspecified site: Secondary | ICD-10-CM | POA: Diagnosis not present

## 2023-11-26 DIAGNOSIS — G894 Chronic pain syndrome: Secondary | ICD-10-CM | POA: Diagnosis not present

## 2023-11-26 DIAGNOSIS — M4326 Fusion of spine, lumbar region: Secondary | ICD-10-CM | POA: Diagnosis not present

## 2023-12-04 DIAGNOSIS — J3089 Other allergic rhinitis: Secondary | ICD-10-CM | POA: Diagnosis not present

## 2023-12-04 DIAGNOSIS — J301 Allergic rhinitis due to pollen: Secondary | ICD-10-CM | POA: Diagnosis not present

## 2023-12-04 DIAGNOSIS — J3081 Allergic rhinitis due to animal (cat) (dog) hair and dander: Secondary | ICD-10-CM | POA: Diagnosis not present

## 2023-12-15 DIAGNOSIS — J3081 Allergic rhinitis due to animal (cat) (dog) hair and dander: Secondary | ICD-10-CM | POA: Diagnosis not present

## 2023-12-15 DIAGNOSIS — J301 Allergic rhinitis due to pollen: Secondary | ICD-10-CM | POA: Diagnosis not present

## 2023-12-15 DIAGNOSIS — J3089 Other allergic rhinitis: Secondary | ICD-10-CM | POA: Diagnosis not present

## 2023-12-18 NOTE — Progress Notes (Unsigned)
 Office Visit Note  Patient: [name]             Date of Birth: Sep 10, 1956           MRN: [id number]             PCP: Gaspar Garbe, MD Referring: Lewis Moccasin, MD Visit Date: 12/23/2023 Occupation: @GUAROCC @  Subjective:  Left hip pain   History of Present Illness: [name] is a 68 y.o. female with history of fibromyalgia and osteoarthritis.   Patient presents today with trapezius muscle tension tenderness bilaterally.  She experiences increased tension and limited range of motion of the cervical spine intermittently.  She denies any symptoms of radiculopathy.  In the past she has had symptomatic relief with trigger point injections.  She requested a left trapezius trigger point injection today.  She is also having ongoing discomfort in the left hip consistent with trochanter bursitis.  She denies any groin pain.  She denies any recent injury.  She experiences pain at night when lying on her left side.  She requested a left trochanteric bursa cortisone injection today. Overall the patient has been sleeping better at night taking trazodone 50 mg 1 tablet at bedtime.  Trazodone has been helpful with sleep onset.    Activities of Daily Living:  Patient reports morning stiffness for 30-40 minutes.   Patient Reports nocturnal pain.  Difficulty dressing/grooming: Denies Difficulty climbing stairs: Denies Difficulty getting out of chair: Denies Difficulty using hands for taps, buttons, cutlery, and/or writing: Reports  Review of Systems  Constitutional:  Positive for fatigue.  HENT:  Negative for mouth sores, mouth dryness and nose dryness.   Eyes:  Positive for dryness. Negative for pain.  Respiratory:  Negative for shortness of breath and difficulty breathing.   Cardiovascular:  Negative for chest pain and palpitations.  Gastrointestinal:  Negative for blood in stool, constipation and diarrhea.  Endocrine: Negative for increased urination.  Genitourinary:   Negative for involuntary urination.  Musculoskeletal:  Positive for joint pain, joint pain, joint swelling, myalgias, muscle weakness, morning stiffness, muscle tenderness and myalgias. Negative for gait problem.  Skin:  Negative for color change, rash, hair loss and sensitivity to sunlight.  Allergic/Immunologic: Negative for susceptible to infections.  Neurological:  Negative for dizziness and headaches.  Hematological:  Negative for swollen glands.  Psychiatric/Behavioral:  Negative for depressed mood and sleep disturbance. The patient is not nervous/anxious.     PMFS History:  Patient Active Problem List   Diagnosis Date Noted   S/P total knee arthroplasty, right 09/18/2020   Benign hypertension 08/02/2018   Chronic back pain 08/02/2018   Diabetes mellitus (HCC) 08/02/2018   Hyperlipidemia 08/02/2018   Plantar fasciitis 07/11/2018   Acquired trigger finger 03/17/2018   Primary osteoarthritis of both knees 11/16/2017   DDD (degenerative disc disease), lumbar 11/16/2017   History of asthma 11/16/2017   History of gastroesophageal reflux (GERD) 11/16/2017   History of hypertension 11/16/2017   History of neuropathy 11/16/2017   History of hypercholesterolemia 11/16/2017   Mass of hand 11/09/2017   Pain in right hand 11/09/2017   Trigger finger 11/09/2017   Fibromyalgia 07/26/2016   Greater trochanteric bursitis of both hips 07/26/2016   Chronic fatigue 07/26/2016   Other insomnia 07/26/2016   Rectal bleeding 01/31/2014    Past Medical History:  Diagnosis Date   Arthritis    RIGHT KNEE   Asthma    Chronic back pain    DDD (degenerative disc  disease), lumbar    Diabetes mellitus type 2, diet-controlled (HCC)    pt denies    Fibromyalgia    GERD (gastroesophageal reflux disease)    Hemorrhoids    INTERNAL AND EXTERNAL   History of colon polyps    2009-- BENIGN   Hyperlipidemia    Hypertension    PONV (postoperative nausea and vomiting)    Right knee meniscal tear      Family History  Problem Relation Age of Onset   Heart disease Mother    Heart attack Mother    Arthritis Father    Diabetes Sister    Diabetes Brother    Hypertension Brother    Stomach cancer Brother    Past Surgical History:  Procedure Laterality Date   CARDIOVASCULAR STRESS TEST  07-06-2013   no ischemia or infarct/  normal LV function and wall motion , ef 63%   COLONOSCOPY W/ POLYPECTOMY  01-10-2008   KNEE ARTHROSCOPY Right 10-14-2006   KNEE ARTHROSCOPY WITH LATERAL MENISECTOMY Right 05/18/2015   Procedure: KNEE ARTHROSCOPY WITH PARTIAL  LATERAL MENISECTOMY;  Surgeon: Durene Romans, MD;  Location: [facility];  Service: Orthopedics;  Laterality: Right;   KNEE ARTHROSCOPY WITH MEDIAL MENISECTOMY Right 05/18/2015   Procedure: KNEE ARTHROSCOPY WITH PARTIAL  MEDIAL MENISECTOMY;  Surgeon: Durene Romans, MD;  Location: [facility];  Service: Orthopedics;  Laterality: Right;   LIPOMA EXCISION Right 01/2018   right thumb lipoma    PULLEY RELEASE RIGHT LONG AND SMALL FINGERS  01-30-2006   RECONSTRUCTION OF ELBOW Bilateral right 06-03-2005  &  left  06-12-2011   ROBOTIC ASSITED PARTIAL NEPHRECTOMY Left 10-16-2008   oncocytoma ( negative neoplasm)   SHOULDER ARTHROSCOPY W/ SUBACROMIAL DECOMPRESSION AND DISTAL CLAVICLE EXCISION Left 07-07-2007   and ROTATOR CUFF REPAIR   SIGMOIDOSCOPY  02-02-2014   TOTAL ABDOMINAL HYSTERECTOMY W/ BILATERAL SALPINGOOPHORECTOMY  1979   TOTAL KNEE ARTHROPLASTY Right 09/18/2020   Procedure: TOTAL KNEE ARTHROPLASTY;  Surgeon: Durene Romans, MD;  Location: [facility];  Service: Orthopedics;  Laterality: Right;  70 mins   Social History   Social History Narrative   Not on file   Immunization History  Administered Date(s) Administered   Influenza,inj,Quad PF,6+ Mos 06/30/2018   Moderna Sars-Covid-2 Vaccination 11/05/2019, 12/04/2019, 08/22/2020, 02/06/2021, 07/26/2021     Objective: Vital Signs: BP 131/80 (BP Location: Left  Arm, Patient Position: Sitting, Cuff Size: Normal)   Pulse 68   Resp 14   Ht 5\' 6"  (1.676 m)   Wt 170 lb 9.6 oz (77.4 kg)   BMI 27.54 kg/m    Physical Exam Vitals and nursing note reviewed.  Constitutional:      Appearance: She is well-developed.  HENT:     Head: Normocephalic and atraumatic.  Eyes:     Conjunctiva/sclera: Conjunctivae normal.  Cardiovascular:     Rate and Rhythm: Normal rate and regular rhythm.     Heart sounds: Normal heart sounds.  Pulmonary:     Effort: Pulmonary effort is normal.     Breath sounds: Normal breath sounds.  Abdominal:     General: Bowel sounds are normal.     Palpations: Abdomen is soft.  Musculoskeletal:     Cervical back: Normal range of motion.  Lymphadenopathy:     Cervical: No cervical adenopathy.  Skin:    General: Skin is warm and dry.     Capillary Refill: Capillary refill takes less than 2 seconds.  Neurological:     Mental Status: She is alert  and oriented to person, place, and time.  Psychiatric:        Behavior: Behavior normal.      Musculoskeletal Exam: C-spine has limited range of motion without rotation.  Trapezius muscle tension tenderness bilaterally, left greater than right.  Limited mobility of the lumbar spine.  Shoulder joints, elbow joints, wrist joints and MCPs, PIPs, DIPs have good range of motion with no synovitis.  Complete fist formation bilaterally.  Hip joints have good range of motion with no groin pain.  Tenderness over the left trochanteric bursa.  Right knee replacement has good range of motion with no warmth or effusion.  Left knee joint has good range of motion no warmth or effusion.  Tenderness of the plantar fascia on the right foot.  Ankle joints have good range of motion with no tenderness or joint swelling.  No tenderness or synovitis over MTP joints.  CDAI Exam: CDAI Score: -- Patient Global: --; Provider Global: -- Swollen: --; Tender: -- Joint Exam 12/23/2023   No joint exam has been  documented for this visit   There is currently no information documented on the homunculus. Go to the Rheumatology activity and complete the homunculus joint exam.  Investigation: No additional findings.  Imaging: No results found.  Recent Labs: Lab Results  Component Value Date   WBC 12.2 (H) 09/19/2020   HGB 10.7 (L) 09/19/2020   PLT 175 09/19/2020   NA 137 09/19/2020   K 3.9 09/19/2020   CL 104 09/19/2020   CO2 25 09/19/2020   GLUCOSE 130 (H) 09/19/2020   BUN 19 09/19/2020   CREATININE 0.97 09/19/2020   BILITOT 0.3 06/28/2013   ALKPHOS 68 06/28/2013   AST 14 06/28/2013   ALT 11 06/28/2013   PROT 7.6 06/28/2013   ALBUMIN 3.8 06/28/2013   CALCIUM 8.9 09/19/2020   GFRAA >60 05/08/2015    Speciality Comments: No specialty comments available.  Procedures:  Large Joint Inj: L greater trochanter on 12/23/2023 10:35 AM Indications: pain Details: 27 G 1.5 in needle, lateral approach  Arthrogram: No  Medications: 1.5 mL lidocaine 1 %; 40 mg triamcinolone acetonide 40 MG/ML Aspirate: 0 mL Outcome: tolerated well, no immediate complications Procedure, treatment alternatives, risks and benefits explained, specific risks discussed. Consent was given by the patient. Immediately prior to procedure a time out was called to verify the correct patient, procedure, equipment, support staff and site/side marked as required. Patient was prepped and draped in the usual sterile fashion.    Trigger Point Inj  Date/Time: 12/23/2023 10:35 AM  Performed by: Gearldine Bienenstock, PA-C Authorized by: Gearldine Bienenstock, PA-C   Consent Given by:  Patient Site marked: the procedure site was marked   Timeout: prior to procedure the correct patient, procedure, and site was verified   Indications:  Pain Total # of Trigger Points:  1 (Left trapezius trigger point injection) Location: neck   Needle Size:  27 G Approach:  Dorsal Medications #1:  0.5 mL lidocaine 1 %; 10 mg triamcinolone acetonide 40  MG/ML Patient tolerance:  Patient tolerated the procedure well with no immediate complications  Allergies: Dilaudid [hydromorphone], Levofloxacin, Macrobid [nitrofurantoin macrocrystal], Oxycodone-acetaminophen, Penicillin g benzathine, Seasonal ic [cholestatin], Cyclobenzaprine, Penicillins, Percocet [oxycodone-acetaminophen], and Zanaflex [tizanidine hcl]   Assessment / Plan:     Visit Diagnoses: Fibromyalgia: She experiences intermittent myalgias and muscle tenderness due to fibromyalgia.  She presents today with trapezius muscle tension and tenderness bilaterally.  She is also been experiencing persistent discomfort on the lateral aspect  of the left hip consistent with trochanter bursitis.  A left trapezius trigger point injection was performed today along with a left trochanteric bursa cortisone injection.  She tolerated both procedures and both procedure notes were completed above.  Discussed the importance of regular exercise and good sleep hygiene.  She has been taking trazodone 50 mg 1 tablet at bedtime which has helped with sleep onset. She will follow-up in the office in 6 months or sooner if needed.  Chronic fatigue: Chronic, stable.  Other insomnia - She takes trazodone 50 mg 1 tablet by mouth at bedtime for insomnia.  Patient continues to find trazodone to be effective at helping with sleep onset.  Trapezius muscle spasm - She has trapezius muscle tension and tenderness bilaterally, left greater than right.  She has been experiencing increased muscle tension and limited ROM with lateral rotation of the cervical spine at times.  No symptoms of radiculopathy.  Patient requested a left trapezius trigger point injection today.  She tolerated procedure well.  Procedure note was completed above.  Aftercare was discussed.  Trigger finger, right ring finger - Not currently symptomatic.  Pain in left hip -Patient presents today with ongoing discomfort in the left hip consistent with trochanter  bursitis and IT band syndrome.  On examination she has good range of motion of the left hip joint with no groin pain.  X-rays of the left hip were obtained today for further evaluation as requested.  Plan: XR HIP UNILAT W OR W/O PELVIS 2-3 VIEWS LEFT  Trochanteric bursitis, left hip: Patient presents today with pain on the lateral aspect of the left hip consistent with trochanter bursitis and IT band syndrome.  She has difficulty lying on her left side at night due to discomfort.  Different treatment options were discussed including physical therapy, home exercises, and a cortisone injection.  Patient requested a left trochanteric bursa cortisone injection today.  She tolerated the procedure well.  Procedure note was completed above.  Aftercare was discussed.  She was advised to notify us if her symptoms persist or worsen.  She was given a handout of exercises to perform once her symptoms have improved.  Status post right knee replacement: Doing well.  Good ROM with no warmth or effusion.   Primary osteoarthritis of left knee: No warmth or effusion noted.   Spondylosis of lumbar spine - Status post fusion.  She is followed by Dr. Noel Gerold.   Other medical conditions are listed as follows:   History of hypertension: Blood pressure was 131/80 today in the office.  Patient was advised to monitor blood pressure closely following the cortisone injections today.   History of hypercholesterolemia  History of gastroesophageal reflux (GERD)  History of asthma  Orders: Orders Placed This Encounter  Procedures   Large Joint Inj   Trigger Point Inj   XR HIP UNILAT W OR W/O PELVIS 2-3 VIEWS LEFT   No orders of the defined types were placed in this encounter.    Follow-Up Instructions: Return in about 6 months (around 06/23/2024) for Fibromyalgia.   Gearldine Bienenstock, PA-C  Note - This record has been created using Dragon software.  Chart creation errors have been sought, but may not always  have  been located. Such creation errors do not reflect on  the standard of medical care.

## 2023-12-22 ENCOUNTER — Ambulatory Visit: Admitting: Physician Assistant

## 2023-12-23 ENCOUNTER — Encounter: Payer: Self-pay | Admitting: Physician Assistant

## 2023-12-23 ENCOUNTER — Ambulatory Visit (INDEPENDENT_AMBULATORY_CARE_PROVIDER_SITE_OTHER)

## 2023-12-23 ENCOUNTER — Ambulatory Visit: Attending: Physician Assistant | Admitting: Physician Assistant

## 2023-12-23 VITALS — BP 131/80 | HR 68 | Resp 14 | Ht 66.0 in | Wt 170.6 lb

## 2023-12-23 DIAGNOSIS — M62838 Other muscle spasm: Secondary | ICD-10-CM

## 2023-12-23 DIAGNOSIS — M1712 Unilateral primary osteoarthritis, left knee: Secondary | ICD-10-CM

## 2023-12-23 DIAGNOSIS — Z8639 Personal history of other endocrine, nutritional and metabolic disease: Secondary | ICD-10-CM

## 2023-12-23 DIAGNOSIS — M25552 Pain in left hip: Secondary | ICD-10-CM

## 2023-12-23 DIAGNOSIS — J3089 Other allergic rhinitis: Secondary | ICD-10-CM | POA: Diagnosis not present

## 2023-12-23 DIAGNOSIS — M65341 Trigger finger, right ring finger: Secondary | ICD-10-CM

## 2023-12-23 DIAGNOSIS — G4709 Other insomnia: Secondary | ICD-10-CM | POA: Diagnosis not present

## 2023-12-23 DIAGNOSIS — Z96651 Presence of right artificial knee joint: Secondary | ICD-10-CM

## 2023-12-23 DIAGNOSIS — J3081 Allergic rhinitis due to animal (cat) (dog) hair and dander: Secondary | ICD-10-CM | POA: Diagnosis not present

## 2023-12-23 DIAGNOSIS — M797 Fibromyalgia: Secondary | ICD-10-CM | POA: Diagnosis not present

## 2023-12-23 DIAGNOSIS — R5382 Chronic fatigue, unspecified: Secondary | ICD-10-CM

## 2023-12-23 DIAGNOSIS — M7062 Trochanteric bursitis, left hip: Secondary | ICD-10-CM

## 2023-12-23 DIAGNOSIS — M7061 Trochanteric bursitis, right hip: Secondary | ICD-10-CM

## 2023-12-23 DIAGNOSIS — M47816 Spondylosis without myelopathy or radiculopathy, lumbar region: Secondary | ICD-10-CM

## 2023-12-23 DIAGNOSIS — Z8709 Personal history of other diseases of the respiratory system: Secondary | ICD-10-CM

## 2023-12-23 DIAGNOSIS — J301 Allergic rhinitis due to pollen: Secondary | ICD-10-CM | POA: Diagnosis not present

## 2023-12-23 DIAGNOSIS — Z8679 Personal history of other diseases of the circulatory system: Secondary | ICD-10-CM

## 2023-12-23 DIAGNOSIS — Z8719 Personal history of other diseases of the digestive system: Secondary | ICD-10-CM

## 2023-12-23 MED ORDER — LIDOCAINE HCL 1 % IJ SOLN
0.5000 mL | INTRAMUSCULAR | Status: AC | PRN
Start: 2023-12-23 — End: 2023-12-23
  Administered 2023-12-23: .5 mL

## 2023-12-23 MED ORDER — LIDOCAINE HCL 1 % IJ SOLN
1.5000 mL | INTRAMUSCULAR | Status: AC | PRN
Start: 2023-12-23 — End: 2023-12-23
  Administered 2023-12-23: 1.5 mL

## 2023-12-23 MED ORDER — TRIAMCINOLONE ACETONIDE 40 MG/ML IJ SUSP
40.0000 mg | INTRAMUSCULAR | Status: AC | PRN
Start: 2023-12-23 — End: 2023-12-23
  Administered 2023-12-23: 40 mg via INTRA_ARTICULAR

## 2023-12-23 MED ORDER — TRIAMCINOLONE ACETONIDE 40 MG/ML IJ SUSP
10.0000 mg | INTRAMUSCULAR | Status: AC | PRN
Start: 2023-12-23 — End: 2023-12-23
  Administered 2023-12-23: 10 mg via INTRAMUSCULAR

## 2023-12-23 NOTE — Patient Instructions (Signed)
 Hip Bursitis Rehab Ask your health care provider which exercises are safe for you. Do exercises exactly as told by your health care provider and adjust them as directed. It is normal to feel mild stretching, pulling, tightness, or discomfort as you do these exercises. Stop right away if you feel sudden pain or your pain gets worse. Do not begin these exercises until told by your health care provider. Stretching exercise This exercise warms up your muscles and joints and improves the movement and flexibility of your hip. This exercise also helps to relieve pain and stiffness. Iliotibial band stretch An iliotibial band is a strong band of muscle tissue that runs from the outer side of your hip to the outer side of your thigh and knee. Lie on your side with your left / right leg in the top position. Bend your left / right knee and grab your ankle. Stretch out your bottom arm to help you balance. Slowly bring your knee back so your thigh is slightly behind your body. Slowly lower your knee toward the floor until you feel a gentle stretch on the outside of your left / right thigh. If you do not feel a stretch and your knee will not lower more toward the floor, place the heel of your other foot on top of your knee and pull your knee down toward the floor with your foot. Hold this position for __________ seconds. Slowly return to the starting position. Repeat __________ times. Complete this exercise __________ times a day. Strengthening exercises These exercises build strength and endurance in your hip and pelvis. Endurance is the ability to use your muscles for a long time, even after they get tired. Bridge This exercise strengthens the muscles that move your thigh backward (hip extensors). Lie on your back on a firm surface with your knees bent and your feet flat on the floor. Tighten your buttocks muscles and lift your buttocks off the floor until your trunk is level with your thighs. Do not arch your  back. You should feel the muscles working in your buttocks and the back of your thighs. If you do not feel these muscles, slide your feet 1-2 inches (2.5-5 cm) farther away from your buttocks. If this exercise is too easy, try doing it with your arms crossed over your chest. Hold this position for __________ seconds. Slowly lower your hips to the starting position. Let your muscles relax completely after each repetition. Repeat __________ times. Complete this exercise __________ times a day. Squats This exercise strengthens the muscles in front of your thigh and knee (quadriceps). Stand in front of a table, with your feet and knees pointing straight ahead. You may rest your hands on the table for balance but not for support. Slowly bend your knees and lower your hips like you are going to sit in a chair. Keep your weight over your heels, not over your toes. Keep your lower legs upright so they are parallel with the table legs. Do not let your hips go lower than your knees. Do not bend lower than told by your health care provider. If your hip pain increases, do not bend as low. Hold the squat position for __________ seconds. Slowly push with your legs to return to standing. Do not use your hands to pull yourself to standing. Repeat __________ times. Complete this exercise __________ times a day. Hip hike  Stand sideways on a bottom step. Stand on your left / right leg with your other foot unsupported next to  the step. You can hold on to the railing or wall for balance if needed. Keep your knees straight and your torso square. Then lift your left / right hip up toward the ceiling. Hold this position for __________ seconds. Slowly let your left / right hip lower toward the floor, past the starting position. Your foot should get closer to the floor. Do not lean or bend your knees. Repeat __________ times. Complete this exercise __________ times a day. Single leg stand This exercise increases  your balance. Without shoes, stand near a railing or in a doorway. You may hold on to the railing or door frame as needed for balance. Squeeze your left / right buttock muscles, then lift up your other foot. Do not let your left / right hip push out to the side. It is helpful to stand in front of a mirror for this exercise so you can watch your hip. Hold this position for __________ seconds. Repeat __________ times. Complete this exercise __________ times a day. This information is not intended to replace advice given to you by your health care provider. Make sure you discuss any questions you have with your health care provider. Document Revised: 08/21/2021 Document Reviewed: 08/21/2021 Elsevier Patient Education  2024 [employer].

## 2023-12-23 NOTE — Progress Notes (Signed)
 X-rays of the left hip are consistent with mild osteoarthritis.  Please notify the patient.

## 2023-12-30 DIAGNOSIS — K080 Exfoliation of teeth due to systemic causes: Secondary | ICD-10-CM | POA: Diagnosis not present

## 2024-01-13 DIAGNOSIS — K080 Exfoliation of teeth due to systemic causes: Secondary | ICD-10-CM | POA: Diagnosis not present

## 2024-01-14 DIAGNOSIS — J301 Allergic rhinitis due to pollen: Secondary | ICD-10-CM | POA: Diagnosis not present

## 2024-01-14 DIAGNOSIS — J3081 Allergic rhinitis due to animal (cat) (dog) hair and dander: Secondary | ICD-10-CM | POA: Diagnosis not present

## 2024-01-14 DIAGNOSIS — J3089 Other allergic rhinitis: Secondary | ICD-10-CM | POA: Diagnosis not present

## 2024-01-27 DIAGNOSIS — G894 Chronic pain syndrome: Secondary | ICD-10-CM | POA: Diagnosis not present

## 2024-01-27 DIAGNOSIS — Z1330 Encounter for screening examination for mental health and behavioral disorders, unspecified: Secondary | ICD-10-CM | POA: Diagnosis not present

## 2024-02-02 DIAGNOSIS — J3081 Allergic rhinitis due to animal (cat) (dog) hair and dander: Secondary | ICD-10-CM | POA: Diagnosis not present

## 2024-02-02 DIAGNOSIS — J3089 Other allergic rhinitis: Secondary | ICD-10-CM | POA: Diagnosis not present

## 2024-02-02 DIAGNOSIS — J301 Allergic rhinitis due to pollen: Secondary | ICD-10-CM | POA: Diagnosis not present

## 2024-02-18 DIAGNOSIS — J3089 Other allergic rhinitis: Secondary | ICD-10-CM | POA: Diagnosis not present

## 2024-02-18 DIAGNOSIS — J3081 Allergic rhinitis due to animal (cat) (dog) hair and dander: Secondary | ICD-10-CM | POA: Diagnosis not present

## 2024-02-18 DIAGNOSIS — J301 Allergic rhinitis due to pollen: Secondary | ICD-10-CM | POA: Diagnosis not present

## 2024-02-26 DIAGNOSIS — J3081 Allergic rhinitis due to animal (cat) (dog) hair and dander: Secondary | ICD-10-CM | POA: Diagnosis not present

## 2024-02-26 DIAGNOSIS — J301 Allergic rhinitis due to pollen: Secondary | ICD-10-CM | POA: Diagnosis not present

## 2024-02-26 DIAGNOSIS — J3089 Other allergic rhinitis: Secondary | ICD-10-CM | POA: Diagnosis not present

## 2024-03-07 DIAGNOSIS — J3081 Allergic rhinitis due to animal (cat) (dog) hair and dander: Secondary | ICD-10-CM | POA: Diagnosis not present

## 2024-03-07 DIAGNOSIS — J301 Allergic rhinitis due to pollen: Secondary | ICD-10-CM | POA: Diagnosis not present

## 2024-03-07 DIAGNOSIS — J3089 Other allergic rhinitis: Secondary | ICD-10-CM | POA: Diagnosis not present

## 2024-03-15 DIAGNOSIS — J301 Allergic rhinitis due to pollen: Secondary | ICD-10-CM | POA: Diagnosis not present

## 2024-03-15 DIAGNOSIS — J3089 Other allergic rhinitis: Secondary | ICD-10-CM | POA: Diagnosis not present

## 2024-03-15 DIAGNOSIS — J3081 Allergic rhinitis due to animal (cat) (dog) hair and dander: Secondary | ICD-10-CM | POA: Diagnosis not present

## 2024-03-18 DIAGNOSIS — J301 Allergic rhinitis due to pollen: Secondary | ICD-10-CM | POA: Diagnosis not present

## 2024-03-18 DIAGNOSIS — J3089 Other allergic rhinitis: Secondary | ICD-10-CM | POA: Diagnosis not present

## 2024-03-18 DIAGNOSIS — L2089 Other atopic dermatitis: Secondary | ICD-10-CM | POA: Diagnosis not present

## 2024-03-18 DIAGNOSIS — J4530 Mild persistent asthma, uncomplicated: Secondary | ICD-10-CM | POA: Diagnosis not present

## 2024-03-22 DIAGNOSIS — J3089 Other allergic rhinitis: Secondary | ICD-10-CM | POA: Diagnosis not present

## 2024-03-22 DIAGNOSIS — J301 Allergic rhinitis due to pollen: Secondary | ICD-10-CM | POA: Diagnosis not present

## 2024-03-22 DIAGNOSIS — J3081 Allergic rhinitis due to animal (cat) (dog) hair and dander: Secondary | ICD-10-CM | POA: Diagnosis not present

## 2024-03-23 DIAGNOSIS — I10 Essential (primary) hypertension: Secondary | ICD-10-CM | POA: Diagnosis not present

## 2024-03-28 DIAGNOSIS — J3089 Other allergic rhinitis: Secondary | ICD-10-CM | POA: Diagnosis not present

## 2024-03-28 DIAGNOSIS — G894 Chronic pain syndrome: Secondary | ICD-10-CM | POA: Diagnosis not present

## 2024-03-28 DIAGNOSIS — M545 Low back pain, unspecified: Secondary | ICD-10-CM | POA: Diagnosis not present

## 2024-03-28 DIAGNOSIS — J3081 Allergic rhinitis due to animal (cat) (dog) hair and dander: Secondary | ICD-10-CM | POA: Diagnosis not present

## 2024-03-28 DIAGNOSIS — J301 Allergic rhinitis due to pollen: Secondary | ICD-10-CM | POA: Diagnosis not present

## 2024-03-28 DIAGNOSIS — N760 Acute vaginitis: Secondary | ICD-10-CM | POA: Diagnosis not present

## 2024-03-28 DIAGNOSIS — M791 Myalgia, unspecified site: Secondary | ICD-10-CM | POA: Diagnosis not present

## 2024-03-28 DIAGNOSIS — N390 Urinary tract infection, site not specified: Secondary | ICD-10-CM | POA: Diagnosis not present

## 2024-04-05 DIAGNOSIS — J301 Allergic rhinitis due to pollen: Secondary | ICD-10-CM | POA: Diagnosis not present

## 2024-04-05 DIAGNOSIS — J3089 Other allergic rhinitis: Secondary | ICD-10-CM | POA: Diagnosis not present

## 2024-04-05 DIAGNOSIS — J3081 Allergic rhinitis due to animal (cat) (dog) hair and dander: Secondary | ICD-10-CM | POA: Diagnosis not present

## 2024-04-12 DIAGNOSIS — J301 Allergic rhinitis due to pollen: Secondary | ICD-10-CM | POA: Diagnosis not present

## 2024-04-12 DIAGNOSIS — J3089 Other allergic rhinitis: Secondary | ICD-10-CM | POA: Diagnosis not present

## 2024-04-12 DIAGNOSIS — J3081 Allergic rhinitis due to animal (cat) (dog) hair and dander: Secondary | ICD-10-CM | POA: Diagnosis not present

## 2024-04-22 DIAGNOSIS — J301 Allergic rhinitis due to pollen: Secondary | ICD-10-CM | POA: Diagnosis not present

## 2024-04-22 DIAGNOSIS — J3081 Allergic rhinitis due to animal (cat) (dog) hair and dander: Secondary | ICD-10-CM | POA: Diagnosis not present

## 2024-04-22 DIAGNOSIS — J3089 Other allergic rhinitis: Secondary | ICD-10-CM | POA: Diagnosis not present

## 2024-04-26 DIAGNOSIS — M48061 Spinal stenosis, lumbar region without neurogenic claudication: Secondary | ICD-10-CM | POA: Diagnosis not present

## 2024-04-26 DIAGNOSIS — Z981 Arthrodesis status: Secondary | ICD-10-CM | POA: Diagnosis not present

## 2024-04-26 DIAGNOSIS — M5416 Radiculopathy, lumbar region: Secondary | ICD-10-CM | POA: Diagnosis not present

## 2024-05-11 DIAGNOSIS — J3081 Allergic rhinitis due to animal (cat) (dog) hair and dander: Secondary | ICD-10-CM | POA: Diagnosis not present

## 2024-05-11 DIAGNOSIS — J3089 Other allergic rhinitis: Secondary | ICD-10-CM | POA: Diagnosis not present

## 2024-05-11 DIAGNOSIS — J301 Allergic rhinitis due to pollen: Secondary | ICD-10-CM | POA: Diagnosis not present

## 2024-05-18 DIAGNOSIS — J301 Allergic rhinitis due to pollen: Secondary | ICD-10-CM | POA: Diagnosis not present

## 2024-05-18 DIAGNOSIS — J3081 Allergic rhinitis due to animal (cat) (dog) hair and dander: Secondary | ICD-10-CM | POA: Diagnosis not present

## 2024-05-18 DIAGNOSIS — J3089 Other allergic rhinitis: Secondary | ICD-10-CM | POA: Diagnosis not present

## 2024-05-19 DIAGNOSIS — K080 Exfoliation of teeth due to systemic causes: Secondary | ICD-10-CM | POA: Diagnosis not present

## 2024-05-30 DIAGNOSIS — M545 Low back pain, unspecified: Secondary | ICD-10-CM | POA: Diagnosis not present

## 2024-05-30 DIAGNOSIS — M4727 Other spondylosis with radiculopathy, lumbosacral region: Secondary | ICD-10-CM | POA: Diagnosis not present

## 2024-05-30 DIAGNOSIS — J301 Allergic rhinitis due to pollen: Secondary | ICD-10-CM | POA: Diagnosis not present

## 2024-05-30 DIAGNOSIS — G894 Chronic pain syndrome: Secondary | ICD-10-CM | POA: Diagnosis not present

## 2024-05-30 DIAGNOSIS — J3081 Allergic rhinitis due to animal (cat) (dog) hair and dander: Secondary | ICD-10-CM | POA: Diagnosis not present

## 2024-05-30 DIAGNOSIS — J3089 Other allergic rhinitis: Secondary | ICD-10-CM | POA: Diagnosis not present

## 2024-05-30 DIAGNOSIS — M791 Myalgia, unspecified site: Secondary | ICD-10-CM | POA: Diagnosis not present

## 2024-06-09 DIAGNOSIS — R002 Palpitations: Secondary | ICD-10-CM | POA: Diagnosis not present

## 2024-06-09 DIAGNOSIS — R5383 Other fatigue: Secondary | ICD-10-CM | POA: Diagnosis not present

## 2024-06-09 DIAGNOSIS — I10 Essential (primary) hypertension: Secondary | ICD-10-CM | POA: Diagnosis not present

## 2024-06-16 DIAGNOSIS — J301 Allergic rhinitis due to pollen: Secondary | ICD-10-CM | POA: Diagnosis not present

## 2024-06-16 DIAGNOSIS — J3081 Allergic rhinitis due to animal (cat) (dog) hair and dander: Secondary | ICD-10-CM | POA: Diagnosis not present

## 2024-06-16 DIAGNOSIS — J3089 Other allergic rhinitis: Secondary | ICD-10-CM | POA: Diagnosis not present

## 2024-06-16 NOTE — Progress Notes (Signed)
 Office Visit Note  Patient: [name]             Date of Birth: 21-Aug-1956           MRN: [id number]             PCP: Vernadine Charlie ORN, MD Referring: Vernadine Charlie ORN, MD Visit Date: 06/30/2024 Occupation: Data Unavailable  Subjective:  Trapezius spasm   History of Present Illness: [name] is a 68 y.o. female with a osteoarthritis, degenerative disc disease and fibromyalgia syndrome.  She returns today after her last visit in April 2025.  She states she has been having neck pain and stiffness.  She wants to have bilateral trapezius injections.  She states she was having increased lower back pain and was evaluated by Dr. Gust.  She was told after the MRI that she has loosening of the screws.  She was given a prednisone  taper and Toradol  shot which helped for some time.  She has a follow-up appointment coming up.  She had no recurrence of right trigger finger.  She noticed improvement in the left trochanteric bursa pain after the prednisone  taper but is coming back.  She denies any discomfort in the knee replacements.  She continues to have generalized pain from fibromyalgia    Activities of Daily Living:  Patient reports morning stiffness for 1 hour.   Patient Denies nocturnal pain.  Difficulty dressing/grooming: Denies Difficulty climbing stairs: Denies Difficulty getting out of chair: Reports Difficulty using hands for taps, buttons, cutlery, and/or writing: Denies  Review of Systems  Constitutional:  Positive for fatigue.  HENT:  Negative for mouth sores and mouth dryness.   Eyes:  Negative for dryness.  Respiratory:  Positive for shortness of breath.   Cardiovascular:  Negative for chest pain and palpitations.  Gastrointestinal:  Negative for blood in stool, constipation and diarrhea.  Endocrine: Positive for increased urination.  Genitourinary:  Positive for involuntary urination.  Musculoskeletal:  Positive for joint pain, joint pain, joint swelling,  myalgias, muscle weakness, morning stiffness, muscle tenderness and myalgias. Negative for gait problem.  Skin:  Negative for color change, rash, hair loss and sensitivity to sunlight.  Allergic/Immunologic: Negative for susceptible to infections.  Neurological:  Negative for dizziness and headaches.  Hematological:  Negative for swollen glands.  Psychiatric/Behavioral:  Negative for depressed mood and sleep disturbance. The patient is not nervous/anxious.     PMFS History:  Patient Active Problem List   Diagnosis Date Noted   S/P total knee arthroplasty, right 09/18/2020   Benign hypertension 08/02/2018   Chronic back pain 08/02/2018   Diabetes mellitus (HCC) 08/02/2018   Hyperlipidemia 08/02/2018   Plantar fasciitis 07/11/2018   Acquired trigger finger 03/17/2018   Primary osteoarthritis of both knees 11/16/2017   DDD (degenerative disc disease), lumbar 11/16/2017   History of asthma 11/16/2017   History of gastroesophageal reflux (GERD) 11/16/2017   History of hypertension 11/16/2017   History of neuropathy 11/16/2017   History of hypercholesterolemia 11/16/2017   Mass of hand 11/09/2017   Pain in right hand 11/09/2017   Trigger finger 11/09/2017   Fibromyalgia 07/26/2016   Greater trochanteric bursitis of both hips 07/26/2016   Chronic fatigue 07/26/2016   Other insomnia 07/26/2016   Rectal bleeding 01/31/2014    Past Medical History:  Diagnosis Date   Arthritis    RIGHT KNEE   Asthma    Chronic back pain    DDD (degenerative disc disease), lumbar    Diabetes mellitus  type 2, diet-controlled (HCC)    pt denies    Fibromyalgia    GERD (gastroesophageal reflux disease)    Hemorrhoids    INTERNAL AND EXTERNAL   History of colon polyps    2009-- BENIGN   Hyperlipidemia    Hypertension    PONV (postoperative nausea and vomiting)    Right knee meniscal tear     Family History  Problem Relation Age of Onset   Heart disease Mother    Heart attack Mother     Arthritis Father    Diabetes Sister    Dementia Sister    Diabetes Brother    Hypertension Brother    Stomach cancer Brother    Past Surgical History:  Procedure Laterality Date   CARDIOVASCULAR STRESS TEST  07-06-2013   no ischemia or infarct/  normal LV function and wall motion , ef 63%   COLONOSCOPY W/ POLYPECTOMY  01-10-2008   KNEE ARTHROSCOPY Right 10-14-2006   KNEE ARTHROSCOPY WITH LATERAL MENISECTOMY Right 05/18/2015   Procedure: KNEE ARTHROSCOPY WITH PARTIAL  LATERAL MENISECTOMY;  Surgeon: Donnice Car, MD;  Location: [facility];  Service: Orthopedics;  Laterality: Right;   KNEE ARTHROSCOPY WITH MEDIAL MENISECTOMY Right 05/18/2015   Procedure: KNEE ARTHROSCOPY WITH PARTIAL  MEDIAL MENISECTOMY;  Surgeon: Donnice Car, MD;  Location: [facility];  Service: Orthopedics;  Laterality: Right;   LIPOMA EXCISION Right 01/2018   right thumb lipoma    PULLEY RELEASE RIGHT LONG AND SMALL FINGERS  01-30-2006   RECONSTRUCTION OF ELBOW Bilateral right 06-03-2005  &  left  06-12-2011   ROBOTIC ASSITED PARTIAL NEPHRECTOMY Left 10-16-2008   oncocytoma ( negative neoplasm)   SHOULDER ARTHROSCOPY W/ SUBACROMIAL DECOMPRESSION AND DISTAL CLAVICLE EXCISION Left 07-07-2007   and ROTATOR CUFF REPAIR   SIGMOIDOSCOPY  02-02-2014   TOTAL ABDOMINAL HYSTERECTOMY W/ BILATERAL SALPINGOOPHORECTOMY  1979   TOTAL KNEE ARTHROPLASTY Right 09/18/2020   Procedure: TOTAL KNEE ARTHROPLASTY;  Surgeon: Car Donnice, MD;  Location: [facility];  Service: Orthopedics;  Laterality: Right;  70 mins   Social History   Tobacco Use   Smoking status: Former    Current packs/day: 0.00    Average packs/day: 0.5 packs/day for 7.0 years (3.5 ttl pk-yrs)    Types: Cigarettes    Start date: 2    Quit date: 44    Years since quitting: 44.8    Passive exposure: Never   Smokeless tobacco: Never  Vaping Use   Vaping status: Never Used  Substance Use Topics   Alcohol  use: Not Currently   Drug  use: No   Social History   Social History Narrative   Not on file     Immunization History  Administered Date(s) Administered   Influenza,inj,Quad PF,6+ Mos 06/30/2018   Moderna Sars-Covid-2 Vaccination 11/05/2019, 12/04/2019, 08/22/2020, 02/06/2021, 07/26/2021   Pfizer(Comirnaty)Fall Seasonal Vaccine 12 years and older 07/28/2022     Objective: Vital Signs: BP 117/70   Pulse 73   Temp (!) 96.9 F (36.1 C)   Ht 5' 6 (1.676 m)   Wt 172 lb (78 kg)   BMI 27.76 kg/m    Physical Exam Vitals and nursing note reviewed.  Constitutional:      Appearance: She is well-developed.  HENT:     Head: Normocephalic and atraumatic.  Eyes:     Conjunctiva/sclera: Conjunctivae normal.  Cardiovascular:     Rate and Rhythm: Normal rate and regular rhythm.     Heart sounds: Normal heart sounds.  Pulmonary:  Effort: Pulmonary effort is normal.     Breath sounds: Normal breath sounds.  Abdominal:     General: Bowel sounds are normal.     Palpations: Abdomen is soft.  Musculoskeletal:     Cervical back: Normal range of motion.  Lymphadenopathy:     Cervical: No cervical adenopathy.  Skin:    General: Skin is warm and dry.     Capillary Refill: Capillary refill takes less than 2 seconds.  Neurological:     Mental Status: She is alert and oriented to person, place, and time.  Psychiatric:        Behavior: Behavior normal.      Musculoskeletal Exam: Cervical, thoracic and lumbar spine were in good range of motion.  She had bilateral trapezius spasm.  She had painful limited range of motion of her lumbar spine.  There was no SI joint tenderness.  Shoulder joints, elbow joints, wrist joints, MCPs, PIPs and DIPs were in good range of motion with no synovitis.  Hip joints were in good range of motion.  She had tenderness over bilateral trochanteric bursa.  Right knee joint was replaced and had limited extension.  There was no tenderness over ankles or MTPs.   CDAI Exam: CDAI Score:  -- Patient Global: --; Provider Global: -- Swollen: --; Tender: -- Joint Exam 06/30/2024   No joint exam has been documented for this visit   There is currently no information documented on the homunculus. Go to the Rheumatology activity and complete the homunculus joint exam.  Investigation: No additional findings.  Imaging: No results found.  Recent Labs: Lab Results  Component Value Date   WBC 12.2 (H) 09/19/2020   HGB 10.7 (L) 09/19/2020   PLT 175 09/19/2020   NA 137 09/19/2020   K 3.9 09/19/2020   CL 104 09/19/2020   CO2 25 09/19/2020   GLUCOSE 130 (H) 09/19/2020   BUN 19 09/19/2020   CREATININE 0.97 09/19/2020   BILITOT 0.3 06/28/2013   ALKPHOS 68 06/28/2013   AST 14 06/28/2013   ALT 11 06/28/2013   PROT 7.6 06/28/2013   ALBUMIN 3.8 06/28/2013   CALCIUM  8.9 09/19/2020   GFRAA >60 05/08/2015    Speciality Comments: No specialty comments available.  Procedures:  Trigger Point Inj  Date/Time: 06/30/2024 11:49 AM  Performed by: Dolphus Reiter, MD Authorized by: Dolphus Reiter, MD   Consent Given by:  Patient Site marked: the procedure site was marked   Timeout: prior to procedure the correct patient, procedure, and site was verified   Indications:  Muscle spasm and pain Total # of Trigger Points:  2 Location: neck   Needle Size:  27 G Approach:  Dorsal Medications #1:  0.5 mL lidocaine  1 %; 20 mg triamcinolone  acetonide 40 MG/ML Medications #2:  0.5 mL lidocaine  1 %; 20 mg triamcinolone  acetonide 40 MG/ML Patient tolerance:  Patient tolerated the procedure well with no immediate complications Comments: Risk of infection, nerve injury, tendon injury, hypopigmentation and dermal atrophy were discussed.  Allergies: Dilaudid  [hydromorphone ], Levofloxacin, Macrobid [nitrofurantoin macrocrystal], Oxycodone-acetaminophen , Penicillin g benzathine, Seasonal ic [cholestatin], Cyclobenzaprine, Penicillins, Percocet [oxycodone-acetaminophen ], and Zanaflex  [tizanidine hcl]   Assessment / Plan:     Visit Diagnoses: Fibromyalgia-she continues to have generalized pain and discomfort.  She had positive tender points.  She reports increased bilateral trapezius spasm.  Other insomnia -she is on trazodone  50 mg 1 tablet by mouth at bedtime for insomnia.  Chronic fatigue-continues to have fatigue.  Trapezius muscle spasm -she continues to  have pain and discomfort in the trapezius region.  She had good response to previous trigger point injections.  Patient states she has tried all over-the-counter products and NSAIDs without much relief.  She requested to bilateral trapezius injection.  After informed consent was obtained and side effects were discussed bilateral trapezius region were injected with lidocaine  and Kenalog  as described above.  She tolerated the procedure well.  Postprocedure instructions given.  Plan: Trigger Point Inj  Trigger finger, right ring finger-obtain all symptomatic.  Pain in left hip-she good range of motion.  Trochanteric bursitis, left hip-I given the stretches were discussed.  Status post right knee replacement-she continues to have some warmth and limited extension.  Primary osteoarthritis of left knee  Spondylosis of lumbar spine - Status post fusion.  She is followed by Dr. Gust.  She has been having increased pain.  Patient had recent MRI which showed loosening of the screws per patient.  She had prednisone  taper and Toradol  injection which gave her some relief.  She states her symptoms are coming back.  She will have follow-up with the spine specialist.  History of hypertension-blood pressure was normal today at 117/70.  History of gastroesophageal reflux (GERD)  History of hypercholesterolemia  History of asthma  Orders: Orders Placed This Encounter  Procedures   Trigger Point Inj   No orders of the defined types were placed in this encounter.    Follow-Up Instructions: Return in about 6 months (around  12/29/2024) for FMS, OA.   Maya Nash, MD  Note - This record has been created using Animal nutritionist.  Chart creation errors have been sought, but may not always  have been located. Such creation errors do not reflect on  the standard of medical care.

## 2024-06-29 DIAGNOSIS — J301 Allergic rhinitis due to pollen: Secondary | ICD-10-CM | POA: Diagnosis not present

## 2024-06-29 DIAGNOSIS — J3089 Other allergic rhinitis: Secondary | ICD-10-CM | POA: Diagnosis not present

## 2024-06-29 DIAGNOSIS — J3081 Allergic rhinitis due to animal (cat) (dog) hair and dander: Secondary | ICD-10-CM | POA: Diagnosis not present

## 2024-06-30 ENCOUNTER — Ambulatory Visit: Attending: Rheumatology | Admitting: Rheumatology

## 2024-06-30 ENCOUNTER — Encounter: Payer: Self-pay | Admitting: Rheumatology

## 2024-06-30 VITALS — BP 117/70 | HR 73 | Temp 96.9°F | Ht 66.0 in | Wt 172.0 lb

## 2024-06-30 DIAGNOSIS — Z96651 Presence of right artificial knee joint: Secondary | ICD-10-CM

## 2024-06-30 DIAGNOSIS — G4709 Other insomnia: Secondary | ICD-10-CM | POA: Diagnosis not present

## 2024-06-30 DIAGNOSIS — M1712 Unilateral primary osteoarthritis, left knee: Secondary | ICD-10-CM

## 2024-06-30 DIAGNOSIS — Z8709 Personal history of other diseases of the respiratory system: Secondary | ICD-10-CM

## 2024-06-30 DIAGNOSIS — M65341 Trigger finger, right ring finger: Secondary | ICD-10-CM

## 2024-06-30 DIAGNOSIS — Z8679 Personal history of other diseases of the circulatory system: Secondary | ICD-10-CM

## 2024-06-30 DIAGNOSIS — M797 Fibromyalgia: Secondary | ICD-10-CM | POA: Diagnosis not present

## 2024-06-30 DIAGNOSIS — M62838 Other muscle spasm: Secondary | ICD-10-CM

## 2024-06-30 DIAGNOSIS — Z8719 Personal history of other diseases of the digestive system: Secondary | ICD-10-CM

## 2024-06-30 DIAGNOSIS — R5382 Chronic fatigue, unspecified: Secondary | ICD-10-CM | POA: Diagnosis not present

## 2024-06-30 DIAGNOSIS — M47816 Spondylosis without myelopathy or radiculopathy, lumbar region: Secondary | ICD-10-CM

## 2024-06-30 DIAGNOSIS — M7062 Trochanteric bursitis, left hip: Secondary | ICD-10-CM

## 2024-06-30 DIAGNOSIS — M25552 Pain in left hip: Secondary | ICD-10-CM

## 2024-06-30 DIAGNOSIS — Z8639 Personal history of other endocrine, nutritional and metabolic disease: Secondary | ICD-10-CM

## 2024-06-30 MED ORDER — TRIAMCINOLONE ACETONIDE 40 MG/ML IJ SUSP
20.0000 mg | INTRAMUSCULAR | Status: AC | PRN
  Administered 2024-06-30: 20 mg via INTRAMUSCULAR

## 2024-06-30 MED ORDER — LIDOCAINE HCL 1 % IJ SOLN
0.5000 mL | INTRAMUSCULAR | Status: AC | PRN
  Administered 2024-06-30: .5 mL

## 2024-06-30 MED ORDER — LIDOCAINE HCL 1 % IJ SOLN
0.5000 mL | INTRAMUSCULAR | Status: AC | PRN
Start: 2024-06-30 — End: 2024-06-30
  Administered 2024-06-30: .5 mL

## 2024-06-30 NOTE — Patient Instructions (Signed)
 Cervical Strain and Sprain Rehab Ask your health care provider which exercises are safe for you. Do exercises exactly as told by your health care provider and adjust them as directed. It is normal to feel mild stretching, pulling, tightness, or discomfort as you do these exercises. Stop right away if you feel sudden pain or your pain gets worse. Do not begin these exercises until told by your health care provider. Stretching and range-of-motion exercises Cervical side bending  Using good posture, sit on a stable chair or stand up. Without moving your shoulders, slowly tilt your left / right ear to your shoulder until you feel a stretch in the neck muscles on the opposite side. You should be looking straight ahead. Hold for __________ seconds. Repeat with the other side of your neck. Repeat __________ times. Complete this exercise __________ times a day. Cervical rotation  Using good posture, sit on a stable chair or stand up. Slowly turn your head to the side as if you are looking over your left / right shoulder. Keep your eyes level with the ground. Stop when you feel a stretch along the side and the back of your neck. Hold for __________ seconds. Repeat this by turning to your other side. Repeat __________ times. Complete this exercise __________ times a day. Thoracic extension and pectoral stretch  Roll a towel or a small blanket so it is about 4 inches (10 cm) in diameter. Lie down on your back on a firm surface. Put the towel in the middle of your back across your spine. It should not be under your shoulder blades. Put your hands behind your head and let your elbows fall out to your sides. Hold for __________ seconds. Repeat __________ times. Complete this exercise __________ times a day. Strengthening exercises Upper cervical flexion  Lie on your back with a thin pillow behind your head or a small, rolled-up towel under your neck. Gently tuck your chin toward your chest and nod  your head down to look toward your feet. Do not lift your head off the pillow. Hold for __________ seconds. Release the tension slowly. Relax your neck muscles completely before you repeat this exercise. Repeat __________ times. Complete this exercise __________ times a day. Cervical extension  Stand about 6 inches (15 cm) away from a wall, with your back facing the wall. Place a soft object, about 6-8 inches (15-20 cm) in diameter, between the back of your head and the wall. A soft object could be a small pillow, a ball, or a folded towel. Gently tilt your head back and press into the soft object. Keep your jaw and forehead relaxed. Hold for __________ seconds. Release the tension slowly. Relax your neck muscles completely before you repeat this exercise. Repeat __________ times. Complete this exercise __________ times a day. Posture and body mechanics Body mechanics refer to the movements and positions of your body while you do your daily activities. Posture is part of body mechanics. Good posture and healthy body mechanics can help to relieve stress in your body's tissues and joints. Good posture means that your spine is in its natural S-curve position (your spine is neutral), your shoulders are pulled back slightly, and your head is not tipped forward. The following are general guidelines for using improved posture and body mechanics in your everyday activities. Sitting  When sitting, keep your spine neutral and keep your feet flat on the floor. Use a footrest, if needed, and keep your thighs parallel to the floor. Avoid rounding  your shoulders. Avoid tilting your head forward. When working at a desk or a computer, keep your desk at a height where your hands are slightly lower than your elbows. Slide your chair under your desk so you are close enough to maintain good posture. When working at a computer, place your monitor at a height where you are looking straight ahead and you do not have to  tilt your head forward or downward to look at the screen. Standing  When standing, keep your spine neutral and keep your feet about hip-width apart. Keep a slight bend in your knees. Your ears, shoulders, and hips should line up. When you do a task in which you stand in one place for a long time, place one foot up on a stable object that is 2-4 inches (5-10 cm) high, such as a footstool. This helps keep your spine neutral. Resting When lying down and resting, avoid positions that are most painful for you. Try to support your neck in a neutral position. You can use a contour pillow or a small rolled-up towel. Your pillow should support your neck but not push on it. This information is not intended to replace advice given to you by your health care provider. Make sure you discuss any questions you have with your health care provider. Document Revised: 01/12/2023 Document Reviewed: 03/31/2022 Elsevier Patient Education  2024 [employer].

## 2024-07-06 DIAGNOSIS — Z23 Encounter for immunization: Secondary | ICD-10-CM | POA: Diagnosis not present

## 2024-07-11 DIAGNOSIS — Z01419 Encounter for gynecological examination (general) (routine) without abnormal findings: Secondary | ICD-10-CM | POA: Diagnosis not present

## 2024-07-11 DIAGNOSIS — J3089 Other allergic rhinitis: Secondary | ICD-10-CM | POA: Diagnosis not present

## 2024-07-11 DIAGNOSIS — N760 Acute vaginitis: Secondary | ICD-10-CM | POA: Diagnosis not present

## 2024-07-11 DIAGNOSIS — J301 Allergic rhinitis due to pollen: Secondary | ICD-10-CM | POA: Diagnosis not present

## 2024-07-11 DIAGNOSIS — Z6827 Body mass index (BMI) 27.0-27.9, adult: Secondary | ICD-10-CM | POA: Diagnosis not present

## 2024-07-11 DIAGNOSIS — Z1231 Encounter for screening mammogram for malignant neoplasm of breast: Secondary | ICD-10-CM | POA: Diagnosis not present

## 2024-07-11 DIAGNOSIS — J3081 Allergic rhinitis due to animal (cat) (dog) hair and dander: Secondary | ICD-10-CM | POA: Diagnosis not present

## 2024-07-18 ENCOUNTER — Other Ambulatory Visit: Payer: Self-pay | Admitting: Obstetrics & Gynecology

## 2024-07-18 DIAGNOSIS — R928 Other abnormal and inconclusive findings on diagnostic imaging of breast: Secondary | ICD-10-CM

## 2024-07-20 DIAGNOSIS — M7741 Metatarsalgia, right foot: Secondary | ICD-10-CM | POA: Diagnosis not present

## 2024-07-26 DIAGNOSIS — J3081 Allergic rhinitis due to animal (cat) (dog) hair and dander: Secondary | ICD-10-CM | POA: Diagnosis not present

## 2024-07-26 DIAGNOSIS — J3089 Other allergic rhinitis: Secondary | ICD-10-CM | POA: Diagnosis not present

## 2024-07-26 DIAGNOSIS — J301 Allergic rhinitis due to pollen: Secondary | ICD-10-CM | POA: Diagnosis not present

## 2024-07-28 DIAGNOSIS — M8588 Other specified disorders of bone density and structure, other site: Secondary | ICD-10-CM | POA: Diagnosis not present

## 2024-07-29 DIAGNOSIS — M545 Low back pain, unspecified: Secondary | ICD-10-CM | POA: Diagnosis not present

## 2024-07-29 DIAGNOSIS — Z981 Arthrodesis status: Secondary | ICD-10-CM | POA: Diagnosis not present

## 2024-07-29 DIAGNOSIS — M791 Myalgia, unspecified site: Secondary | ICD-10-CM | POA: Diagnosis not present

## 2024-07-29 DIAGNOSIS — G894 Chronic pain syndrome: Secondary | ICD-10-CM | POA: Diagnosis not present

## 2024-08-01 ENCOUNTER — Ambulatory Visit
Admission: RE | Admit: 2024-08-01 | Discharge: 2024-08-01 | Disposition: A | Discharge status: Home / Self Care | Source: Ambulatory Visit | Attending: Obstetrics & Gynecology | Admitting: Obstetrics & Gynecology

## 2024-08-01 ENCOUNTER — Ambulatory Visit

## 2024-08-01 DIAGNOSIS — R928 Other abnormal and inconclusive findings on diagnostic imaging of breast: Secondary | ICD-10-CM | POA: Diagnosis not present

## 2024-08-04 DIAGNOSIS — G8929 Other chronic pain: Secondary | ICD-10-CM | POA: Diagnosis not present

## 2024-08-04 DIAGNOSIS — Z981 Arthrodesis status: Secondary | ICD-10-CM | POA: Diagnosis not present

## 2024-08-04 DIAGNOSIS — M545 Low back pain, unspecified: Secondary | ICD-10-CM | POA: Diagnosis not present

## 2024-08-10 DIAGNOSIS — J3089 Other allergic rhinitis: Secondary | ICD-10-CM | POA: Diagnosis not present

## 2024-08-10 DIAGNOSIS — J3081 Allergic rhinitis due to animal (cat) (dog) hair and dander: Secondary | ICD-10-CM | POA: Diagnosis not present

## 2024-08-10 DIAGNOSIS — J301 Allergic rhinitis due to pollen: Secondary | ICD-10-CM | POA: Diagnosis not present

## 2024-08-11 NOTE — Progress Notes (Signed)
 [name]                                          MRN: [id number]   08/11/2024   The [facility] Quality Team Specialist reviewed this patient medical record for the purposes of chart review for care gap closure. The following were reviewed: abstraction for care gap closure-controlling blood pressure.    [facility] Quality Team

## 2024-12-29 ENCOUNTER — Ambulatory Visit: Admitting: Rheumatology
